# Patient Record
Sex: Male | Born: 1945 | Race: White | Hispanic: No | Marital: Married | Smoking: Never smoker
Health system: Southern US, Community
[De-identification: ages and names within clinical notes are randomized; demographics above are authoritative.]

## PROBLEM LIST (undated history)

## (undated) DIAGNOSIS — M25569 Pain in unspecified knee: Secondary | ICD-10-CM

## (undated) DIAGNOSIS — G4730 Sleep apnea, unspecified: Secondary | ICD-10-CM

## (undated) DIAGNOSIS — K469 Unspecified abdominal hernia without obstruction or gangrene: Secondary | ICD-10-CM

## (undated) DIAGNOSIS — I10 Essential (primary) hypertension: Secondary | ICD-10-CM

## (undated) DIAGNOSIS — I491 Atrial premature depolarization: Secondary | ICD-10-CM

## (undated) DIAGNOSIS — J45909 Unspecified asthma, uncomplicated: Secondary | ICD-10-CM

## (undated) DIAGNOSIS — K219 Gastro-esophageal reflux disease without esophagitis: Secondary | ICD-10-CM

## (undated) DIAGNOSIS — E785 Hyperlipidemia, unspecified: Secondary | ICD-10-CM

## (undated) DIAGNOSIS — G8929 Other chronic pain: Secondary | ICD-10-CM

## (undated) DIAGNOSIS — J302 Other seasonal allergic rhinitis: Secondary | ICD-10-CM

## (undated) DIAGNOSIS — G2581 Restless legs syndrome: Secondary | ICD-10-CM

## (undated) DIAGNOSIS — M1990 Unspecified osteoarthritis, unspecified site: Secondary | ICD-10-CM

## (undated) DIAGNOSIS — I2510 Atherosclerotic heart disease of native coronary artery without angina pectoris: Secondary | ICD-10-CM

## (undated) DIAGNOSIS — B029 Zoster without complications: Secondary | ICD-10-CM

## (undated) HISTORY — DX: Other seasonal allergic rhinitis: J30.2

## (undated) HISTORY — DX: Atrial premature depolarization: I49.1

## (undated) HISTORY — PX: KNEE SURGERY: SHX244

## (undated) HISTORY — DX: Hyperlipidemia, unspecified: E78.5

## (undated) HISTORY — DX: Other chronic pain: G89.29

## (undated) HISTORY — DX: Atherosclerotic heart disease of native coronary artery without angina pectoris: I25.10

## (undated) HISTORY — PX: JOINT REPLACEMENT: SHX530

## (undated) HISTORY — PX: CORONARY ANGIOPLASTY: SHX604

## (undated) HISTORY — PX: CARDIAC CATHETERIZATION: SHX172

## (undated) HISTORY — DX: Pain in unspecified knee: M25.569

## (undated) HISTORY — DX: Essential (primary) hypertension: I10

---

## 2004-04-24 ENCOUNTER — Ambulatory Visit (HOSPITAL_COMMUNITY): Admission: RE | Admit: 2004-04-24 | Discharge: 2004-04-24 | Payer: Self-pay | Admitting: Gastroenterology

## 2004-04-24 ENCOUNTER — Encounter (INDEPENDENT_AMBULATORY_CARE_PROVIDER_SITE_OTHER): Payer: Self-pay | Admitting: Specialist

## 2008-10-27 ENCOUNTER — Ambulatory Visit: Payer: Self-pay | Admitting: *Deleted

## 2008-10-27 ENCOUNTER — Inpatient Hospital Stay (HOSPITAL_COMMUNITY): Admission: EM | Admit: 2008-10-27 | Discharge: 2008-10-30 | Payer: Self-pay | Admitting: Emergency Medicine

## 2010-08-14 ENCOUNTER — Ambulatory Visit: Payer: Self-pay | Admitting: Cardiovascular Disease

## 2010-10-27 ENCOUNTER — Ambulatory Visit: Payer: Self-pay | Admitting: Cardiovascular Disease

## 2010-11-05 ENCOUNTER — Telehealth (INDEPENDENT_AMBULATORY_CARE_PROVIDER_SITE_OTHER): Payer: Self-pay | Admitting: Radiology

## 2010-11-06 ENCOUNTER — Ambulatory Visit: Payer: Self-pay | Admitting: Cardiovascular Disease

## 2010-11-06 ENCOUNTER — Encounter: Payer: Self-pay | Admitting: Cardiovascular Disease

## 2010-11-06 ENCOUNTER — Encounter (HOSPITAL_COMMUNITY)
Admission: RE | Admit: 2010-11-06 | Discharge: 2011-01-06 | Payer: Self-pay | Source: Home / Self Care | Attending: Cardiovascular Disease | Admitting: Cardiovascular Disease

## 2010-11-06 ENCOUNTER — Ambulatory Visit: Payer: Self-pay

## 2010-11-10 ENCOUNTER — Ambulatory Visit: Payer: Self-pay

## 2011-01-06 NOTE — Progress Notes (Signed)
Summary: nuc pre-procedure  Phone Note Outgoing Call   Call placed by: Domenic Polite, CNMT,  November 05, 2010 4:13 PM Call placed to: Patient Reason for Call: Confirm/change Appt Summary of Call: Left message with information on Myoview Information Sheet (see scanned document for details).      Nuclear Med Background Indications for Stress Test: Evaluation for Ischemia, Stent Patency, PTCA Patency   History: Heart Catheterization, Myocardial Perfusion Study, Stents  History Comments: '09 MPI Lat isch. EF=58%/ Cath/ Stents> LCFX  Symptoms: Chest Pain    Nuclear Pre-Procedure Cardiac Risk Factors: Family History - CAD, Lipids

## 2011-01-06 NOTE — Assessment & Plan Note (Signed)
Summary: Cardiology Nuclear Testing  Nuclear Med Background Indications for Stress Test: Evaluation for Ischemia, Stent Patency, PTCA Patency   History: Asthma, Heart Catheterization, Myocardial Perfusion Study, Stents  History Comments: '09 MPI Lat isch. EF=58%/ Cath/ Stents> LCFX  Symptoms: Chest Pain  Symptoms Comments: CP radiates to (R) shoulder-blade.   Nuclear Pre-Procedure Cardiac Risk Factors: Family History - CAD, Lipids Caffeine/Decaff Intake: None NPO After: 6:30 PM Lungs: Clear IV 0.9% NS with Angio Cath: 20g     IV Site: R Antecubital IV Started by: Irean Hong, RN Chest Size (in) 44     Height (in): 75 Weight (lb): 239 BMI: 29.98 Tech Comments: Held metoprolol 24 hrs. Metoprolol 50mg  1/2 tablet po given after exercise treadmill.  Nuclear Med Study 1 or 2 day study:  2 day     Stress Test Type:  Stress Reading MD:  Olga Millers, MD     Referring MD:  Kristeen Miss Resting Radionuclide:  Technetium 48m Tetrofosmin     Resting Radionuclide Dose:  33 mCi  Stress Radionuclide:  Technetium 33m Tetrofosmin     Stress Radionuclide Dose:  33 mCi   Stress Protocol Exercise Time (min):  7:16 min     Max HR:  148 bpm     Predicted Max HR:  154 bpm  Max Systolic BP: 247 mm Hg     Percent Max HR:  96.10 %     METS: 8.9 Rate Pressure Product:  08657    Stress Test Technologist:  Irean Hong,  RN     Nuclear Technologist:  Domenic Polite, CNMT  Rest Procedure  Myocardial perfusion imaging was performed at rest 45 minutes following the intravenous administration of Technetium 64m Tetrofosmin.  Stress Procedure  There is a RBBB on the baseline EKG today that wasn't present on the previous EKG. The patient exercised for 7 minutes and 16 seconds.  The patient stopped due to Marked HTN response. There were nonspecific  ST-T wave changes, rare PVC,PAC.  Technetium 53m Tetrofosmin was injected at peak exercise and myocardial perfusion imaging was performed after a brief  delay.  QPS Raw Data Images:  Normal; no motion artifact; normal heart/lung ratio. Stress Images:  Normal homogeneous uptake in all areas of the myocardium. Rest Images:  Normal homogeneous uptake in all areas of the myocardium. Subtraction (SDS):  Normal Transient Ischemic Dilatation:  0.87  (Normal <1.22)  Lung/Heart Ratio:  0.20  (Normal <0.45)  Quantitative Gated Spect Images QGS EDV:  105 ml QGS ESV:  40 ml QGS EF:  62 % QGS cine images:  normal  Findings Normal nuclear study      Overall Impression  Exercise Capacity: Fair exercise capacity. BP Response: Hypertensive blood pressure response. Clinical Symptoms: Dysnpnea ECG Impression: RBBB Overall Impression: Normal stress nuclear study.  Appended Document: Cardiology Nuclear Testing copy sent dr.nasher

## 2011-02-19 ENCOUNTER — Ambulatory Visit: Payer: Self-pay | Admitting: Cardiovascular Disease

## 2011-03-16 ENCOUNTER — Ambulatory Visit: Payer: Self-pay | Admitting: Cardiovascular Disease

## 2011-03-27 ENCOUNTER — Encounter: Payer: Self-pay | Admitting: *Deleted

## 2011-03-31 ENCOUNTER — Encounter: Payer: Self-pay | Admitting: Cardiovascular Disease

## 2011-03-31 ENCOUNTER — Ambulatory Visit (INDEPENDENT_AMBULATORY_CARE_PROVIDER_SITE_OTHER): Payer: Medicare Other | Admitting: Cardiovascular Disease

## 2011-03-31 VITALS — BP 132/90 | HR 68 | Ht 75.0 in | Wt 235.6 lb

## 2011-03-31 DIAGNOSIS — E785 Hyperlipidemia, unspecified: Secondary | ICD-10-CM | POA: Insufficient documentation

## 2011-03-31 DIAGNOSIS — I2510 Atherosclerotic heart disease of native coronary artery without angina pectoris: Secondary | ICD-10-CM

## 2011-03-31 MED ORDER — NITROGLYCERIN 0.4 MG SL SUBL: 0.4000 mg | SUBLINGUAL_TABLET | SUBLINGUAL | Status: DC | PRN

## 2011-03-31 NOTE — Assessment & Plan Note (Signed)
[name] is doing well from a cardiac standpoint. He's not had any episodes of angina. We will continue with his same medications.  I'll see him again in 6 months.

## 2011-03-31 NOTE — Progress Notes (Signed)
[name] Date of Birth  [birth year] [facility] / [facility] [address]     [address] [place], [place]  [address] [phone]  Fax  [phone]  History of Present Illness:  [name] is a middle-aged gentleman with a history of coronary artery disease. He status post PTCA and stenting of his ramus intermediate vessel in 2000 and. He's not had any episodes of angina. He works out on a treadmill everyday.  Current Outpatient Prescriptions on File Prior to Visit  Medication Sig Dispense Refill  . Ascorbic Acid (VITAMIN C) 500 MG tablet Take 500 mg by mouth once a week.       Marland Kitchen aspirin 81 MG tablet Take 81 mg by mouth daily.        . clopidogrel (PLAVIX) 75 MG tablet Take 75 mg by mouth daily.        . fish oil-omega-3 fatty acids 1000 MG capsule Take 2 g by mouth daily.        . metoprolol tartrate (LOPRESSOR) 25 MG tablet Take 25 mg by mouth 2 (two) times daily.        . nitroGLYCERIN (NITROSTAT) 0.4 MG SL tablet Place 0.4 mg under the tongue every 5 (five) minutes as needed.        . rosuvastatin (CRESTOR) 10 MG tablet Take 10 mg by mouth daily.       . naproxen sodium (ANAPROX) 220 MG tablet Take 220 mg by mouth 2 (two) times daily with a meal.          Allergies  Allergen Reactions  . Biaxin     Past Medical History  Diagnosis Date  . Coronary artery disease     PTCA/stenting ramus inter. vess.; 10/2008  . Hyperlipidemia   . Hypertension   . Chronic knee pain   . Seasonal allergies     Past Surgical History  Procedure Date  . Knee surgery     right and left  . Cardiac catheterization     10/30/2008;  PTCA/stent ramus interm. vess.    History  Smoking status  . Never Smoker   Smokeless tobacco  . Not on file    History  Alcohol Use  . Yes    No family history on file.  Reviw of Systems:  Reviewed in the HPI.  All other systems are negative.  Physical Exam: BP 132/90  Pulse 68  Ht 6\' 3"  (1.905 m)  Wt 235 lb 9.6 oz  (106.867 kg)  BMI 29.45 kg/m2 The patient is alert and oriented x 3.  The mood and affect are normal.  The skin is warm and dry.  Color is normal.  The HEENT exam reveals that the sclera are nonicteric.  The mucous membranes are moist.  The carotids are 2+ without bruits.  There is no thyromegaly.  There is no JVD.  The lungs are clear.  The chest wall is non tender.  The heart exam reveals a regular rate with a normal S1 and S2.  There are no murmurs, gallops, or rubs.  The PMI is not displaced.   Abdominal exam reveals good bowel sounds.  There is no guarding or rebound.  There is no hepatosplenomegaly or tenderness.  There are no masses.  Exam of the legs reveal no clubbing, cyanosis, or edema.  The legs are without rashes.  The distal pulses are intact.  Cranial nerves II - XII are intact.  Motor and sensory functions are intact.  The  gait is normal.  Assessment / Plan:

## 2011-03-31 NOTE — Assessment & Plan Note (Signed)
His most recent lipid levels have been  good. We'll check his lipids, basic metabolic profile, and hepatic profile in 6 months along with an office visit.

## 2011-04-21 NOTE — Discharge Summary (Signed)
NAMECYNTHIA, [name] NO.:  [id number]   MEDICAL RECORD NO.:  [id number]          PATIENT TYPE:  INP   LOCATION:  6524                         FACILITY:  [facility]   PHYSICIAN:  Vesta Mixer, M.D. DATE OF BIRTH:  [birth year]   DATE OF ADMISSION:  10/27/2008  DATE OF DISCHARGE:  10/30/2008                               DISCHARGE SUMMARY   DISCHARGE DIAGNOSES:  1. Coronary artery disease - status post percutaneous transluminal      coronary angioplasty and stenting of the ramus intermediate vessel.  2. Dyslipidemia.  3. Hypertension.   DISPOSITION:  The patient will see Dr. Elease Hashimoto in 1-2 weeks.  He is to  call for any further chest pains.   DISCHARGE MEDICATIONS:  1. Aspirin 81 mg a day.  2. Plavix 75 mg a day.  3. Hydrochlorothiazide 12.5 mg a day.  4. Lisinopril 10 mg a day.  5. Crestor 10 mg a day.  6. Metoprolol 25 mg twice daily.  7. Nitroglycerin 0.4 mg sublingually as needed facial once a day.   HISTORY:  1. [name] is a 65 year old gentleman who was recently seen for      episodes of chest pain.  He was originally scheduled for an      outpatient heart catheterization, but started having episodes of      chest discomfort.  He was admitted to the hospital.  He ruled out      for myocardial infarction.  He had heart catheterization, which      revealed a very tight ramus intermediate vessel.  A 2.5 x 18-cm      Promus stent was placed in the ramus intermediate vessel.  It was      postdilated using a 2.75 x 15 Quantum Monorail inflated up to 16      atmospheres.  He tolerated the procedure quite well and did not      have any further episodes of chest pain.  He will follow up with      Dr. Elease Hashimoto.  2. Knee pain.  The patient had a long history of chronic knee pain.      He had some knee stiffness after staying at      bed all day.  He normally sees Dr. Otelia Sergeant.  We will have him call      Dr. Otelia Sergeant in the next day or so for further evaluation.  He  will      not be able to have an MRI for the next 6 weeks because of the      recently placed stent.  I advised him to put some ice on his knee      and take some Motrin.      Vesta Mixer, M.D.  Electronically Signed     PJN/MEDQ  D:  10/30/2008  T:  10/30/2008  Job:  [id number]   cc:   Antony Madura, M.D.  Kerrin Champagne, M.D.

## 2011-04-21 NOTE — Cardiovascular Report (Signed)
NAME:  AVISH, TORRY NO.:  [id number]   MEDICAL RECORD NO.:  [id number]          PATIENT TYPE:  INP   LOCATION:  6524                         FACILITY:  [facility]   PHYSICIAN:  Vesta Mixer, M.D. DATE OF BIRTH:  18-Nov-1946   DATE OF PROCEDURE:  10/29/2008  DATE OF DISCHARGE:                            CARDIAC CATHETERIZATION   [name] is a middle-aged gentleman who was recently seen for  episodes of chest pain.  He had an abnormal Cardiolite study and was  scheduled for heart catheterization.  He had chest pain prior to being  admitted for his elective heart catheterization and was admitted for  further evaluation.  He ruled out for myocardial infarction.   The procedure was left heart catheterization with coronary angiography  with PTCA and stenting of the ramus intermediate vessel.   The right femoral artery was easily cannulated using a modified  Seldinger technique.   HEMODYNAMICS:  LV pressure is 136/8 with an aortic pressure of 140/95.   ANGIOGRAPHY:  Left main:  The left main is smooth and normal and is very  large.   The left anterior descending artery has a mild stenosis at its origin of  about 30%.  It does not appear to be flow obstructed.  There are minor  luminal irregularities throughout the LAD, but no critical stenosis.  It  gives off a moderate-to-large-sized first diagonal artery which is  essentially normal.  The distal LAD reaches the apex and is  unremarkable.   The ramus intermediate vessel is a moderate-sized vessel.  It has a 90%  eccentric stenosis at its midpoint.   The left circumflex artery is very large and is dominant.  There are  minor luminal irregularities.  There are no significant marginal  branches, but there is a large posterolateral branch which is normal.   The right coronary artery is small and is nondominant.   The left ventriculogram was performed in a 30 RAO position.  It reveals  normal left ventricular  systolic function with ejection fraction of 65%.   ANGIOPLASTY:  The 5-French sheath was traded out for 6-French sheath.  Angiomax was given IV.  The ACT was 333.  A Judkins left 4 guide was  positioned into the left main.  A Prowater angioplasty wire was placed  down into the distal ramus intermediate vessel.  A 2.25 x 15-mm apex  balloon was positioned across the stenosis and was inflated twice at 10  atmospheres.  The balloon was fully dilated, but this lesion stayed  mostly unchanged.  It was guessed that this was mostly an elastic  lesion.   At this point, a 2.5 x 18-mm Promus stent was positioned across the  stenosis.  It was deployed at 14 atmospheres for 20 seconds.   Post-stent dilatation was achieved using a 2.75 x 15-mm Quantum  Monorail.  It was positioned distally and inflated up to 16 atmospheres  and then pulled back proximally and inflated up to 16 atmospheres each  for about 20 seconds.  This gave [place] a very nice angiographic result.  There was  no residual stenosis.  There were no complications.  The  patient is stable leaving the cath lab.      Vesta Mixer, M.D.  Electronically Signed     PJN/MEDQ  D:  10/29/2008  T:  10/29/2008  Job:  [id number]

## 2011-04-21 NOTE — H&P (Signed)
NAME:  [name], [name] NO.:  [id number]   MEDICAL RECORD NO.:  [id number]          PATIENT TYPE:  INP   LOCATION:                               FACILITY:  [facility]   PHYSICIAN:  Jennelle Human. Marisue Humble, MD DATE OF BIRTH:  01-12-1946   DATE OF ADMISSION:  10/27/2008  DATE OF DISCHARGE:                              HISTORY & PHYSICAL   PRIMARY CARDIOLOGIST:  Vesta Mixer, M.D.   PRIMARY CARE PHYSICIAN:  Antony Madura, M.D.   CHIEF COMPLAINT:  Chest pain.   HISTORY OF PRESENT ILLNESS:  This is a 65 year old gentleman with a  history of hyperlipidemia who is a new patient of Dr. Harvie Bridge after he  presented to Dr. Elease Hashimoto with complaints of chest pain.  He recently just  had a myocardial perfusion study performed on October 24, 2008, which  was abnormal and he was thus scheduled for a left heart cath on Monday  at 11 o'clock.  Today, however, he began having some intermittent pain  that was across his chest that came on at night.  He notes that usually  his pain mostly does occur at night.  He described it as 7/10 in  intensity and a tightness all the way across his precordium.  It was  associated with shortness of breath and dizziness.  However, he had no  diaphoresis or nausea and vomiting.  He took a couple of nitroglycerins  at home which seemed to help, but did not completely relieve the pain.  When he arrived to the emergency department, he was given nitroglycerin  and Dilaudid in the ER and is currently chest pain free.   PAST MEDICAL HISTORY:  1. Hyperlipidemia.  2. Bilateral knee surgeries, not otherwise specified.   ALLERGIES:  He is allergic to some unknown antibiotics.   MEDICATIONS:  1. Fish oil 1200 mg daily.  2. Aspirin 81 mg daily.  3. Hydrochlorothiazide 12.5 mg daily.  4. Lisinopril 10 mg daily.  5. Simvastatin 20 mg daily.  6. Allegra 1 tablet daily.  7. Vitamin C 1 tablet daily.  8. Calcium 1 tablet daily.   SOCIAL HISTORY:  Lives in  [place] with his wife.  He has 2 grown  children.  No tobacco, but occasional alcohol use.  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  He  used to regularly exercise before he started having discomfort.  He  would swim approximately 1-3 miles per week.   FAMILY HISTORY:  Significant for his father having died from a  myocardial infarction at the age of 33.  His mother is 31 and healthy.  He has no siblings.   REVIEW OF SYSTEMS:  Negative x2 and except for that stated in the HPI.   PHYSICAL EXAMINATION:  VITAL SIGNS:  He is afebrile, pulse is 65,  respiratory rate is 18, and blood pressure is 118/80.  GENERAL:  Alert, awake, and oriented, in no acute distress.  HEENT:  Normal.  NECK:  No jugular venous distention.  No bruits, is otherwise supple.  CHEST:  Clear to auscultation bilaterally.  CARDIOVASCULAR:  Regular rate and rhythm without murmurs, gallops, or  rubs.  PMI is normal.  ABDOMEN:  Soft, nondistended, and nontender.  Good bowel sounds.  No  hepatosplenomegaly.  There is no hepatojugular reflux.  EXTREMITIES:  No clubbing, cyanosis, or edema.  Dorsalis pedis and  posterior tibial pulses are 2+.   Chest x-ray is within normal limits.   ECG shows normal sinus rhythm without any ST or T wave changes  consistent with ischemia.  There is an RSR prime pattern and V1  consistent with right ventricular conduction delay as the QRS duration  is less than 90 milliseconds.   LABORATORY EXAM:  Cardiac enzymes are negative x1.  White count is 9.8,  H&H is 14.9 and 42.9 respectively, platelets are 169.  Sodium is 139  with a potassium of 3.2, chloride 103, bicarb 25, BUN 17, creatinine  0.95, and glucose is 122.   ASSESSMENT AND PLAN:  1. Chest pain with a recently abnormal myocardial perfusion study.  We      will treat him for accelerating angina with a heparin drip,      aspirin, statin, the initiation of beta-blockade, and we will  place      1 inch of nitroglycerin paste, although he is currently pain free      at this time.  We will cycle cardiac enzymes and schedule him for a      cardiac catheterization on Monday as he is already scheduled as an      outpatient at 11 o'clock.  2. Hyperlipidemia.  We will follow fasting lipid panel in the morning.  3. Hypokalemia.  He is mildly hypokalemic at 3.2.  We will supplement      his potassium.      Jennelle Human Marisue Humble, MD  Electronically Signed     GBS/MEDQ  D:  10/27/2008  T:  10/27/2008  Job:  [id number]

## 2011-04-21 NOTE — H&P (Signed)
NAME:  [name], [name] NO.:  [id number]   MEDICAL RECORD NO.:  [id number]          PATIENT TYPE:  OUT   LOCATION:                               FACILITY:  [facility]   PHYSICIAN:  Vesta Mixer, M.D. DATE OF BIRTH:  [birth year]   DATE OF ADMISSION:  10/29/2008  DATE OF DISCHARGE:                              HISTORY & PHYSICAL   [name] is a 65 year old gentleman with a history of  allergies and hypercholesterolemia.  He recently saw me for chest pain  and had an abnormal stress Cardiolite study.  He is now admitted for  heart catheterization for further evaluation of this abnormal stress  Cardiolite study.   [name] has been in relatively good health.  He has been under a lot  of stress recently because he lost his job.  He started having some  episodes of chest pain.  The chest pain is intermittent.  It is  described as a tightness in his neck and radiates up to his shoulders.  Lasts for about 45 seconds to 1 minute and is typically relieved with  rest.  He has noticed that it seems to get a little bit worse with  exercise.  He has also had some dyspnea with exercise.  He denies any  syncope or presyncope.  He has tried a nitroglycerin, but it really did  not seem to help.   CURRENT MEDICATIONS:  1. Multivitamin once a day.  2. Vitamin E, C, and D once a day.  3. Zocor 20 mg a day.  4. Allegra once a day.  5. Lisinopril 10 mg a day starting yesterday.  6. Hydrochlorothiazide 12.5 mg a day.  7. Aspirin once a day starting yesterday.   He is allergic to [facility].   PAST MEDICAL HISTORY:  1. Hypercholesterolemia.  2. Recent chest pains with an abnormal stress Cardiolite study.   SOCIAL HISTORY:  The patient does not smoke.  He drinks some beer and  wine.  He drinks several caffeinated drinks a day.   FAMILY HISTORY:  Father died at age 16 due to COPD.  His father also had  angina.  His mother is 65 years old and lives in a nursing home.   REVIEW OF SYSTEMS:  Reviewed and is essentially negative except as noted  in the HPI.   PHYSICAL EXAMINATION:  GENERAL:  He is a middle-aged gentleman in no  acute distress.  He is alert and oriented x3 and his mood and affect are  normal.  VITAL SIGNS:  His weight is 238, blood pressure is 112/60 with a heart  rate of 78.  HEENT:  2+ carotids.  He has no bruits.  No JVD.  No thyromegaly.  NECK:  Supple.  There is no lymphadenopathy.  LUNGS:  Clear.  His chest expansion is normal.  HEART:  Regular rate.  S1 and S2.  He has no chest wall tenderness.  ABDOMINAL:  Good bowel sounds and is nontender.  He has no  hepatosplenomegaly.  EXTREMITIES:  He has good pulses.  He has no clubbing, cyanosis,  or  edema.  SKIN:  His skin turgor is normal.  His skin is without rashes.  His gait  is normal.   [name] presents with some episodes of chest discomfort and an abnormal  stress Cardiolite study suggesting lateral ischemia.  I have recommended  that we proceed with heart catheterization.  We will discuss the risks,  benefits, and options of heart catheterization.  He understands and  agrees to proceed.  We will schedule him for Monday, October 29, 2008.      Vesta Mixer, M.D.  Electronically Signed     PJN/MEDQ  D:  10/25/2008  T:  10/26/2008  Job:  [id number]   cc:   Antony Madura, M.D.

## 2011-04-24 NOTE — Op Note (Signed)
NAME:  [name], DOLLINS NO.:  [id number]   MEDICAL RECORD NO.:  [id number]                   PATIENT TYPE:  AMB   LOCATION:  ENDO                                 FACILITY:  [facility]   PHYSICIAN:  Graylin Shiver, M.D.                DATE OF BIRTH:  1946-10-06   DATE OF PROCEDURE:  04/24/2004  DATE OF DISCHARGE:                                 OPERATIVE REPORT   PROCEDURE:  Colonoscopy with biopsy.   INDICATIONS FOR PROCEDURE:  Screening.   CONSENT:  Informed consent was obtained after explanation of the risks of  bleeding, infection, and perforation.   PREMEDICATION:  Fentanyl 75 mcg IV, Versed 8 mg IV.   DESCRIPTION OF PROCEDURE:  With the patient in the left lateral decubitus  position, the rectal exam was performed.  No masses were felt.  The Olympus  colonoscope was inserted into the rectum and advanced around the colon to  the cecum.  Cecal landmarks were identified.  The cecum was normal.  In the  ascending colon there was a small 3 mm polyp, biopsied off with cold  forceps.  The transverse colon looked normal.  The descending colon looked  normal.  The sigmoid colon revealed two small 3 mm polyps, biopsied off with  cold forceps.  The rectum looked normal.  He tolerated the procedure well  without complications.   IMPRESSION:  Colon polyps.  211.3.   PLAN:  The pathology will be checked.                                               Graylin Shiver, M.D.    [name]  D:  04/24/2004  T:  04/24/2004  Job:  [id number]   cc:   Antony Madura, M.D.  [address]  [place]  [place] [address]  Fax: [phone]

## 2011-05-26 ENCOUNTER — Telehealth: Payer: Self-pay | Admitting: Cardiovascular Disease

## 2011-05-26 NOTE — Telephone Encounter (Signed)
Patient is concerned about his prescription he picked up yesterday. He thinks that he has the wrong medication.

## 2011-05-26 NOTE — Telephone Encounter (Signed)
Pt was given generic plavix, pt ok with it, liked the cheaper price.

## 2011-09-09 LAB — POCT CARDIAC MARKERS
Myoglobin, poc: 110
Troponin i, poc: <0.05

## 2011-09-09 LAB — LIPID PANEL
Cholesterol: 175
HDL: 43
Total CHOL/HDL Ratio: 4.1

## 2011-09-09 LAB — DIFFERENTIAL
Basophils Relative: 2 — ABNORMAL HIGH
Eosinophils Absolute: 0.4
Lymphocytes Relative: 40
Lymphs Abs: 4
Monocytes Absolute: 1.1 — ABNORMAL HIGH
Monocytes Relative: 11
Neutro Abs: 4.2
Neutrophils Relative %: 43

## 2011-09-09 LAB — CBC
HCT: 44
Hemoglobin: 14.4
Hemoglobin: 14.9
MCHC: 33.5
MCHC: 33.7
MCV: 94.3
MCV: 94.7
MCV: 95.4
RBC: 4.51
RBC: 4.59
RBC: 4.65
RDW: 13.3
WBC: 11.5 — ABNORMAL HIGH
WBC: 8.5
WBC: 9

## 2011-09-09 LAB — BASIC METABOLIC PANEL
BUN: 17
CO2: 23
Calcium: 9.2
Chloride: 103
Chloride: 107
Creatinine, Ser: 0.95
GFR calc Af Amer: >60
GFR calc Af Amer: >60
GFR calc non Af Amer: >60
Sodium: 136
Sodium: 137

## 2011-09-09 LAB — HEPARIN LEVEL (UNFRACTIONATED)
Heparin Unfractionated: 0.71 — ABNORMAL HIGH
Heparin Unfractionated: 0.89 — ABNORMAL HIGH

## 2011-09-09 LAB — CARDIAC PANEL(CRET KIN+CKTOT+MB+TROPI)
CK, MB: 6.2 — ABNORMAL HIGH
Relative Index: 3.1 — ABNORMAL HIGH
Troponin I: 0.01

## 2011-09-09 LAB — PROTIME-INR: INR: 1

## 2011-11-07 DIAGNOSIS — B029 Zoster without complications: Secondary | ICD-10-CM

## 2011-11-07 HISTORY — DX: Zoster without complications: B02.9

## 2011-12-09 ENCOUNTER — Other Ambulatory Visit: Payer: Self-pay | Admitting: *Deleted

## 2011-12-09 MED ORDER — METOPROLOL TARTRATE 25 MG PO TABS: 25.0000 mg | ORAL_TABLET | Freq: Two times a day (BID) | ORAL | Status: DC

## 2011-12-09 NOTE — Telephone Encounter (Signed)
Fax Received. Refill Completed. [name] (R.M.A)   

## 2012-01-12 ENCOUNTER — Ambulatory Visit (INDEPENDENT_AMBULATORY_CARE_PROVIDER_SITE_OTHER): Payer: Medicare Other | Admitting: Cardiovascular Disease

## 2012-01-12 ENCOUNTER — Encounter: Payer: Self-pay | Admitting: Cardiovascular Disease

## 2012-01-12 VITALS — BP 149/86 | HR 63 | Ht 75.0 in | Wt 240.0 lb

## 2012-01-12 DIAGNOSIS — E785 Hyperlipidemia, unspecified: Secondary | ICD-10-CM

## 2012-01-12 DIAGNOSIS — I2510 Atherosclerotic heart disease of native coronary artery without angina pectoris: Secondary | ICD-10-CM

## 2012-01-12 LAB — BASIC METABOLIC PANEL
BUN: 13 mg/dL (ref 6–23)
Calcium: 9.1 mg/dL (ref 8.4–10.5)
GFR: 70.18 mL/min (ref >60.00)
Potassium: 3.8 mEq/L (ref 3.5–5.1)

## 2012-01-12 LAB — HEPATIC FUNCTION PANEL
ALT: 31 U/L (ref 0–53)
AST: 28 U/L (ref 0–37)
Bilirubin, Direct: 0.1 mg/dL (ref 0.0–0.3)
Total Protein: 6.8 g/dL (ref 6.0–8.3)

## 2012-01-12 LAB — LIPID PANEL
Cholesterol: 171 mg/dL (ref 0–200)
VLDL: 23.8 mg/dL (ref 0.0–40.0)

## 2012-01-12 MED ORDER — ATORVASTATIN CALCIUM 20 MG PO TABS: 20.0000 mg | ORAL_TABLET | Freq: Every day | ORAL | Status: DC

## 2012-01-12 MED ORDER — NITROGLYCERIN 0.4 MG SL SUBL: 0.4000 mg | SUBLINGUAL_TABLET | SUBLINGUAL | Status: DC | PRN

## 2012-01-12 NOTE — Patient Instructions (Signed)
Your physician recommends that you return for a FASTING lipid profile: TODAY AND IN THREE MONTHS DUE TO NEW MEDICATION AND TO MAKE SURE NEW MEDICATION IS AGREEING WITH YOU.  Your physician wants you to follow-up in: 1 YEARYou will receive a reminder letter in the mail two months in advance. If you don't receive a letter, please call our office to schedule the follow-up appointment.  Your physician has recommended you make the following change in your medication:   STOP CRESTOR  START ATORVASTATIN 20 MG EACH EVENING

## 2012-01-12 NOTE — Progress Notes (Signed)
    [name] Date of Birth  [birth year] [facility]     [place] [facility]  [address]    [address]   [address] [place], [place]  [address]    [place], [place]  [address] [phone]  Fax  [phone]  [phone]  Fax [phone]   Problem list: 1. Coronary artery disease-status post 2.5 x 18 mm Promus stent positioned in the ramus intermediate vessel (10/29/08) 2. Hyperlipidemia 3. RBBB  History of Present Illness:   [name] is a 66 yo with a hx of CAD.  He is exercising regularly.  No angina  Current Outpatient Prescriptions on File Prior to Visit  Medication Sig Dispense Refill  . Ascorbic Acid (VITAMIN C) 500 MG tablet Take 500 mg by mouth once a week.       Marland Kitchen aspirin 81 MG tablet Take 81 mg by mouth daily.        . clopidogrel (PLAVIX) 75 MG tablet Take 75 mg by mouth daily.        Marland Kitchen Fexofenadine HCl (ALLEGRA PO) Take by mouth daily.        . fish oil-omega-3 fatty acids 1000 MG capsule Take 2 g by mouth daily.        . metoprolol tartrate (LOPRESSOR) 25 MG tablet Take 1 tablet (25 mg total) by mouth 2 (two) times daily.  60 tablet  2  . nitroGLYCERIN (NITROSTAT) 0.4 MG SL tablet Place 1 tablet (0.4 mg total) under the tongue every 5 (five) minutes as needed.  25 tablet  12  . rosuvastatin (CRESTOR) 10 MG tablet Take 10 mg by mouth daily.         Allergies  Allergen Reactions  . Biaxin     Past Medical History  Diagnosis Date  . Coronary artery disease     PTCA/stenting ramus inter. vess.; 10/2008  . Hyperlipidemia   . Hypertension   . Chronic knee pain   . Seasonal allergies     Past Surgical History  Procedure Date  . Knee surgery     right and left  . Cardiac catheterization     10/30/2008;  PTCA/stent ramus interm. vess.    History  Smoking status  . Never Smoker   Smokeless tobacco  . Not on file    History  Alcohol Use  . Yes    No family history on file.  Reviw of Systems:  Reviewed in the HPI.  All other  systems are negative.  Physical Exam: Blood pressure 149/86, pulse 63, height 6\' 3"  (1.905 m), weight 240 lb (108.863 kg). General: Well developed, well nourished, in no acute distress.  Head: Normocephalic, atraumatic, sclera non-icteric, mucus membranes are moist,   Neck: Supple. Negative for carotid bruits. JVD not elevated.  Lungs: Clear bilaterally to auscultation without wheezes, rales, or rhonchi. Breathing is unlabored.  Heart: RRR with S1 S2. No murmurs, rubs, or gallops appreciated.  Abdomen: Soft, non-tender, non-distended with normoactive bowel sounds. No hepatomegaly. No rebound/guarding. No obvious abdominal masses.  Msk:  Strength and tone appear normal for age.  Extremities: No clubbing or cyanosis. No edema.  Distal pedal pulses are 2+ and equal bilaterally.  Neuro: Alert and oriented X 3. Moves all extremities spontaneously.  Psych:  Responds to questions appropriately with a normal affect.  ECG: Sinus rhythm. He has a right bundle branch block. There are no significant changes from his previous EKGs.  Assessment / Plan:

## 2012-01-12 NOTE — Assessment & Plan Note (Signed)
[name] is doing very well. We'll continue with his same medications. Please is not having any episodes of angina. He's exercising on a fairly regular basis.

## 2012-01-13 ENCOUNTER — Telehealth: Payer: Self-pay | Admitting: Cardiovascular Disease

## 2012-01-13 NOTE — Telephone Encounter (Signed)
New Problem:     Patient was switched form Crestor to atorvastatin (LIPITOR) 20 MG tablet and he would like to kniow when he should start taking his Lipitor.  He would also like to know what to do with the rest of his recent refill of his Crestor. Please call back after 12:30-1:00pm.

## 2012-01-13 NOTE — Assessment & Plan Note (Signed)
His most recent cholesterol levels are still mildly elevated.  Will increase his Lipitor to 40 mg a day

## 2012-01-13 NOTE — Telephone Encounter (Signed)
Pt will stay on Crestor for a couple more days until he is able to get the new med filled.  He was notified that pharmacies do not take returns on medication.

## 2012-01-18 ENCOUNTER — Other Ambulatory Visit: Payer: Self-pay | Admitting: *Deleted

## 2012-01-18 ENCOUNTER — Telehealth: Payer: Self-pay | Admitting: *Deleted

## 2012-01-18 MED ORDER — CLOPIDOGREL BISULFATE 75 MG PO TABS: 75.0000 mg | ORAL_TABLET | Freq: Every day | ORAL | Status: DC

## 2012-01-18 NOTE — Telephone Encounter (Signed)
PT EXPLAINED HE HAS NOT SWITCHED HIS CHOL MEDS YET, HE IS CONTINUING HIS CRESTOR 10 MG TILL SHIPMENT OF LIPITOR 20 MG ARRIVES. NO MED CHANGE MADE YET, LAB REDRAW SET FOR 04/2012,  DR NAHSER INFORMED.

## 2012-01-18 NOTE — Telephone Encounter (Signed)
F/U  Patient returning Nurse call regarding Crestor questions, please return call to patient at [phone]

## 2012-01-18 NOTE — Telephone Encounter (Signed)
Message copied by Antony Odea on Mon Jan 18, 2012  4:52 PM ------      Message from: Alta, Deloris Ping      Created: Tue Jan 12, 2012  7:26 PM       LDL is a bit elevated. Continue to work on diet. Increase lipitor to 40 mg a day

## 2012-03-07 ENCOUNTER — Other Ambulatory Visit: Payer: Self-pay | Admitting: *Deleted

## 2012-03-07 MED ORDER — METOPROLOL TARTRATE 25 MG PO TABS: 25.0000 mg | ORAL_TABLET | Freq: Two times a day (BID) | ORAL | Status: DC

## 2012-03-07 NOTE — Telephone Encounter (Signed)
Fax Received. Refill Completed. Jae Skeet Chowoe (R.M.A)   

## 2012-03-08 ENCOUNTER — Other Ambulatory Visit: Payer: Self-pay | Admitting: *Deleted

## 2012-04-08 ENCOUNTER — Telehealth: Payer: Self-pay | Admitting: Cardiovascular Disease

## 2012-04-08 NOTE — Telephone Encounter (Signed)
New msg Pt called to say that he had blood work at Dr Burton Apley office [phone]. He canceled bw here for [id number]

## 2012-04-08 NOTE — Telephone Encounter (Signed)
Will forward to Dr. Harvie Bridge nurse for Lorain Childes

## 2012-04-11 ENCOUNTER — Other Ambulatory Visit: Payer: Self-pay | Admitting: *Deleted

## 2012-04-11 NOTE — Telephone Encounter (Signed)
noted 

## 2012-04-12 ENCOUNTER — Other Ambulatory Visit: Payer: Self-pay | Admitting: *Deleted

## 2012-04-12 ENCOUNTER — Other Ambulatory Visit: Payer: Medicare Other

## 2012-04-12 NOTE — Telephone Encounter (Signed)
Opened in Error.

## 2012-05-05 ENCOUNTER — Telehealth: Payer: Self-pay | Admitting: Cardiovascular Disease

## 2012-05-05 NOTE — Telephone Encounter (Signed)
Pt having knee surgery about the middle of august and he wants to talk to you about this and what he needs to do and also he needs to stop blood thinner 5day prior

## 2012-05-06 NOTE — Telephone Encounter (Signed)
PT NOT AT HOME, WILL TRY AGAIN LATER.

## 2012-05-06 NOTE — Telephone Encounter (Signed)
Informed pt the orthopaedic will request med to be stopped and we will fax

## 2012-05-06 NOTE — Telephone Encounter (Signed)
Patient returning nurse call he can be reached at [phone] or [phone]

## 2012-05-12 ENCOUNTER — Encounter: Payer: Self-pay | Admitting: *Deleted

## 2012-07-04 ENCOUNTER — Encounter (HOSPITAL_COMMUNITY): Payer: Self-pay | Admitting: Pharmacy Technician

## 2012-07-06 ENCOUNTER — Encounter (HOSPITAL_COMMUNITY)
Admission: RE | Admit: 2012-07-06 | Discharge: 2012-07-06 | Disposition: A | Discharge status: Home / Self Care | Payer: Medicare Other | Source: Ambulatory Visit | Attending: Orthopedic Surgery | Admitting: Orthopedic Surgery

## 2012-07-06 ENCOUNTER — Encounter (HOSPITAL_COMMUNITY): Payer: Self-pay

## 2012-07-06 ENCOUNTER — Ambulatory Visit (HOSPITAL_COMMUNITY)
Admission: RE | Admit: 2012-07-06 | Discharge: 2012-07-06 | Disposition: A | Discharge status: Home / Self Care | Payer: Medicare Other | Source: Ambulatory Visit | Attending: Orthopedic Surgery | Admitting: Orthopedic Surgery

## 2012-07-06 ENCOUNTER — Other Ambulatory Visit (HOSPITAL_COMMUNITY): Payer: Medicare Other

## 2012-07-06 DIAGNOSIS — G4730 Sleep apnea, unspecified: Secondary | ICD-10-CM

## 2012-07-06 DIAGNOSIS — Z01812 Encounter for preprocedural laboratory examination: Secondary | ICD-10-CM | POA: Insufficient documentation

## 2012-07-06 DIAGNOSIS — Z01818 Encounter for other preprocedural examination: Secondary | ICD-10-CM | POA: Insufficient documentation

## 2012-07-06 HISTORY — DX: Sleep apnea, unspecified: G47.30

## 2012-07-06 HISTORY — DX: Gastro-esophageal reflux disease without esophagitis: K21.9

## 2012-07-06 HISTORY — DX: Zoster without complications: B02.9

## 2012-07-06 HISTORY — DX: Unspecified asthma, uncomplicated: J45.909

## 2012-07-06 HISTORY — DX: Unspecified osteoarthritis, unspecified site: M19.90

## 2012-07-06 HISTORY — DX: Unspecified abdominal hernia without obstruction or gangrene: K46.9

## 2012-07-06 LAB — CBC
MCH: 32.2 pg (ref 26.0–34.0)
MCHC: 34.9 g/dL (ref 30.0–36.0)
MCV: 92.2 fL (ref 78.0–100.0)
Platelets: 153 10*3/uL (ref 150–400)
RDW: 13.2 % (ref 11.5–15.5)

## 2012-07-06 LAB — URINALYSIS, ROUTINE W REFLEX MICROSCOPIC
Bilirubin Urine: NEGATIVE
Glucose, UA: NEGATIVE mg/dL
Hgb urine dipstick: NEGATIVE
Ketones, ur: NEGATIVE mg/dL
Protein, ur: NEGATIVE mg/dL

## 2012-07-06 LAB — BASIC METABOLIC PANEL
CO2: 27 mEq/L (ref 19–32)
Calcium: 9.5 mg/dL (ref 8.4–10.5)
Creatinine, Ser: 1.02 mg/dL (ref 0.50–1.35)
GFR calc non Af Amer: 74 mL/min — ABNORMAL LOW (ref >90)
Glucose, Bld: 97 mg/dL (ref 70–99)

## 2012-07-06 LAB — SURGICAL PCR SCREEN: Staphylococcus aureus: NEGATIVE

## 2012-07-06 NOTE — Patient Instructions (Addendum)
YOUR SURGERY IS SCHEDULED ON:  Tuesday  8/20  AT 7:15 AM  REPORT TO [facility] AT:  5:15 AM      PHONE # FOR SHORT STAY IS [phone]  DO NOT EAT OR DRINK ANYTHING AFTER MIDNIGHT THE NIGHT BEFORE YOUR SURGERY.  YOU MAY BRUSH YOUR TEETH, RINSE OUT YOUR MOUTH--BUT NO WATER, NO FOOD, NO CHEWING GUM, NO MINTS, NO CANDIES, NO CHEWING TOBACCO.  PLEASE TAKE THE FOLLOWING MEDICATIONS THE AM OF YOUR SURGERY WITH A FEW SIPS OF WATER:    METOPROLOL, BENADRYL,  BRING NITROGLYCERIN    IF YOU USE INHALERS--USE YOUR INHALERS THE AM OF YOUR SURGERY AND BRING INHALERS TO THE HOSPITAL -TAKE TO SURGERY.    IF YOU ARE DIABETIC:  DO NOT TAKE ANY DIABETIC MEDICATIONS THE AM OF YOUR SURGERY.  IF YOU TAKE INSULIN IN THE EVENINGS--PLEASE ONLY TAKE 1/2 NORMAL EVENING DOSE THE NIGHT BEFORE YOUR SURGERY.  NO INSULIN THE AM OF YOUR SURGERY.  IF YOU HAVE SLEEP APNEA AND USE CPAP OR BIPAP--PLEASE BRING THE MASK --NOT THE MACHINE-NOT THE TUBING   -JUST THE MASK. DO NOT BRING VALUABLES, MONEY, CREDIT CARDS.  CONTACT LENS, DENTURES / PARTIALS, GLASSES SHOULD NOT BE WORN TO SURGERY AND IN MOST CASES-HEARING AIDS WILL NEED TO BE REMOVED.  BRING YOUR GLASSES CASE, ANY EQUIPMENT NEEDED FOR YOUR CONTACT LENS. FOR PATIENTS ADMITTED TO THE HOSPITAL--CHECK OUT TIME THE DAY OF DISCHARGE IS 11:00 AM.  ALL INPATIENT ROOMS ARE PRIVATE - WITH BATHROOM, TELEPHONE, TELEVISION AND WIFI INTERNET. IF YOU ARE BEING DISCHARGED THE SAME DAY OF YOUR SURGERY--YOU CAN NOT DRIVE YOURSELF HOME--AND SHOULD NOT GO HOME ALONE BY TAXI OR BUS.  NO DRIVING OR OPERATING MACHINERY FOR 24 HOURS FOLLOWING ANESTHESIA / PAIN MEDICATIONS.                            SPECIAL INSTRUCTIONS:  CHLORHEXIDINE SOAP SHOWER (other brand names are Betasept and Hibiclens ) PLEASE SHOWER WITH CHLORHEXIDINE THE NIGHT BEFORE YOUR SURGERY AND THE AM OF YOUR SURGERY. DO NOT USE CHLORHEXIDINE ON YOUR FACE OR PRIVATE AREAS--YOU MAY USE YOUR NORMAL SOAP THOSE AREAS  AND YOUR NORMAL SHAMPOO.  WOMEN SHOULD AVOID SHAVING UNDER ARMS AND SHAVING LEGS 48 HOURS BEFORE USING CHLORHEXIDINE TO AVOID SKIN IRRITATION.  DO NOT USE IF ALLERGIC TO CHLORHEXIDINE.  PLEASE READ OVER ANY  FACT SHEETS THAT YOU WERE GIVEN: MRSA INFORMATION, BLOOD TRANSFUSION INFORMATION, INCENTIVE SPIROMETER INFORMATION.   PT CALLED AND NOTIFIED SURGERY TIME IS NOW 9:00 AM --PLEASE ARRIVE BY 6:30 AM.

## 2012-07-06 NOTE — Pre-Procedure Instructions (Signed)
CBC, BMET, PT, PTT, UA AND CXR -WERE DONE TODAY - PREOP - AT [facility].  T/S WILL BE DRAWN THE DAY OF SURGERY. PT HAS EKG REPORT 01/12/12 AND CARDIOLOGY OFFICE NOTE FROM DR. NAHSER -REPORTS IN EPIC. PREOP TEACHING WAS DONE USING TEACH BACK METHOD.

## 2012-07-06 NOTE — Progress Notes (Signed)
07/06/12 1200  OBSTRUCTIVE SLEEP APNEA  Have you ever been diagnosed with sleep apnea through a sleep study? No  Do you snore loudly (loud enough to be heard through closed doors)?  1  Do you often feel tired, fatigued, or sleepy during the daytime? 0  Has anyone observed you stop breathing during your sleep? 0  Do you have, or are you being treated for high blood pressure? 1  BMI more than 35 kg/m2? 0  Age over 68 years old? 1  Neck circumference greater than 40 cm/18 inches? 0  Gender: 1  Obstructive Sleep Apnea Score 4   Score 4 or greater  Updated health history

## 2012-07-07 ENCOUNTER — Telehealth: Payer: Self-pay | Admitting: *Deleted

## 2012-07-07 NOTE — Telephone Encounter (Signed)
Cardiac clearance signed and faxed back to [facility] for LTKA, pt can hold plavix 5 days prior.

## 2012-07-13 ENCOUNTER — Other Ambulatory Visit: Payer: Self-pay | Admitting: Cardiovascular Disease

## 2012-07-13 NOTE — Progress Notes (Signed)
H&P performed 07/13/12 Dictation # [id number]

## 2012-07-13 NOTE — Telephone Encounter (Signed)
Fax Received. Refill Completed. [name] (R.M.A)   

## 2012-07-14 NOTE — H&P (Signed)
[name], [name] NO.:  [id number]  MEDICAL RECORD NO.:  [id number]  LOCATION:                               FACILITY:  [facility]  PHYSICIAN:  Madlyn Frankel. Charlann Boxer, M.D.  DATE OF BIRTH:  1946-03-01  DATE OF ADMISSION:  07/26/2012 DATE OF DISCHARGE:                             HISTORY & PHYSICAL   ADMITTING DIAGNOSIS:  End-stage osteoarthritis, left knee.  HISTORY OF PRESENT ILLNESS:  This is a 66 year old gentleman with a history of end-stage osteoarthritis of his left knee that has failed conservative treatment.  After discussion of treatment, benefits, risks, and options, the patient is now scheduled for total knee arthroplasty of the left knee.  Note that he is not a candidate for tranexamic acid, but is a candidate for dexamethasone.  His primary care doctor is Dr. Burton Apley.  His cardiologist is Dr. Elease Hashimoto.  He does plan on going home after surgery.  He is given his home medicines today of Robaxin, iron, Colace, and MiraLAX and he will go back on his Plavix and aspirin postoperatively for his DVT prophylaxis.  The surgery, risks, benefits, and aftercare were discussed in detail with the patient.  Questions invited and answered.  PAST MEDICAL HISTORY:  Drug allergy to CLARITHROMYCIN with drowsiness and lethargy.  CURRENT MEDICATIONS: 1. Metoprolol 25 mg b.i.d. 2. Atorvastatin 20 mg daily. 3. Clopidogrel 75 mg daily. 4. Fish oil 1000 mg daily. 5. Bayer aspirin 100 mg daily. 6. Benadryl 25 mg at bedtime.  SERIOUS MEDICAL ILLNESSES:  Include coronary artery disease with heart stent in 2009, hypertension, history of asthma as a child.  No history as an adult and osteoarthritis.  PREVIOUS SURGERIES:  Include left and right knee arthrotomies for torn meniscus.  FAMILY HISTORY:  Positive for coronary artery disease.  SOCIAL HISTORY:  The patient is married.  He does not smoke.  He drinks 5 beers a week and 2 glasses of wine a week and plans on going  home after surgery.  REVIEW OF SYSTEMS:  CENTRAL NERVOUS SYSTEM:  Negative for headache, blurred vision, or dizziness.  PULMONARY:  Negative for shortness breath, PND, orthopnea.  CARDIOVASCULAR:  Positive for history of coronary artery disease with stents.  Negative for angina.  GI: Negative for ulcers, hepatitis.  GU:  Negative for urinary tract difficulty.  MUSCULOSKELETAL:  Positive as in HPI.  PHYSICAL EXAMINATION:  VITAL SIGNS:  BP today 132/88, pulse 64 and regular, respirations 14. HEENT:  Head normocephalic.  Nose patent.  Ears patent.  Pupils equal, round, and reactive to light.  Throat without injection. NECK:  Supple without adenopathy.  Carotids 2+ without bruit. CHEST:  Clear to auscultation.  No rales or rhonchi.  Respirations 14. HEART:  Regular rate and rhythm at 64 beats per minute without murmur. ABDOMEN:  Soft with active bowel sounds.  No masses or organomegaly. NEUROLOGIC:  The patient is alert and oriented to time, place, and person.  Cranial nerves II-XII grossly intact. EXTREMITIES:  Show both knees with 1+ effusion.  Varus deformity. Sensation and circulation are intact.  Range of motion of the left knee is 3 degrees from full extension with further flexion to 115  degrees. Crepitation throughout the range of motion and again neurovascular status intact.  IMPRESSION:  End-stage osteoarthritis, left knee.  PLAN:  Total knee arthroplasty, left knee.     Jaquelyn Bitter. Rony Ratz, P.A.   ______________________________ Madlyn Frankel Charlann Boxer, M.D.    SJC/MEDQ  D:  07/13/2012  T:  07/14/2012  Job:  [id number]

## 2012-07-18 ENCOUNTER — Telehealth: Payer: Self-pay | Admitting: Cardiovascular Disease

## 2012-07-18 NOTE — Telephone Encounter (Signed)
Let a message to call back

## 2012-07-18 NOTE — Telephone Encounter (Signed)
New problem:  Per Clydie Braun calling checking on status of cardiac clearance  Surgery on 8/20. Knee replacement .

## 2012-07-18 NOTE — Telephone Encounter (Signed)
Follow-up:   Called in needing the patient's medical clearance or office note faxed over to [phone].

## 2012-07-18 NOTE — Telephone Encounter (Signed)
Letter for surgical clearance faxed again to [phone].

## 2012-07-25 NOTE — Pre-Procedure Instructions (Signed)
PT'S NOTE OF CARDIAC CLEARANCE FOR LEFT TKA ON CHART FROM DR. NAHSER -FAXED BY DR. Nilsa Nutting OFFICE.

## 2012-07-26 ENCOUNTER — Encounter (HOSPITAL_COMMUNITY): Payer: Self-pay | Admitting: Anesthesiology

## 2012-07-26 ENCOUNTER — Inpatient Hospital Stay (HOSPITAL_COMMUNITY)
Admission: RE | Admit: 2012-07-26 | Discharge: 2012-07-27 | DRG: 470 | Disposition: A | Discharge status: Home Health | Payer: Medicare Other | Source: Ambulatory Visit | Attending: Orthopedic Surgery | Admitting: Orthopedic Surgery

## 2012-07-26 ENCOUNTER — Encounter (HOSPITAL_COMMUNITY)
Admission: RE | Disposition: A | Discharge status: Home Health | Payer: Self-pay | Source: Ambulatory Visit | Attending: Orthopedic Surgery

## 2012-07-26 ENCOUNTER — Ambulatory Visit (HOSPITAL_COMMUNITY): Payer: Medicare Other | Admitting: Anesthesiology

## 2012-07-26 ENCOUNTER — Encounter (HOSPITAL_COMMUNITY): Payer: Self-pay | Admitting: *Deleted

## 2012-07-26 DIAGNOSIS — Z96659 Presence of unspecified artificial knee joint: Secondary | ICD-10-CM

## 2012-07-26 DIAGNOSIS — M1710 Unilateral primary osteoarthritis, unspecified knee: Principal | ICD-10-CM | POA: Diagnosis present

## 2012-07-26 DIAGNOSIS — I2510 Atherosclerotic heart disease of native coronary artery without angina pectoris: Secondary | ICD-10-CM | POA: Diagnosis present

## 2012-07-26 DIAGNOSIS — Z9861 Coronary angioplasty status: Secondary | ICD-10-CM

## 2012-07-26 DIAGNOSIS — I10 Essential (primary) hypertension: Secondary | ICD-10-CM | POA: Diagnosis present

## 2012-07-26 HISTORY — PX: TOTAL KNEE ARTHROPLASTY: SHX125

## 2012-07-26 LAB — ABO/RH: ABO/RH(D): A POS

## 2012-07-26 LAB — TYPE AND SCREEN: ABO/RH(D): A POS

## 2012-07-26 SURGERY — ARTHROPLASTY, KNEE, TOTAL
Anesthesia: Spinal | Site: Knee | Laterality: Left | Wound class: Clean

## 2012-07-26 MED ORDER — POLYETHYLENE GLYCOL 3350 17 G PO PACK
17.0000 g | PACK | Freq: Two times a day (BID) | ORAL | Status: DC
  Administered 2012-07-26 – 2012-07-27 (×3): 17 g via ORAL
  Filled 2012-07-26 (×4): qty 1

## 2012-07-26 MED ORDER — DEXAMETHASONE SODIUM PHOSPHATE 10 MG/ML IJ SOLN
10.0000 mg | Freq: Once | INTRAMUSCULAR | Status: AC
  Administered 2012-07-27: 10 mg via INTRAVENOUS
  Filled 2012-07-26: qty 1

## 2012-07-26 MED ORDER — KETAMINE HCL 10 MG/ML IJ SOLN
INTRAMUSCULAR | Status: DC | PRN
  Administered 2012-07-26 (×2): 10 mg via INTRAVENOUS
  Administered 2012-07-26: 1 mg via INTRAVENOUS
  Administered 2012-07-26 (×2): 5 mg via INTRAVENOUS
  Administered 2012-07-26: 10 mg via INTRAVENOUS
  Administered 2012-07-26: 1 mg via INTRAVENOUS
  Administered 2012-07-26 (×2): 10 mg via INTRAVENOUS

## 2012-07-26 MED ORDER — DIPHENHYDRAMINE HCL 25 MG PO TABS: 25.0000 mg | ORAL_TABLET | Freq: Every morning | ORAL | Status: DC

## 2012-07-26 MED ORDER — ONDANSETRON HCL 4 MG/2ML IJ SOLN
INTRAMUSCULAR | Status: DC | PRN
  Administered 2012-07-26: 4 mg via INTRAVENOUS

## 2012-07-26 MED ORDER — LACTATED RINGERS IV SOLN
INTRAVENOUS | Status: DC
  Administered 2012-07-26: 1000 mL via INTRAVENOUS
  Administered 2012-07-26 (×2): via INTRAVENOUS

## 2012-07-26 MED ORDER — FLEET ENEMA 7-19 GM/118ML RE ENEM
1.0000 | ENEMA | Freq: Once | RECTAL | Status: AC | PRN
  Filled 2012-07-26: qty 1

## 2012-07-26 MED ORDER — DIPHENHYDRAMINE HCL 25 MG PO CAPS
25.0000 mg | ORAL_CAPSULE | Freq: Every day | ORAL | Status: DC
  Administered 2012-07-27: 25 mg via ORAL
  Filled 2012-07-26 (×3): qty 1

## 2012-07-26 MED ORDER — DEXAMETHASONE SODIUM PHOSPHATE 10 MG/ML IJ SOLN
INTRAMUSCULAR | Status: DC | PRN
  Administered 2012-07-26: 10 mg via INTRAVENOUS

## 2012-07-26 MED ORDER — ONDANSETRON HCL 4 MG/2ML IJ SOLN: 4.0000 mg | Freq: Four times a day (QID) | INTRAMUSCULAR | Status: DC | PRN

## 2012-07-26 MED ORDER — DOCUSATE SODIUM 100 MG PO CAPS
100.0000 mg | ORAL_CAPSULE | Freq: Two times a day (BID) | ORAL | Status: DC
  Administered 2012-07-26 – 2012-07-27 (×3): 100 mg via ORAL
  Filled 2012-07-26 (×4): qty 1

## 2012-07-26 MED ORDER — ONDANSETRON HCL 4 MG PO TABS
4.0000 mg | ORAL_TABLET | Freq: Four times a day (QID) | ORAL | Status: DC | PRN
  Administered 2012-07-27: 4 mg via ORAL
  Filled 2012-07-26 (×2): qty 1

## 2012-07-26 MED ORDER — HYDROCODONE-ACETAMINOPHEN 7.5-325 MG PO TABS
1.0000 | ORAL_TABLET | ORAL | Status: DC
  Administered 2012-07-26: 2 via ORAL
  Administered 2012-07-26: 1 via ORAL
  Administered 2012-07-27 (×5): 2 via ORAL
  Filled 2012-07-26: qty 2
  Filled 2012-07-26: qty 1
  Filled 2012-07-26 (×5): qty 2

## 2012-07-26 MED ORDER — 0.9 % SODIUM CHLORIDE (POUR BTL) OPTIME
TOPICAL | Status: DC | PRN
  Administered 2012-07-26: 1000 mL

## 2012-07-26 MED ORDER — ACETAMINOPHEN 10 MG/ML IV SOLN
INTRAVENOUS | Status: DC | PRN
  Administered 2012-07-26: 1000 mg via INTRAVENOUS

## 2012-07-26 MED ORDER — SODIUM CHLORIDE 0.9 % IV SOLN
INTRAVENOUS | Status: DC
  Administered 2012-07-26: 18:00:00 via INTRAVENOUS
  Filled 2012-07-26 (×8): qty 1000

## 2012-07-26 MED ORDER — NITROGLYCERIN 0.4 MG SL SUBL: 0.4000 mg | SUBLINGUAL_TABLET | SUBLINGUAL | Status: DC | PRN

## 2012-07-26 MED ORDER — HYDROMORPHONE HCL PF 1 MG/ML IJ SOLN: 0.2500 mg | INTRAMUSCULAR | Status: DC | PRN

## 2012-07-26 MED ORDER — METOCLOPRAMIDE HCL 5 MG/ML IJ SOLN
INTRAMUSCULAR | Status: DC | PRN
  Administered 2012-07-26: 10 mg via INTRAVENOUS

## 2012-07-26 MED ORDER — BUPIVACAINE IN DEXTROSE 0.75-8.25 % IT SOLN
INTRATHECAL | Status: DC | PRN
  Administered 2012-07-26: 1.8 mL via INTRATHECAL

## 2012-07-26 MED ORDER — BISACODYL 10 MG RE SUPP
10.0000 mg | Freq: Every day | RECTAL | Status: DC | PRN
  Filled 2012-07-26: qty 1

## 2012-07-26 MED ORDER — DEXAMETHASONE SODIUM PHOSPHATE 10 MG/ML IJ SOLN: 10.0000 mg | Freq: Once | INTRAMUSCULAR | Status: DC

## 2012-07-26 MED ORDER — ACETAMINOPHEN 10 MG/ML IV SOLN
INTRAVENOUS | Status: AC
  Filled 2012-07-26: qty 100

## 2012-07-26 MED ORDER — METHOCARBAMOL 500 MG PO TABS
500.0000 mg | ORAL_TABLET | Freq: Four times a day (QID) | ORAL | Status: DC | PRN
  Administered 2012-07-27: 500 mg via ORAL
  Filled 2012-07-26: qty 1

## 2012-07-26 MED ORDER — MENTHOL 3 MG MT LOZG: 1.0000 | LOZENGE | OROMUCOSAL | Status: DC | PRN

## 2012-07-26 MED ORDER — ASPIRIN 81 MG PO CHEW
162.0000 mg | CHEWABLE_TABLET | Freq: Every day | ORAL | Status: DC
  Administered 2012-07-27: 162 mg via ORAL
  Filled 2012-07-26: qty 2

## 2012-07-26 MED ORDER — PHENYLEPHRINE HCL 10 MG/ML IJ SOLN
INTRAMUSCULAR | Status: DC | PRN
  Administered 2012-07-26 (×5): 40 ug via INTRAVENOUS

## 2012-07-26 MED ORDER — CELECOXIB 200 MG PO CAPS
200.0000 mg | ORAL_CAPSULE | Freq: Two times a day (BID) | ORAL | Status: DC
  Administered 2012-07-26 – 2012-07-27 (×3): 200 mg via ORAL
  Filled 2012-07-26 (×4): qty 1

## 2012-07-26 MED ORDER — MEPERIDINE HCL 50 MG/ML IJ SOLN: 6.2500 mg | INTRAMUSCULAR | Status: DC | PRN

## 2012-07-26 MED ORDER — DIPHENHYDRAMINE HCL 25 MG PO CAPS
25.0000 mg | ORAL_CAPSULE | Freq: Four times a day (QID) | ORAL | Status: DC | PRN
  Filled 2012-07-26 (×2): qty 1

## 2012-07-26 MED ORDER — CHLORHEXIDINE GLUCONATE 4 % EX LIQD: 60.0000 mL | Freq: Once | CUTANEOUS | Status: DC

## 2012-07-26 MED ORDER — LACTATED RINGERS IV SOLN: INTRAVENOUS | Status: DC

## 2012-07-26 MED ORDER — METHOCARBAMOL 100 MG/ML IJ SOLN
500.0000 mg | Freq: Four times a day (QID) | INTRAVENOUS | Status: DC | PRN
  Administered 2012-07-26: 500 mg via INTRAVENOUS
  Filled 2012-07-26 (×2): qty 5

## 2012-07-26 MED ORDER — PROPOFOL 10 MG/ML IV EMUL
INTRAVENOUS | Status: DC | PRN
  Administered 2012-07-26: 120 ug/kg/min via INTRAVENOUS

## 2012-07-26 MED ORDER — FERROUS SULFATE 325 (65 FE) MG PO TABS
325.0000 mg | ORAL_TABLET | Freq: Three times a day (TID) | ORAL | Status: DC
  Administered 2012-07-26 – 2012-07-27 (×3): 325 mg via ORAL
  Filled 2012-07-26 (×6): qty 1

## 2012-07-26 MED ORDER — PHENYLEPHRINE HCL 10 MG/ML IJ SOLN
10.0000 mg | INTRAVENOUS | Status: DC | PRN
  Administered 2012-07-26: 50 ug/min via INTRAVENOUS

## 2012-07-26 MED ORDER — BUPIVACAINE-EPINEPHRINE 0.25% -1:200000 IJ SOLN
INTRAMUSCULAR | Status: AC
  Filled 2012-07-26: qty 1

## 2012-07-26 MED ORDER — BUPIVACAINE-EPINEPHRINE PF 0.25-1:200000 % IJ SOLN
INTRAMUSCULAR | Status: DC | PRN
  Administered 2012-07-26: 50 mL

## 2012-07-26 MED ORDER — SODIUM CHLORIDE 0.9 % IR SOLN
Status: DC | PRN
  Administered 2012-07-26: 1000 mL

## 2012-07-26 MED ORDER — PHENOL 1.4 % MT LIQD: 1.0000 | OROMUCOSAL | Status: DC | PRN

## 2012-07-26 MED ORDER — ZOLPIDEM TARTRATE 5 MG PO TABS: 5.0000 mg | ORAL_TABLET | Freq: Every evening | ORAL | Status: DC | PRN

## 2012-07-26 MED ORDER — ATORVASTATIN CALCIUM 20 MG PO TABS
20.0000 mg | ORAL_TABLET | Freq: Every day | ORAL | Status: DC
  Administered 2012-07-26: 20 mg via ORAL
  Filled 2012-07-26 (×2): qty 1

## 2012-07-26 MED ORDER — CLOPIDOGREL BISULFATE 75 MG PO TABS
75.0000 mg | ORAL_TABLET | Freq: Every day | ORAL | Status: DC
  Administered 2012-07-27: 75 mg via ORAL
  Filled 2012-07-26 (×2): qty 1

## 2012-07-26 MED ORDER — KETOROLAC TROMETHAMINE 30 MG/ML IJ SOLN
INTRAMUSCULAR | Status: DC | PRN
  Administered 2012-07-26: 30 mg via INTRAVENOUS

## 2012-07-26 MED ORDER — KETOROLAC TROMETHAMINE 30 MG/ML IJ SOLN
INTRAMUSCULAR | Status: AC
  Filled 2012-07-26: qty 1

## 2012-07-26 MED ORDER — CEFAZOLIN SODIUM-DEXTROSE 2-3 GM-% IV SOLR
2.0000 g | INTRAVENOUS | Status: AC
  Administered 2012-07-26: 2 g via INTRAVENOUS

## 2012-07-26 MED ORDER — ALUM & MAG HYDROXIDE-SIMETH 200-200-20 MG/5ML PO SUSP
30.0000 mL | ORAL | Status: DC | PRN
  Filled 2012-07-26: qty 30

## 2012-07-26 MED ORDER — CEFAZOLIN SODIUM-DEXTROSE 2-3 GM-% IV SOLR
2.0000 g | Freq: Four times a day (QID) | INTRAVENOUS | Status: AC
  Administered 2012-07-26 (×2): 2 g via INTRAVENOUS
  Filled 2012-07-26 (×2): qty 50

## 2012-07-26 MED ORDER — PROMETHAZINE HCL 25 MG/ML IJ SOLN: 6.2500 mg | INTRAMUSCULAR | Status: DC | PRN

## 2012-07-26 MED ORDER — LIDOCAINE HCL (CARDIAC) 20 MG/ML IV SOLN
INTRAVENOUS | Status: DC | PRN
  Administered 2012-07-26: 30 mg via INTRAVENOUS

## 2012-07-26 MED ORDER — FENTANYL CITRATE 0.05 MG/ML IJ SOLN
INTRAMUSCULAR | Status: DC | PRN
  Administered 2012-07-26 (×2): 50 ug via INTRAVENOUS

## 2012-07-26 MED ORDER — CEFAZOLIN SODIUM-DEXTROSE 2-3 GM-% IV SOLR
INTRAVENOUS | Status: AC
  Filled 2012-07-26: qty 50

## 2012-07-26 MED ORDER — METOCLOPRAMIDE HCL 5 MG PO TABS
5.0000 mg | ORAL_TABLET | Freq: Three times a day (TID) | ORAL | Status: DC | PRN
  Filled 2012-07-26: qty 2

## 2012-07-26 MED ORDER — METOCLOPRAMIDE HCL 5 MG/ML IJ SOLN
5.0000 mg | Freq: Three times a day (TID) | INTRAMUSCULAR | Status: DC | PRN
  Administered 2012-07-27: 10 mg via INTRAVENOUS
  Filled 2012-07-26: qty 2

## 2012-07-26 MED ORDER — MIDAZOLAM HCL 5 MG/5ML IJ SOLN
INTRAMUSCULAR | Status: DC | PRN
  Administered 2012-07-26: 1 mg via INTRAVENOUS

## 2012-07-26 MED ORDER — METOPROLOL TARTRATE 25 MG PO TABS
25.0000 mg | ORAL_TABLET | Freq: Two times a day (BID) | ORAL | Status: DC
  Administered 2012-07-26 – 2012-07-27 (×3): 25 mg via ORAL
  Filled 2012-07-26 (×4): qty 1

## 2012-07-26 MED ORDER — HYDROMORPHONE HCL PF 1 MG/ML IJ SOLN
0.5000 mg | INTRAMUSCULAR | Status: DC | PRN
  Administered 2012-07-26 (×2): 1 mg via INTRAVENOUS
  Filled 2012-07-26 (×2): qty 1

## 2012-07-26 MED ORDER — FLUTICASONE PROPIONATE 50 MCG/ACT NA SUSP
1.0000 | Freq: Every day | NASAL | Status: DC
  Filled 2012-07-26: qty 16

## 2012-07-26 MED ORDER — ASPIRIN 325 MG PO TABS
175.0000 mg | ORAL_TABLET | Freq: Every day | ORAL | Status: DC
  Filled 2012-07-26: qty 0.5

## 2012-07-26 MED ORDER — EPHEDRINE SULFATE 50 MG/ML IJ SOLN
INTRAMUSCULAR | Status: DC | PRN
  Administered 2012-07-26 (×2): 5 mg via INTRAVENOUS

## 2012-07-26 SURGICAL SUPPLY — 55 items
BAG ZIPLOCK 12X15 (MISCELLANEOUS) ×2 IMPLANT
BANDAGE ELASTIC 6 VELCRO ST LF (GAUZE/BANDAGES/DRESSINGS) ×2 IMPLANT
BANDAGE ESMARK 6X9 LF (GAUZE/BANDAGES/DRESSINGS) ×1 IMPLANT
BLADE SAW SGTL 13.0X1.19X90.0M (BLADE) ×2 IMPLANT
BNDG ESMARK 6X9 LF (GAUZE/BANDAGES/DRESSINGS) ×2
BOWL SMART MIX CTS (DISPOSABLE) ×2 IMPLANT
CEMENT HV SMART SET (Cement) ×4 IMPLANT
CLOTH BEACON ORANGE TIMEOUT ST (SAFETY) ×2 IMPLANT
CUFF TOURN SGL QUICK 34 (TOURNIQUET CUFF) ×1
CUFF TRNQT CYL 34X4X40X1 (TOURNIQUET CUFF) ×1 IMPLANT
DECANTER SPIKE VIAL GLASS SM (MISCELLANEOUS) ×2 IMPLANT
DERMABOND ADVANCED (GAUZE/BANDAGES/DRESSINGS) ×1
DERMABOND ADVANCED .7 DNX12 (GAUZE/BANDAGES/DRESSINGS) ×1 IMPLANT
DRAPE EXTREMITY T 121X128X90 (DRAPE) ×2 IMPLANT
DRAPE POUCH INSTRU U-SHP 10X18 (DRAPES) ×2 IMPLANT
DRAPE U-SHAPE 47X51 STRL (DRAPES) ×2 IMPLANT
DRSG AQUACEL AG ADV 3.5X10 (GAUZE/BANDAGES/DRESSINGS) ×2 IMPLANT
DRSG TEGADERM 4X4.75 (GAUZE/BANDAGES/DRESSINGS) ×2 IMPLANT
DURAPREP 26ML APPLICATOR (WOUND CARE) ×2 IMPLANT
ELECT REM PT RETURN 9FT ADLT (ELECTROSURGICAL) ×2
ELECTRODE REM PT RTRN 9FT ADLT (ELECTROSURGICAL) ×1 IMPLANT
EVACUATOR 1/8 PVC DRAIN (DRAIN) ×2 IMPLANT
FACESHIELD LNG OPTICON STERILE (SAFETY) ×10 IMPLANT
GAUZE SPONGE 2X2 8PLY STRL LF (GAUZE/BANDAGES/DRESSINGS) ×1 IMPLANT
GLOVE BIOGEL PI IND STRL 7.5 (GLOVE) ×1 IMPLANT
GLOVE BIOGEL PI IND STRL 8 (GLOVE) ×1 IMPLANT
GLOVE BIOGEL PI INDICATOR 7.5 (GLOVE) ×1
GLOVE BIOGEL PI INDICATOR 8 (GLOVE) ×1
GLOVE ECLIPSE 8.0 STRL XLNG CF (GLOVE) ×2 IMPLANT
GLOVE ORTHO TXT STRL SZ7.5 (GLOVE) ×4 IMPLANT
GOWN BRE IMP PREV XXLGXLNG (GOWN DISPOSABLE) ×4 IMPLANT
GOWN STRL NON-REIN LRG LVL3 (GOWN DISPOSABLE) ×2 IMPLANT
HANDPIECE INTERPULSE COAX TIP (DISPOSABLE) ×1
IMMOBILIZER KNEE 20 (SOFTGOODS)
IMMOBILIZER KNEE 20 THIGH 36 (SOFTGOODS) IMPLANT
KIT BASIN OR (CUSTOM PROCEDURE TRAY) ×2 IMPLANT
MANIFOLD NEPTUNE II (INSTRUMENTS) ×2 IMPLANT
NDL SAFETY ECLIPSE 18X1.5 (NEEDLE) ×1 IMPLANT
NEEDLE HYPO 18GX1.5 SHARP (NEEDLE) ×1
NS IRRIG 1000ML POUR BTL (IV SOLUTION) ×4 IMPLANT
PACK TOTAL JOINT (CUSTOM PROCEDURE TRAY) ×2 IMPLANT
POSITIONER SURGICAL ARM (MISCELLANEOUS) ×2 IMPLANT
SET HNDPC FAN SPRY TIP SCT (DISPOSABLE) ×1 IMPLANT
SET PAD KNEE POSITIONER (MISCELLANEOUS) ×2 IMPLANT
SPONGE GAUZE 2X2 STER 10/PKG (GAUZE/BANDAGES/DRESSINGS) ×1
SUCTION FRAZIER 12FR DISP (SUCTIONS) ×2 IMPLANT
SUT MNCRL AB 4-0 PS2 18 (SUTURE) ×2 IMPLANT
SUT VIC AB 1 CT1 36 (SUTURE) ×6 IMPLANT
SUT VIC AB 2-0 CT1 27 (SUTURE) ×3
SUT VIC AB 2-0 CT1 TAPERPNT 27 (SUTURE) ×3 IMPLANT
SYR 50ML LL SCALE MARK (SYRINGE) ×2 IMPLANT
TOWEL OR 17X26 10 PK STRL BLUE (TOWEL DISPOSABLE) ×4 IMPLANT
TRAY FOLEY CATH 14FRSI W/METER (CATHETERS) ×2 IMPLANT
WATER STERILE IRR 1500ML POUR (IV SOLUTION) ×2 IMPLANT
WRAP KNEE MAXI GEL POST OP (GAUZE/BANDAGES/DRESSINGS) ×2 IMPLANT

## 2012-07-26 NOTE — Anesthesia Postprocedure Evaluation (Signed)
  Anesthesia Post-op Note  Patient: [name]  Procedure(s) Performed: Procedure(s) (LRB): TOTAL KNEE ARTHROPLASTY (Left)  Patient Location: PACU  Anesthesia Type: Spinal  Level of Consciousness: awake and alert   Airway and Oxygen Therapy: Patient Spontanous Breathing  Post-op Pain: mild  Post-op Assessment: Post-op Vital signs reviewed, Patient's Cardiovascular Status Stable, Respiratory Function Stable, Patent Airway and No signs of Nausea or vomiting  Post-op Vital Signs: stable  Complications: No apparent anesthesia complications

## 2012-07-26 NOTE — Transfer of Care (Signed)
Immediate Anesthesia Transfer of Care Note  Patient: [name]  Procedure(s) Performed: Procedure(s) (LRB): TOTAL KNEE ARTHROPLASTY (Left)  Patient Location: PACU  Anesthesia Type: MAC and Spinal  Level of Consciousness: awake, alert , oriented, patient cooperative and responds to stimulation  Airway & Oxygen Therapy: Patient Spontanous Breathing and Patient connected to face mask oxygen  Post-op Assessment: Report given to PACU RN and Post -op Vital signs reviewed and stable  Post vital signs: Reviewed and stable  Complications: No apparent anesthesia complications

## 2012-07-26 NOTE — Interval H&P Note (Signed)
History and Physical Interval Note:  07/26/2012 7:16 AM  [name]  has presented today for surgery, with the diagnosis of Osteoarthritis of the Left Knee  The various methods of treatment have been discussed with the patient and family. After consideration of risks, benefits and other options for treatment, the patient has consented to  Procedure(s) (LRB): LEFT TOTAL KNEE ARTHROPLASTY (Left) as a surgical intervention .  The patient's history has been reviewed, patient examined, no change in status, stable for surgery.  I have reviewed the patient's chart and labs.  Questions were answered to the patient's satisfaction.     [name]

## 2012-07-26 NOTE — Anesthesia Procedure Notes (Signed)
Spinal  Patient location during procedure: OR Staffing Anesthesiologist: Jayleena Stille Performed by: anesthesiologist  Preanesthetic Checklist Completed: patient identified, site marked, surgical consent, pre-op evaluation, timeout performed, IV checked, risks and benefits discussed and monitors and equipment checked Spinal Block Patient position: sitting Prep: Betadine Patient monitoring: heart rate, continuous pulse ox and blood pressure Approach: right paramedian Location: L3-4 Injection technique: single-shot Needle Needle type: Spinocan  Needle gauge: 22 G Needle length: 9 cm Additional Notes Expiration date of kit checked and confirmed. Patient tolerated procedure well, without complications.     

## 2012-07-26 NOTE — Progress Notes (Signed)
Utilization review completed.  

## 2012-07-26 NOTE — Anesthesia Preprocedure Evaluation (Signed)
Anesthesia Evaluation  Patient identified by MRN, date of birth, ID band Patient awake    Reviewed: Allergy & Precautions, H&P , NPO status , Patient's Chart, lab work & pertinent test results  Airway Mallampati: II TM Distance: >3 FB Neck ROM: Full    Dental No notable dental hx.    Pulmonary neg pulmonary ROS,  breath sounds clear to auscultation  Pulmonary exam normal       Cardiovascular hypertension, Pt. on medications + CAD and + Cardiac Stents (2009) negative cardio ROS  Rhythm:Regular Rate:Normal     Neuro/Psych negative neurological ROS  negative psych ROS   GI/Hepatic negative GI ROS, Neg liver ROS, GERD-  ,  Endo/Other  negative endocrine ROS  Renal/GU negative Renal ROS  negative genitourinary   Musculoskeletal negative musculoskeletal ROS (+)   Abdominal   Peds negative pediatric ROS (+)  Hematology negative hematology ROS (+)   Anesthesia Other Findings   Reproductive/Obstetrics negative OB ROS                           Anesthesia Physical Anesthesia Plan  ASA: III  Anesthesia Plan: Spinal   Post-op Pain Management:    Induction: Intravenous  Airway Management Planned: Simple Face Mask  Additional Equipment:   Intra-op Plan:   Post-operative Plan:   Informed Consent: I have reviewed the patients History and Physical, chart, labs and discussed the procedure including the risks, benefits and alternatives for the proposed anesthesia with the patient or authorized representative who has indicated his/her understanding and acceptance.   Dental advisory given  Plan Discussed with: CRNA  Anesthesia Plan Comments:         Anesthesia Quick Evaluation

## 2012-07-26 NOTE — Op Note (Signed)
NAME:  [name]                      MEDICAL RECORD NO.:  [id number]                             FACILITY:  [facility]      PHYSICIAN:  Madlyn Frankel. Charlann Boxer, M.D.  DATE OF BIRTH:  09/15/1946      DATE OF PROCEDURE:  07/26/2012                                     OPERATIVE REPORT         PREOPERATIVE DIAGNOSIS:  Left knee osteoarthritis.      POSTOPERATIVE DIAGNOSIS:  Left knee osteoarthritis.      FINDINGS:  The patient was noted to have complete loss of cartilage and   bone-on-bone arthritis with associated osteophytes in all three compartments of   the knee with a significant synovitis and associated effusion.      PROCEDURE:  Left total knee replacement.      COMPONENTS USED:  DePuy rotating platform posterior stabilized knee   system, a size 5 femur, 5 tibia, 12.5 mm insert, and 41 patellar   button.      SURGEON:  Madlyn Frankel. Charlann Boxer, M.D.      ASSISTANT:  Lanney Gins, PA-C.      ANESTHESIA:  Spinal.      SPECIMENS:  None.      COMPLICATION:  None.      DRAINS:  One Hemovac.  EBL: <200cc      TOURNIQUET TIME:   Total Tourniquet Time Documented: Thigh (Left) - 44 minutes .      The patient was stable to the recovery room.      INDICATION FOR PROCEDURE:  Samuel VESPA is a 66 y.o. male patient of   mine.  The patient had been seen, evaluated, and treated conservatively in the   office with medication, activity modification, and injections.  The patient had   radiographic changes of bone-on-bone arthritis with endplate sclerosis and osteophytes noted.      The patient failed conservative measures including medication, injections, and activity modification, and at this point was ready for more definitive measures.   Based on the radiographic changes and failed conservative measures, the patient   decided to proceed with total knee replacement.  Risks of infection,   DVT, component failure, need for revision surgery, postop course, and   expectations were all   discussed and reviewed.  Consent was obtained for benefit of pain   relief.      PROCEDURE IN DETAIL:  The patient was brought to the operative theater.   Once adequate anesthesia, preoperative antibiotics, 2 gm of Ancef administered, the patient was positioned supine with the left thigh tourniquet placed.  The  left lower extremity was prepped and draped in sterile fashion.  A time-   out was performed identifying the patient, planned procedure, and   extremity.      The left lower extremity was placed in the [facility] leg holder.  The leg was   exsanguinated, tourniquet elevated to 250 mmHg.  A midline incision was   made followed by median parapatellar arthrotomy.  Following initial   exposure, attention was first directed to the patella.  Precut   measurement was  noted to be 28 mm.  I resected down to 16 mm and used a   41 patellar button to restore patellar height as well as cover the cut   surface.      The lug holes were drilled and a metal shim was placed to protect the   patella from retractors and saw blades.      At this point, attention was now directed to the femur.  The femoral   canal was opened with a drill, irrigated to try to prevent fat emboli.  An   intramedullary rod was passed at 5 degrees valgus, 11 mm of bone was   resected off the distal femur.  Following this resection, the tibia was   subluxated anteriorly.  Using the extramedullary guide, 8 mm of bone was resected off   the proximal lateral tibia.  We confirmed the gap would be   stable medially and laterally with a 10 mm insert as well as confirmed   the cut was perpendicular in the coronal plane, checking with an alignment rod.      Once this was done, I sized the femur to be a size 5 in the anterior-   posterior dimension, chose a standard component based on medial and   lateral dimension.  The size 5 rotation block was then pinned in   position anterior referenced using the C-clamp to set rotation.  The    anterior, posterior, and  chamfer cuts were made without difficulty nor   notching making certain that I was along the anterior cortex to help   with flexion gap stability.      The final box cut was made off the lateral aspect of distal femur.      At this point, the tibia was sized to be a size 5, the size 5 tray was   then pinned in position through the medial third of the tubercle,   drilled, and keel punched.  Trial reduction was now carried with a 5 femur,  5 tibia, a 12.5 mm insert, and the 41 patella botton.  The knee was brought to   extension, full extension with good flexion stability with the patella   tracking through the trochlea without application of pressure.  Given   all these findings, the trial components removed.  Final components were   opened and cement was mixed.  The knee was irrigated with normal saline   solution and pulse lavage.  The synovial lining was   then injected with 0.25% Marcaine with epinephrine and 1 cc of Toradol,   total of 61 cc.      The knee was irrigated.  Final implants were then cemented onto clean and   dried cut surfaces of bone with the knee brought to extension with a 12.5   mm trial insert.      Once the cement had fully cured, the excess cement was removed   throughout the knee.  I confirmed I was satisfied with the range of   motion and stability, and the final 12.5 mm PS insert was chosen.  It was   placed into the knee.      The tourniquet had been let down at 42 minutes.  No significant   hemostasis required.  The medium Hemovac drain was placed deep.  The   extensor mechanism was then reapproximated using #1 Vicryl with the knee   in flexion.  The   remaining wound was closed with 2-0 Vicryl and  running 4-0 Monocryl.   The knee was cleaned, dried, dressed sterilely using Dermabond and   Aquacel dressing.  Drain site dressed separately.  The patient was then   brought to recovery room in stable condition, tolerating the  procedure   well.   Please note that Physician Assistant, Lanney Gins, was present for the entirety of the case, and was utilized for pre-operative positioning, peri-operative retractor management, general facilitation of the procedure.  He was also utilized for primary wound closure at the end of the case.              Madlyn Frankel Charlann Boxer, M.D.

## 2012-07-26 NOTE — Preoperative (Signed)
Beta Blockers   Reason not to administer Beta Blockers:Not Applicable, pt took home BB this am 

## 2012-07-27 ENCOUNTER — Encounter (HOSPITAL_COMMUNITY): Payer: Self-pay | Admitting: Orthopedic Surgery

## 2012-07-27 LAB — BASIC METABOLIC PANEL
Calcium: 8.4 mg/dL (ref 8.4–10.5)
GFR calc non Af Amer: >90 mL/min (ref >90)
Glucose, Bld: 144 mg/dL — ABNORMAL HIGH (ref 70–99)
Sodium: 135 mEq/L (ref 135–145)

## 2012-07-27 LAB — CBC
Hemoglobin: 11.2 g/dL — ABNORMAL LOW (ref 13.0–17.0)
MCH: 32.3 pg (ref 26.0–34.0)
MCHC: 35.1 g/dL (ref 30.0–36.0)
Platelets: 131 10*3/uL — ABNORMAL LOW (ref 150–400)

## 2012-07-27 MED ORDER — DSS 100 MG PO CAPS: 100.0000 mg | ORAL_CAPSULE | Freq: Two times a day (BID) | ORAL | Status: AC

## 2012-07-27 MED ORDER — METHOCARBAMOL 500 MG PO TABS: 500.0000 mg | ORAL_TABLET | Freq: Four times a day (QID) | ORAL | Status: AC | PRN

## 2012-07-27 MED ORDER — POLYETHYLENE GLYCOL 3350 17 G PO PACK: 17.0000 g | PACK | Freq: Two times a day (BID) | ORAL | Status: AC

## 2012-07-27 MED ORDER — FERROUS SULFATE 325 (65 FE) MG PO TABS: 325.0000 mg | ORAL_TABLET | Freq: Three times a day (TID) | ORAL | Status: DC

## 2012-07-27 MED ORDER — HYDROCODONE-ACETAMINOPHEN 7.5-325 MG PO TABS: 1.0000 | ORAL_TABLET | ORAL | Status: AC | PRN

## 2012-07-27 MED ORDER — PROMETHAZINE HCL 25 MG PO TABS
25.0000 mg | ORAL_TABLET | Freq: Once | ORAL | Status: AC
  Administered 2012-07-27: 25 mg via ORAL
  Filled 2012-07-27: qty 1

## 2012-07-27 NOTE — Progress Notes (Signed)
   Subjective: 1 Day Post-Op Procedure(s) (LRB): TOTAL KNEE ARTHROPLASTY (Left)   Patient reports pain as mild, pain well controlled. No events throughout the night. Ready to be discharged home.  Objective:   VITALS:   Filed Vitals:   07/27/12 0646  BP: 161/75  Pulse: 63  Temp: 97.8 F (36.6 C)  Resp: 16    Neurovascular intact Dorsiflexion/Plantar flexion intact Incision: dressing C/D/I No cellulitis present Compartment soft  LABS  Basename 07/27/12 0407  HGB 11.2*  HCT 31.9*  WBC 14.2*  PLT 131*     Basename 07/27/12 0407  NA 135  K 4.3  BUN 14  CREATININE 0.77  GLUCOSE 144*     Assessment/Plan: 1 Day Post-Op Procedure(s) (LRB): TOTAL KNEE ARTHROPLASTY (Left)   HV drain d/c'ed Foley cath d/c'ed Advance diet Up with therapy D/C IV fluids Discharge home with home health Follow up in 2 weeks at [facility].  Follow-up Information    Follow up with OLIN,London Nonaka D in 2 weeks.   Contact information:   [facility] [address] [place] [address] [phone]             Anastasio Auerbach. Eurydice Calixto   PAC  07/27/2012, 8:37 AM

## 2012-07-27 NOTE — Discharge Summary (Signed)
Physician Discharge Summary  Patient ID: [name] MRN: [id number] DOB/AGE: [birth year] 66 y.o.  Admit date: 07/26/2012 Discharge date:  07/27/2012  Procedures:  Procedure(s) (LRB): TOTAL KNEE ARTHROPLASTY (Left)  Attending Physician:  Dr. Durene Romans   Admission Diagnoses:    End-stage osteoarthritis, left knee  Discharge Diagnoses:  Principal Problem:  *S/P left TKA Coronary artery disease with heart stent in 2009 Hypertension History of asthma as a child, no history as an adult  Osteoarthritis   HPI: This is a 66 year old gentleman with a history of end-stage osteoarthritis of his left knee that has failed conservative treatment. After discussion of treatment, benefits, risks, and options, the patient is now scheduled for total knee arthroplasty of the left knee. Note that he is not a candidate for tranexamic acid, but is a candidate for dexamethasone. His primary care doctor is Dr. Burton Apley. His cardiologist is Dr. Elease Hashimoto. He does plan on going home after surgery. He is given his home medicines today of Robaxin, iron, Colace, and MiraLAX and he will go back on his Plavix and aspirin postoperatively for his DVT prophylaxis. The surgery, risks, benefits, and aftercare were discussed in detail with the patient. Questions invited and answered.  PCP: Lorenda Peck, MD   Discharged Condition: good  Hospital Course:  Patient underwent the above stated procedure on 07/26/2012. Patient tolerated the procedure well and brought to the recovery room in good condition and subsequently to the floor.  POD #1 BP: 161/75 ; Pulse: 63 ; Temp: 97.8 F (36.6 C) ; Resp: 16  Pt's foley was removed, as well as the hemovac drain removed. IV was changed to a saline lock. Patient reports pain as mild, pain well controlled. No events throughout the night. Ready to be discharged home. Neurovascular intact, dorsiflexion/plantar flexion intact, incision: dressing C/D/I, no cellulitis  present and compartment soft.   LABS  Basename  07/27/12 0407   HGB  11.2  HCT  31.9    Discharge Exam: General appearance: alert, cooperative and no distress Extremities: Homans sign is negative, no sign of DVT, no edema, redness or tenderness in the calves or thighs and no ulcers, gangrene or trophic changes  Disposition:  Home  with follow up in 2 weeks   Follow-up Information    Follow up with [name], MD in 2 weeks.   Contact information:   [facility] [address], [address] [place] [address] [phone]          Discharge Orders    Future Orders Please Complete By Expires   Diet - low sodium heart healthy      Call MD / Call 911      Comments:   If you experience chest pain or shortness of breath, CALL 911 and be transported to the hospital emergency room.  If you develope a fever above 101 F, pus (white drainage) or increased drainage or redness at the wound, or calf pain, call your surgeon's office.   Discharge instructions      Comments:   Maintain surgical dressing for 8 days, then replace with gauze and tape. Keep the area dry and clean until follow up. Follow up in 2 weeks at [facility]. Call with any questions or concerns.   Constipation Prevention      Comments:   Drink plenty of fluids.  Prune juice may be helpful.  You may use a stool softener, such as Colace (over the counter) 100 mg twice a day.  Use MiraLax (over the counter) for constipation as needed.   Increase activity slowly as tolerated      Driving restrictions      Comments:   No driving for 4 weeks   TED hose      Comments:   Use stockings (TED hose) for 2 weeks on both leg(s).  You may remove them at night for sleeping.   Change dressing      Comments:   Maintain surgical dressing for 8 days, then change the dressing daily with sterile 4 x 4 inch gauze dressing and tape. Keep the area dry and clean.      Current Discharge  Medication List    START taking these medications   Details  docusate sodium 100 MG CAPS Take 100 mg by mouth 2 (two) times daily. Qty: 10 capsule    ferrous sulfate 325 (65 FE) MG tablet Take 1 tablet (325 mg total) by mouth 3 (three) times daily after meals.    HYDROcodone-acetaminophen (NORCO) 7.5-325 MG per tablet Take 1-2 tablets by mouth every 4 (four) hours as needed for pain. Qty: 120 tablet, Refills: 0    methocarbamol (ROBAXIN) 500 MG tablet Take 1 tablet (500 mg total) by mouth every 6 (six) hours as needed (muscle spasms).    polyethylene glycol (MIRALAX / GLYCOLAX) packet Take 17 g by mouth 2 (two) times daily. Qty: 14 each      CONTINUE these medications which have NOT CHANGED   Details  aspirin 325 MG tablet Take 175 mg by mouth daily.    atorvastatin (LIPITOR) 20 MG tablet TAKE 1 TABLET (20 MG TOTAL) BY MOUTH DAILY. Qty: 30 tablet, Refills: 5    fish oil-omega-3 fatty acids 1000 MG capsule Take 1 g by mouth daily.     Fluticasone Propionate (FLONASE NA) Place 2 sprays into the nose as needed. For sinus infection    metoprolol tartrate (LOPRESSOR) 25 MG tablet Take 1 tablet (25 mg total) by mouth 2 (two) times daily. Qty: 60 tablet, Refills: 5    Multiple Vitamin (MULTIVITAMIN WITH MINERALS) TABS Take 1 tablet by mouth daily.    Ascorbic Acid (VITAMIN C) 500 MG tablet Take 500 mg by mouth as needed. For sinus infection    clopidogrel (PLAVIX) 75 MG tablet Take 75 mg by mouth every morning.    diphenhydrAMINE (BENADRYL) 25 MG tablet Take 25 mg by mouth every morning.    nitroGLYCERIN (NITROSTAT) 0.4 MG SL tablet Place 0.4 mg under the tongue every 5 (five) minutes as needed. For chest pain         Signed: Anastasio Auerbach. Miamarie Moll   PAC  07/27/2012, 8:46 AM

## 2012-07-27 NOTE — Progress Notes (Signed)
Physical Therapy Treatment Patient Details Name: [name] MRN: [id number] DOB: March 09, 1946 Today's Date: 07/27/2012 Time: 1335-1430 PT Time Calculation (min): 55 min  PT Assessment / Plan / Recommendation Comments on Treatment Session  Home care unable to see pt before Friday - reviewed home ther ex program.  Wife present for stair training.  Reviewed car transfers and shower transfers with pt and spouse    Follow Up Recommendations  Home health PT    Barriers to Discharge        Equipment Recommendations  None recommended by PT    Recommendations for Other Services    Frequency 7X/week   Plan Discharge plan remains appropriate    Precautions / Restrictions Precautions Precautions: Knee Restrictions Weight Bearing Restrictions: No Other Position/Activity Restrictions: WBAT   Pertinent Vitals/Pain 3/10    Mobility  Bed Mobility Bed Mobility: Sit to Supine Sit to Supine: 5: Supervision Details for Bed Mobility Assistance: min cues for use of R LE to self assist Transfers Transfers: Sit to Stand;Stand to Sit Sit to Stand: 5: Supervision Stand to Sit: 5: Supervision Details for Transfer Assistance: cues for LE management and use of UEs to self assist Ambulation/Gait Ambulation/Gait Assistance: 5: Supervision Ambulation Distance (Feet): 150 Feet Assistive device: Rolling walker Ambulation/Gait Assistance Details: min cues for posture and position from RW Gait Pattern: Step-to pattern Stairs: Yes Stairs Assistance: 4: Min assist Stairs Assistance Details (indicate cue type and reason): cues for sequence, foot/crutch/RW placement  Stair Management Technique: No rails;Backwards;Forwards;With crutches;With walker;Step to pattern Number of Stairs: 2  (twice)    Exercises Total Joint Exercises Ankle Circles/Pumps: AROM;10 reps;Supine;Both Quad Sets: AROM;Both;10 reps;Supine Short Arc Quad: AROM;10 reps;Supine;Both Heel Slides: AAROM;15 reps;Left;Supine (educated on  use of sheet to self assist) Straight Leg Raises: AROM;15 reps;Supine;Left   PT Diagnosis:    PT Problem List:   PT Treatment Interventions:     PT Goals Acute Rehab PT Goals PT Goal Formulation: With patient Time For Goal Achievement: 07/30/12 Potential to Achieve Goals: Good Pt will go Supine/Side to Sit: with supervision PT Goal: Supine/Side to Sit - Progress: Met Pt will go Sit to Supine/Side: with supervision PT Goal: Sit to Supine/Side - Progress: Met Pt will go Sit to Stand: with supervision PT Goal: Sit to Stand - Progress: Met Pt will go Stand to Sit: with supervision PT Goal: Stand to Sit - Progress: Met Pt will Ambulate: 51 - 150 feet;with supervision;with rolling walker PT Goal: Ambulate - Progress: Met Pt will Go Up / Down Stairs: 1-2 stairs;with min assist;with least restrictive assistive device PT Goal: Up/Down Stairs - Progress: Met  Visit Information  Last PT Received On: 07/27/12 Assistance Needed: +1    Subjective Data  Patient Stated Goal: Resume previous lifestyle with decreased pain   Cognition  Overall Cognitive Status: Appears within functional limits for tasks assessed/performed Arousal/Alertness: Awake/alert Orientation Level: Appears intact for tasks assessed Behavior During Session: [facility] for tasks performed    Balance     End of Session PT - End of Session Equipment Utilized During Treatment: Gait belt Activity Tolerance: Patient tolerated treatment well Patient left: in bed;with call bell/phone within reach;with family/visitor present Nurse Communication: Mobility status   GP     [name] 07/27/2012, 2:42 PM

## 2012-07-27 NOTE — Progress Notes (Signed)
OT Note:  Spoke to pt/wife.  Pt is being discharged today, and they feel comfortable with bathroom transfers as well as ADLs.  Wife will assist pt.  [place],  [phone] 07/27/2012

## 2012-07-27 NOTE — Evaluation (Signed)
Physical Therapy Evaluation Patient Details Name: [name] MRN: [id number] DOB: 05-06-1946 Today's Date: 07/27/2012 Time: 4098-1191 PT Time Calculation (min): 38 min  PT Assessment / Plan / Recommendation Clinical Impression  Pt with L TKR presents with decreased L LE strength/ROm and limitations in functional mobility    PT Assessment  Patient needs continued PT services    Follow Up Recommendations  Home health PT    Barriers to Discharge        Equipment Recommendations  None recommended by PT    Recommendations for Other Services     Frequency 7X/week    Precautions / Restrictions Precautions Precautions: Knee Required Braces or Orthoses:  (Pt performed IND SLR this am) Restrictions Weight Bearing Restrictions: No Other Position/Activity Restrictions: WBAT   Pertinent Vitals/Pain 4/10; pt premedicated, cold packs provided      Mobility  Bed Mobility Bed Mobility: Supine to Sit Supine to Sit: 4: Min guard Details for Bed Mobility Assistance: min cues for use of R LE to self assist Transfers Transfers: Sit to Stand;Stand to Sit Sit to Stand: 4: Min guard Stand to Sit: 4: Min guard Details for Transfer Assistance: cues for LE management and use of UEs to self assist Ambulation/Gait Ambulation/Gait Assistance: 4: Min guard Ambulation Distance (Feet): 123 Feet Assistive device: Rolling walker Ambulation/Gait Assistance Details: cues for sequence, stride length, postion from RW and posture Gait Pattern: Step-to pattern    Exercises Total Joint Exercises Ankle Circles/Pumps: AROM;10 reps;Supine;Both Quad Sets: AROM;Both;10 reps;Supine Heel Slides: AAROM;15 reps;Left;Supine Straight Leg Raises: AROM;15 reps;Supine;Left   PT Diagnosis: Difficulty walking  PT Problem List: Decreased strength;Decreased range of motion;Decreased activity tolerance;Decreased mobility;Decreased knowledge of use of DME;Pain PT Treatment Interventions: DME instruction;Gait  training;Stair training;Functional mobility training;Therapeutic activities;Therapeutic exercise;Patient/family education   PT Goals Acute Rehab PT Goals PT Goal Formulation: With patient Time For Goal Achievement: 07/30/12 Potential to Achieve Goals: Good Pt will go Supine/Side to Sit: with supervision PT Goal: Supine/Side to Sit - Progress: Goal set today Pt will go Sit to Supine/Side: with supervision PT Goal: Sit to Supine/Side - Progress: Goal set today Pt will go Sit to Stand: with supervision PT Goal: Sit to Stand - Progress: Goal set today Pt will go Stand to Sit: with supervision PT Goal: Stand to Sit - Progress: Goal set today Pt will Ambulate: 51 - 150 feet;with supervision;with rolling walker PT Goal: Ambulate - Progress: Goal set today Pt will Go Up / Down Stairs: 1-2 stairs;with min assist;with least restrictive assistive device PT Goal: Up/Down Stairs - Progress: Goal set today  Visit Information  Last PT Received On: 07/27/12 Assistance Needed: +1    Subjective Data  Subjective: I'm gonig home today if I do well Patient Stated Goal: Resume previous lifestyle with decreased pain   Prior Functioning  Home Living Lives With: Spouse Available Help at Discharge: Family Type of Home: House Home Access: Stairs to enter Secretary/administrator of Steps: 1 Entrance Stairs-Rails: None Home Layout: One level Home Adaptive Equipment: Walker - rolling Prior Function Level of Independence: Independent Able to Take Stairs?: Yes Driving: Yes Communication Communication: No difficulties    Cognition  Overall Cognitive Status: Appears within functional limits for tasks assessed/performed Arousal/Alertness: Awake/alert Orientation Level: Appears intact for tasks assessed Behavior During Session: [facility] for tasks performed    Extremity/Trunk Assessment Right Upper Extremity Assessment RUE ROM/Strength/Tone: [facility] for tasks assessed Left Upper Extremity Assessment LUE  ROM/Strength/Tone: [facility] for tasks assessed Right Lower Extremity Assessment RLE ROM/Strength/Tone: [facility] for  tasks assessed Left Lower Extremity Assessment LLE ROM/Strength/Tone: Deficits LLE ROM/Strength/Tone Deficits: 3/5 Quads, -5 - 85 aarom at knee   Balance    End of Session PT - End of Session Activity Tolerance: Patient tolerated treatment well Patient left: in chair;with call bell/phone within reach Nurse Communication: Mobility status  GP     Morghan Kester 07/27/2012, 12:06 PM

## 2012-07-28 NOTE — Care Management Note (Signed)
    Page 1 of 2   07/28/2012     2:43:43 PM   CARE MANAGEMENT NOTE 07/28/2012  Patient:  JAXTEN, BROSH   Account Number:  [id number]  Date Initiated:  07/27/2012  Documentation initiated by:  Colleen Can  Subjective/Objective Assessment:   dx osteoarthritis left knee; total knee replacemnt  Referral to [facility] from doctor's office for hh services     Action/Plan:   Plans home with spouse as caregiver. No DME needs. [facility] will start services 07/29/2012   Anticipated DC Date:  07/28/2012   Anticipated DC Plan:  HOME W HOME HEALTH SERVICES  In-house referral  NA      DC Planning Services  CM consult      [facility] Choice  HOME HEALTH   Choice offered to / List presented to:  C-1 Patient   DME arranged  NA      DME agency  NA     HH arranged  HH-2 PT      [facility] agency  [facility]   Status of service:  Completed, signed off Medicare Important Message given?  NO (If response is "NO", the following Medicare IM given date fields will be blank) Date Medicare IM given:   Date Additional Medicare IM given:    Discharge Disposition:  HOME W HOME HEALTH SERVICES  Per UR Regulation:    If discussed at Long Length of Stay Meetings, dates discussed:    Comments:

## 2012-08-18 ENCOUNTER — Other Ambulatory Visit: Payer: Self-pay | Admitting: Cardiovascular Disease

## 2012-08-18 NOTE — Telephone Encounter (Signed)
Fax Received. Refill Completed. [name] (R.M.A)   

## 2012-09-01 ENCOUNTER — Other Ambulatory Visit: Payer: Self-pay | Admitting: *Deleted

## 2012-09-01 MED ORDER — CLOPIDOGREL BISULFATE 75 MG PO TABS: 75.0000 mg | ORAL_TABLET | Freq: Every morning | ORAL | Status: DC

## 2012-09-01 NOTE — Telephone Encounter (Signed)
Fax Received. Refill Completed. [name] (R.M.A)   

## 2012-10-13 ENCOUNTER — Other Ambulatory Visit (HOSPITAL_COMMUNITY): Payer: Self-pay | Admitting: *Deleted

## 2012-10-18 ENCOUNTER — Encounter (HOSPITAL_COMMUNITY): Payer: Self-pay | Admitting: Pharmacy Technician

## 2012-10-20 ENCOUNTER — Encounter (HOSPITAL_COMMUNITY)
Admission: RE | Admit: 2012-10-20 | Discharge: 2012-10-20 | Disposition: A | Discharge status: Home / Self Care | Payer: Medicare Other | Source: Ambulatory Visit | Attending: Orthopedic Surgery | Admitting: Orthopedic Surgery

## 2012-10-20 ENCOUNTER — Encounter (HOSPITAL_COMMUNITY): Payer: Self-pay

## 2012-10-20 HISTORY — DX: Restless legs syndrome: G25.81

## 2012-10-20 LAB — CBC
HCT: 42.7 % (ref 39.0–52.0)
MCHC: 34.9 g/dL (ref 30.0–36.0)
MCV: 88.8 fL (ref 78.0–100.0)
RDW: 14.3 % (ref 11.5–15.5)

## 2012-10-20 LAB — BASIC METABOLIC PANEL
BUN: 15 mg/dL (ref 6–23)
CO2: 25 mEq/L (ref 19–32)
Chloride: 102 mEq/L (ref 96–112)
Creatinine, Ser: 1 mg/dL (ref 0.50–1.35)
Glucose, Bld: 96 mg/dL (ref 70–99)

## 2012-10-20 LAB — URINALYSIS, ROUTINE W REFLEX MICROSCOPIC
Glucose, UA: NEGATIVE mg/dL
Leukocytes, UA: NEGATIVE
Nitrite: NEGATIVE
Protein, ur: NEGATIVE mg/dL
pH: 7 (ref 5.0–8.0)

## 2012-10-20 NOTE — Pre-Procedure Instructions (Signed)
CBC, BMET, PT, PTT, UA, T/S WERE DONE TODAY - PREOP AT [facility] AS PER ORDERS DR. Charlann Boxer. PT HAS CXR REPORT IN EPIC FROM 07/06/12 AND HAS EKG AND OFFICE VISIT WITH DR. Sherlon Handing FROM 01/13/12 IN EPIC.

## 2012-10-20 NOTE — Patient Instructions (Signed)
YOUR SURGERY IS SCHEDULED AT [facility]  ON:  Tuesday  11/19  AT 10:45 AM  REPORT TO [place] [facility] AT:  8:15 AM       PHONE # FOR SHORT STAY IS [phone]  DO NOT EAT OR DRINK ANYTHING AFTER MIDNIGHT THE NIGHT BEFORE YOUR SURGERY.  YOU MAY BRUSH YOUR TEETH, RINSE OUT YOUR MOUTH--BUT NO WATER, NO FOOD, NO CHEWING GUM, NO MINTS, NO CANDIES, NO CHEWING TOBACCO.  PLEASE TAKE THE FOLLOWING MEDICATIONS THE AM OF YOUR SURGERY WITH A FEW SIPS OF WATER:  METOPROLOL, ASPIRIN, BENADRYL   IF YOU USE INHALERS--USE YOUR INHALERS THE AM OF YOUR SURGERY AND BRING INHALERS TO THE HOSPITAL -TAKE TO SURGERY.    IF YOU ARE DIABETIC:  DO NOT TAKE ANY DIABETIC MEDICATIONS THE AM OF YOUR SURGERY.  IF YOU TAKE INSULIN IN THE EVENINGS--PLEASE ONLY TAKE 1/2 NORMAL EVENING DOSE THE NIGHT BEFORE YOUR SURGERY.  NO INSULIN THE AM OF YOUR SURGERY.  IF YOU HAVE SLEEP APNEA AND USE CPAP OR BIPAP--PLEASE BRING THE MASK AND THE TUBING.  DO NOT BRING YOUR MACHINE.  DO NOT BRING VALUABLES, MONEY, CREDIT CARDS.  DO NOT WEAR JEWELRY, MAKE-UP, NAIL POLISH AND NO METAL PINS OR CLIPS IN YOUR HAIR. CONTACT LENS, DENTURES / PARTIALS, GLASSES SHOULD NOT BE WORN TO SURGERY AND IN MOST CASES-HEARING AIDS WILL NEED TO BE REMOVED.  BRING YOUR GLASSES CASE, ANY EQUIPMENT NEEDED FOR YOUR CONTACT LENS. FOR PATIENTS ADMITTED TO THE HOSPITAL--CHECK OUT TIME THE DAY OF DISCHARGE IS 11:00 AM.  ALL INPATIENT ROOMS ARE PRIVATE - WITH BATHROOM, TELEPHONE, TELEVISION AND WIFI INTERNET.  IF YOU ARE BEING DISCHARGED THE SAME DAY OF YOUR SURGERY--YOU CAN NOT DRIVE YOURSELF HOME--AND SHOULD NOT GO HOME ALONE BY TAXI OR BUS.  NO DRIVING OR OPERATING MACHINERY FOR 24 HOURS FOLLOWING ANESTHESIA / PAIN MEDICATIONS.  PLEASE MAKE ARRANGEMENTS FOR SOMEONE TO BE WITH YOU AT HOME THE FIRST 24 HOURS AFTER SURGERY. RESPONSIBLE DRIVER'S NAME___________________________                                               PHONE #    _______________________                                  PLEASE READ OVER ANY  FACT SHEETS THAT YOU WERE GIVEN: MRSA INFORMATION, BLOOD TRANSFUSION INFORMATION, INCENTIVE SPIROMETER INFORMATION.

## 2012-10-23 NOTE — H&P (Signed)
TOTAL KNEE ADMISSION H&P  Patient is being admitted for right total knee arthroplasty.  Subjective:  Chief Complaint:  Right knee OA / pain.  HPI: RIGDON RECCHIA, 66 y.o. male, has a history of pain and functional disability in the right knee due to arthritis and has failed non-surgical conservative treatments for greater than 12 weeks to includeNSAID's and/or analgesics, corticosteriod injections and activity modification.  Onset of symptoms was gradual, starting 2 years ago with gradually worsening course since that time. The patient noted no past surgery on the right knee(s).  Patient currently rates pain in the right knee(s) at 8 out of 10 with activity. Patient has worsening of pain with activity and weight bearing, pain that interferes with activities of daily living, crepitus and joint swelling.  Patient has evidence of periarticular osteophytes and joint space narrowing by imaging studies. Risks, benefits and expectations were discussed with the patient. Patient understand the risks, benefits and expectations and wishes to proceed with surgery.   D/C Plans: Home with HHPT  Post-op Meds:  No Rx given - Has all OTC meds from last surgery, will need Rx for Norco and Robaxin on D/C  Tranexamic Acid:  Not to be given - CAD  Decadron:   To be given   Patient Active Problem List   Diagnosis Date Noted  . S/P left TKA 07/26/2012  . CAD (coronary artery disease) 03/31/2011  . Hyperlipemia 03/31/2011   Past Medical History  Diagnosis Date  . Coronary artery disease     PTCA/stenting ramus inter. vess.; 10/2008  . Hyperlipidemia   . Hypertension   . Chronic knee pain   . Seasonal allergies     FREQENT BRONCHITIS. SINUS INFECTIONS WITH SEASON CHANGES  . Asthma     AS A CHILD  . Shingles DEC 2012    MILD CASE-NO RESIDUAL PROBLEMS  . Hernia     "AT MY BELLY BUTTON" - NO PAIN OR PROBLEMS  . GERD (gastroesophageal reflux disease)     MILD--WOULD TAKE ROLAID IF NEEDED  . Arthritis       OA AND PAIN BOTH KNEES  . Sleep apnea 07/06/12    STOP BANG SCORE OF 4  . Restless leg syndrome     Past Surgical History  Procedure Date  . Knee surgery     right and left  . Cardiac catheterization     10/30/2008;  PTCA/stent ramus interm. vess.  . Coronary angioplasty   . Total knee arthroplasty 07/26/2012    Procedure: TOTAL KNEE ARTHROPLASTY;  Surgeon: [name], MD;  Location: [facility];  Service: Orthopedics;  Laterality: Left;    No prescriptions prior to admission   Allergies  Allergen Reactions  . Clarithromycin Other (See Comments)    Reaction=dizziness    History  Substance Use Topics  . Smoking status: Never Smoker   . Smokeless tobacco: Former Neurosurgeon  . Alcohol Use: Yes     Comment: DAILY - ONE OR TWO BEERS OR WINE       Review of Systems  Constitutional: Negative.   HENT: Negative.   Eyes: Negative.   Respiratory: Negative.   Cardiovascular: Negative.   Gastrointestinal: Negative.   Genitourinary: Negative.   Musculoskeletal: Positive for joint pain.  Skin: Negative.   Neurological: Negative.   Endo/Heme/Allergies: Negative.   Psychiatric/Behavioral: Negative.     Objective:  Physical Exam  Constitutional: He is oriented to person, place, and time. He appears well-developed and well-nourished.  HENT:  Head: Normocephalic and  atraumatic.  Mouth/Throat: Oropharynx is clear and moist.  Neck: Neck supple. No JVD present. No tracheal deviation present. No thyromegaly present.  Cardiovascular: Normal rate, regular rhythm, normal heart sounds and intact distal pulses.   Respiratory: Effort normal and breath sounds normal. No stridor. No respiratory distress. He has no wheezes.  GI: Soft. There is no tenderness. There is no guarding.  Musculoskeletal:       Right knee: He exhibits decreased range of motion (ROM with crepitus), swelling, effusion and bony tenderness. He exhibits no ecchymosis and no erythema. tenderness found.  Lymphadenopathy:     He has no cervical adenopathy.  Neurological: He is alert and oriented to person, place, and time.  Skin: Skin is warm and dry.  Psychiatric: He has a normal mood and affect.    Vital signs in last 24 hours: BP :  173/95 ; HR : 64 ; Resp : 18 ;  Labs:  Estimated Body mass index is 29.22 kg/(m^2) as calculated from the following:   Height as of 07/06/12: 6\' 3" (1.905 m).   Weight as of 07/06/12: 233 lb 12.8 oz(106.051 kg).   Imaging Review Plain radiographs demonstrate severe degenerative joint disease of the right knee(s). The overall alignment isneutral. The bone quality appears to be good for age and reported activity level.  Assessment/Plan:  End stage arthritis, right knee   The patient history, physical examination, clinical judgment of the provider and imaging studies are consistent with end stage degenerative joint disease of the right knee(s) and total knee arthroplasty is deemed medically necessary. The treatment options including medical management, injection therapy arthroscopy and arthroplasty were discussed at length. The risks and benefits of total knee arthroplasty were presented and reviewed. The risks due to aseptic loosening, infection, stiffness, patella tracking problems, thromboembolic complications and other imponderables were discussed. The patient acknowledged the explanation, agreed to proceed with the plan and consent was signed. Patient is being admitted for inpatient treatment for surgery, pain control, PT, OT, prophylactic antibiotics, VTE prophylaxis, progressive ambulation and ADL's and discharge planning. The patient is planning to be discharged home with home health services.     Anastasio Auerbach Jaryd Drew   PAC  10/23/2012, 8:04 PM

## 2012-10-25 ENCOUNTER — Encounter (HOSPITAL_COMMUNITY)
Admission: RE | Disposition: A | Discharge status: Home Health | Payer: Self-pay | Source: Ambulatory Visit | Attending: Orthopedic Surgery

## 2012-10-25 ENCOUNTER — Encounter (HOSPITAL_COMMUNITY): Payer: Self-pay | Admitting: Anesthesiology

## 2012-10-25 ENCOUNTER — Encounter (HOSPITAL_COMMUNITY): Payer: Self-pay | Admitting: *Deleted

## 2012-10-25 ENCOUNTER — Inpatient Hospital Stay (HOSPITAL_COMMUNITY): Payer: Medicare Other | Admitting: Anesthesiology

## 2012-10-25 ENCOUNTER — Inpatient Hospital Stay (HOSPITAL_COMMUNITY)
Admission: RE | Admit: 2012-10-25 | Discharge: 2012-10-26 | DRG: 470 | Disposition: A | Discharge status: Home Health | Payer: Medicare Other | Source: Ambulatory Visit | Attending: Orthopedic Surgery | Admitting: Orthopedic Surgery

## 2012-10-25 DIAGNOSIS — G4730 Sleep apnea, unspecified: Secondary | ICD-10-CM | POA: Diagnosis present

## 2012-10-25 DIAGNOSIS — Z96659 Presence of unspecified artificial knee joint: Secondary | ICD-10-CM

## 2012-10-25 DIAGNOSIS — I2510 Atherosclerotic heart disease of native coronary artery without angina pectoris: Secondary | ICD-10-CM | POA: Diagnosis present

## 2012-10-25 DIAGNOSIS — Z6828 Body mass index (BMI) 28.0-28.9, adult: Secondary | ICD-10-CM

## 2012-10-25 DIAGNOSIS — I10 Essential (primary) hypertension: Secondary | ICD-10-CM | POA: Diagnosis present

## 2012-10-25 DIAGNOSIS — M1710 Unilateral primary osteoarthritis, unspecified knee: Principal | ICD-10-CM | POA: Diagnosis present

## 2012-10-25 DIAGNOSIS — K219 Gastro-esophageal reflux disease without esophagitis: Secondary | ICD-10-CM | POA: Diagnosis present

## 2012-10-25 DIAGNOSIS — E663 Overweight: Secondary | ICD-10-CM | POA: Diagnosis present

## 2012-10-25 DIAGNOSIS — G2581 Restless legs syndrome: Secondary | ICD-10-CM | POA: Diagnosis present

## 2012-10-25 DIAGNOSIS — D62 Acute posthemorrhagic anemia: Secondary | ICD-10-CM | POA: Diagnosis not present

## 2012-10-25 DIAGNOSIS — E785 Hyperlipidemia, unspecified: Secondary | ICD-10-CM | POA: Diagnosis present

## 2012-10-25 HISTORY — PX: TOTAL KNEE ARTHROPLASTY: SHX125

## 2012-10-25 LAB — TYPE AND SCREEN

## 2012-10-25 SURGERY — ARTHROPLASTY, KNEE, TOTAL
Anesthesia: Spinal | Site: Knee | Laterality: Right | Wound class: Clean

## 2012-10-25 MED ORDER — POLYETHYLENE GLYCOL 3350 17 G PO PACK: 17.0000 g | PACK | Freq: Every day | ORAL | Status: DC | PRN

## 2012-10-25 MED ORDER — METHOCARBAMOL 100 MG/ML IJ SOLN
500.0000 mg | Freq: Four times a day (QID) | INTRAVENOUS | Status: DC | PRN
  Administered 2012-10-25: 500 mg via INTRAVENOUS
  Filled 2012-10-25: qty 5

## 2012-10-25 MED ORDER — ACETAMINOPHEN 10 MG/ML IV SOLN
INTRAVENOUS | Status: DC | PRN
  Administered 2012-10-25: 1000 mg via INTRAVENOUS

## 2012-10-25 MED ORDER — CEFAZOLIN SODIUM-DEXTROSE 2-3 GM-% IV SOLR
2.0000 g | Freq: Four times a day (QID) | INTRAVENOUS | Status: AC
  Administered 2012-10-25 (×2): 2 g via INTRAVENOUS
  Filled 2012-10-25 (×2): qty 50

## 2012-10-25 MED ORDER — PROMETHAZINE HCL 25 MG/ML IJ SOLN: 6.2500 mg | INTRAMUSCULAR | Status: DC | PRN

## 2012-10-25 MED ORDER — HYDROCODONE-ACETAMINOPHEN 7.5-325 MG PO TABS
1.0000 | ORAL_TABLET | ORAL | Status: DC
  Administered 2012-10-25 – 2012-10-26 (×4): 2 via ORAL
  Administered 2012-10-26: 1 via ORAL
  Administered 2012-10-26: 2 via ORAL
  Filled 2012-10-25 (×5): qty 2
  Filled 2012-10-25: qty 1

## 2012-10-25 MED ORDER — DIPHENHYDRAMINE HCL 12.5 MG/5ML PO ELIX: 25.0000 mg | ORAL_SOLUTION | Freq: Four times a day (QID) | ORAL | Status: DC | PRN

## 2012-10-25 MED ORDER — ONDANSETRON HCL 4 MG PO TABS: 4.0000 mg | ORAL_TABLET | Freq: Four times a day (QID) | ORAL | Status: DC | PRN

## 2012-10-25 MED ORDER — KETAMINE HCL 10 MG/ML IJ SOLN
INTRAMUSCULAR | Status: DC | PRN
  Administered 2012-10-25 (×4): 10 mg via INTRAVENOUS

## 2012-10-25 MED ORDER — BUPIVACAINE-EPINEPHRINE PF 0.25-1:200000 % IJ SOLN
INTRAMUSCULAR | Status: DC | PRN
  Administered 2012-10-25: 50 mL

## 2012-10-25 MED ORDER — NITROGLYCERIN 0.4 MG SL SUBL: 0.4000 mg | SUBLINGUAL_TABLET | SUBLINGUAL | Status: DC | PRN

## 2012-10-25 MED ORDER — ALUM & MAG HYDROXIDE-SIMETH 200-200-20 MG/5ML PO SUSP: 30.0000 mL | ORAL | Status: DC | PRN

## 2012-10-25 MED ORDER — HYDRALAZINE HCL 20 MG/ML IJ SOLN
INTRAMUSCULAR | Status: DC | PRN
  Administered 2012-10-25 (×2): 5 mg via INTRAVENOUS

## 2012-10-25 MED ORDER — FERROUS SULFATE 325 (65 FE) MG PO TABS
325.0000 mg | ORAL_TABLET | Freq: Three times a day (TID) | ORAL | Status: DC
  Administered 2012-10-25 – 2012-10-26 (×2): 325 mg via ORAL
  Filled 2012-10-25 (×5): qty 1

## 2012-10-25 MED ORDER — PROPOFOL 10 MG/ML IV EMUL
INTRAVENOUS | Status: DC | PRN
  Administered 2012-10-25: 75 ug/kg/min via INTRAVENOUS

## 2012-10-25 MED ORDER — DOCUSATE SODIUM 100 MG PO CAPS
100.0000 mg | ORAL_CAPSULE | Freq: Two times a day (BID) | ORAL | Status: DC
  Administered 2012-10-25 – 2012-10-26 (×2): 100 mg via ORAL

## 2012-10-25 MED ORDER — ATORVASTATIN CALCIUM 20 MG PO TABS
20.0000 mg | ORAL_TABLET | Freq: Every day | ORAL | Status: DC
  Administered 2012-10-25: 20 mg via ORAL
  Filled 2012-10-25 (×2): qty 1

## 2012-10-25 MED ORDER — DIPHENHYDRAMINE HCL 25 MG PO TABS
25.0000 mg | ORAL_TABLET | Freq: Every morning | ORAL | Status: DC
  Administered 2012-10-26: 25 mg via ORAL
  Filled 2012-10-25: qty 1

## 2012-10-25 MED ORDER — HYDROMORPHONE HCL PF 1 MG/ML IJ SOLN: 0.2000 mg | INTRAMUSCULAR | Status: DC | PRN

## 2012-10-25 MED ORDER — BUPIVACAINE IN DEXTROSE 0.75-8.25 % IT SOLN
INTRATHECAL | Status: DC | PRN
  Administered 2012-10-25: 2 mL via INTRATHECAL

## 2012-10-25 MED ORDER — CLONAZEPAM 0.5 MG PO TABS
0.5000 mg | ORAL_TABLET | Freq: Every evening | ORAL | Status: DC | PRN
  Administered 2012-10-25: 0.5 mg via ORAL
  Filled 2012-10-25: qty 1

## 2012-10-25 MED ORDER — MENTHOL 3 MG MT LOZG
1.0000 | LOZENGE | OROMUCOSAL | Status: DC | PRN
  Filled 2012-10-25: qty 9

## 2012-10-25 MED ORDER — LACTATED RINGERS IV SOLN
INTRAVENOUS | Status: DC
  Administered 2012-10-25: 1000 mL via INTRAVENOUS
  Administered 2012-10-25 (×2): via INTRAVENOUS

## 2012-10-25 MED ORDER — SODIUM CHLORIDE 0.9 % IR SOLN
Status: DC | PRN
  Administered 2012-10-25: 1000 mL

## 2012-10-25 MED ORDER — KETOROLAC TROMETHAMINE 30 MG/ML IJ SOLN
INTRAMUSCULAR | Status: DC | PRN
  Administered 2012-10-25: 30 mg

## 2012-10-25 MED ORDER — CLOPIDOGREL BISULFATE 75 MG PO TABS
75.0000 mg | ORAL_TABLET | Freq: Every morning | ORAL | Status: DC
  Administered 2012-10-26: 75 mg via ORAL
  Filled 2012-10-25: qty 1

## 2012-10-25 MED ORDER — ONDANSETRON HCL 4 MG/2ML IJ SOLN
INTRAMUSCULAR | Status: DC | PRN
  Administered 2012-10-25: 4 mg via INTRAVENOUS

## 2012-10-25 MED ORDER — HYDROMORPHONE HCL PF 1 MG/ML IJ SOLN
0.2500 mg | INTRAMUSCULAR | Status: DC | PRN
  Administered 2012-10-25 (×2): 0.5 mg via INTRAVENOUS

## 2012-10-25 MED ORDER — MEPERIDINE HCL 50 MG/ML IJ SOLN: 6.2500 mg | INTRAMUSCULAR | Status: DC | PRN

## 2012-10-25 MED ORDER — SENNA 8.6 MG PO TABS
1.0000 | ORAL_TABLET | Freq: Two times a day (BID) | ORAL | Status: DC
  Administered 2012-10-25 – 2012-10-26 (×2): 8.6 mg via ORAL
  Filled 2012-10-25 (×2): qty 1

## 2012-10-25 MED ORDER — METHOCARBAMOL 500 MG PO TABS
500.0000 mg | ORAL_TABLET | Freq: Four times a day (QID) | ORAL | Status: DC | PRN
  Administered 2012-10-26: 500 mg via ORAL
  Filled 2012-10-25: qty 1

## 2012-10-25 MED ORDER — FLUTICASONE PROPIONATE 50 MCG/ACT NA SUSP
1.0000 | Freq: Every day | NASAL | Status: DC
  Administered 2012-10-26: 1 via NASAL
  Filled 2012-10-25: qty 16

## 2012-10-25 MED ORDER — CHLORHEXIDINE GLUCONATE 4 % EX LIQD
60.0000 mL | Freq: Once | CUTANEOUS | Status: DC
  Filled 2012-10-25: qty 60

## 2012-10-25 MED ORDER — SODIUM CHLORIDE 0.9 % IV SOLN
INTRAVENOUS | Status: DC
  Administered 2012-10-25 – 2012-10-26 (×2): via INTRAVENOUS
  Filled 2012-10-25 (×5): qty 1000

## 2012-10-25 MED ORDER — PHENOL 1.4 % MT LIQD: 1.0000 | OROMUCOSAL | Status: DC | PRN

## 2012-10-25 MED ORDER — METOPROLOL TARTRATE 25 MG PO TABS
25.0000 mg | ORAL_TABLET | Freq: Two times a day (BID) | ORAL | Status: DC
  Administered 2012-10-25 – 2012-10-26 (×2): 25 mg via ORAL
  Filled 2012-10-25 (×5): qty 1

## 2012-10-25 MED ORDER — 0.9 % SODIUM CHLORIDE (POUR BTL) OPTIME
TOPICAL | Status: DC | PRN
  Administered 2012-10-25: 1000 mL

## 2012-10-25 MED ORDER — FENTANYL CITRATE 0.05 MG/ML IJ SOLN
INTRAMUSCULAR | Status: DC | PRN
  Administered 2012-10-25: 100 ug via INTRAVENOUS

## 2012-10-25 MED ORDER — CEFAZOLIN SODIUM-DEXTROSE 2-3 GM-% IV SOLR
2.0000 g | INTRAVENOUS | Status: AC
  Administered 2012-10-25: 2 g via INTRAVENOUS

## 2012-10-25 MED ORDER — ASPIRIN EC 81 MG PO TBEC
81.0000 mg | DELAYED_RELEASE_TABLET | Freq: Every morning | ORAL | Status: DC
  Administered 2012-10-26: 81 mg via ORAL
  Filled 2012-10-25: qty 1

## 2012-10-25 MED ORDER — ONDANSETRON HCL 4 MG/2ML IJ SOLN: 4.0000 mg | Freq: Four times a day (QID) | INTRAMUSCULAR | Status: DC | PRN

## 2012-10-25 MED ORDER — DEXAMETHASONE SODIUM PHOSPHATE 10 MG/ML IJ SOLN
10.0000 mg | Freq: Once | INTRAMUSCULAR | Status: AC
  Administered 2012-10-25: 10 mg via INTRAVENOUS
  Filled 2012-10-25: qty 1

## 2012-10-25 MED ORDER — MIDAZOLAM HCL 5 MG/5ML IJ SOLN
INTRAMUSCULAR | Status: DC | PRN
  Administered 2012-10-25: 2 mg via INTRAVENOUS

## 2012-10-25 MED ORDER — LACTATED RINGERS IV SOLN: INTRAVENOUS | Status: DC

## 2012-10-25 SURGICAL SUPPLY — 55 items

## 2012-10-25 NOTE — Op Note (Signed)
NAME:  OSTEN JANEK                      MEDICAL RECORD NO.:  [id number]                             FACILITY:  [facility]      PHYSICIAN:  Madlyn Frankel. Charlann Boxer, M.D.  DATE OF BIRTH:  Sep 18, 1946      DATE OF PROCEDURE:  10/25/2012                                     OPERATIVE REPORT         PREOPERATIVE DIAGNOSIS:  Right knee osteoarthritis.      POSTOPERATIVE DIAGNOSIS:  Right knee osteoarthritis.      FINDINGS:  The patient was noted to have complete loss of cartilage and   bone-on-bone arthritis with associated osteophytes in all three compartments of   the knee with a significant synovitis and associated effusion.      PROCEDURE:  Right total knee replacement.      COMPONENTS USED:  DePuy rotating platform posterior stabilized knee   system, a size 5 femur, 5 tibia, 12.5 mm PS insert, and 41 patellar   button.      SURGEON:  Madlyn Frankel. Charlann Boxer, M.D.      ASSISTANT:  Leilani Able, PA-C.      ANESTHESIA:  Spinal.      SPECIMENS:  None.      COMPLICATION:  None.      DRAINS:  One Hemovac.  EBL: <150cc      TOURNIQUET TIME:   Total Tourniquet Time Documented: Thigh (Right) - 45 minutes .      The patient was stable to the recovery room.      INDICATION FOR PROCEDURE:  KAMRAN COKER is a 66 y.o. male patient of   mine.  The patient had been seen, evaluated, and treated conservatively in the   office with medication, activity modification, and injections.  The patient had   radiographic changes of bone-on-bone arthritis with endplate sclerosis and osteophytes noted.      The patient failed conservative measures including medication, injections, and activity modification, and at this point was ready for more definitive measures.   Based on the radiographic changes and failed conservative measures, the patient   decided to proceed with total knee replacement.  Risks of infection,   DVT, component failure, need for revision surgery, postop course, and   expectations were all     discussed and reviewed.  Consent was obtained for benefit of pain   relief.      PROCEDURE IN DETAIL:  The patient was brought to the operative theater.   Once adequate anesthesia, preoperative antibiotics, 2 gm of Ancef administered, the patient was positioned supine with the right thigh tourniquet placed.  The  right lower extremity was prepped and draped in sterile fashion.  A time-   out was performed identifying the patient, planned procedure, and   extremity.      The right lower extremity was placed in the [facility] leg holder.  The leg was   exsanguinated, tourniquet elevated to 250 mmHg.  A midline incision was   made followed by median parapatellar arthrotomy.  Following initial   exposure, attention was first directed to the patella.  Precut  measurement was noted to be 27 mm.  I resected down to 15 mm and used a   41 patellar button to restore patellar height as well as cover the cut   surface.      The lug holes were drilled and a metal shim was placed to protect the   patella from retractors and saw blades.      At this point, attention was now directed to the femur.  The femoral   canal was opened with a drill, irrigated to try to prevent fat emboli.  An   intramedullary rod was passed at 5 degrees valgus, 10 mm of bone was   resected off the distal femur.  Following this resection, the tibia was   subluxated anteriorly.  Using the extramedullary guide, 10 mm of bone was resected off   the proximal lateral tibia.  We confirmed the gap would be   stable medially and laterally with a 10 mm insert as well as confirmed   the cut was perpendicular in the coronal plane, checking with an alignment rod.      Once this was done, I sized the femur to be a size 5 in the anterior-   posterior dimension, chose a standard component based on medial and   lateral dimension.  The size 5 rotation block was then pinned in   position anterior referenced using the C-clamp to set rotation.   The   anterior, posterior, and  chamfer cuts were made without difficulty nor   notching making certain that I was along the anterior cortex to help   with flexion gap stability.      The final box cut was made off the lateral aspect of distal femur.      At this point, the tibia was sized to be a size 5, the size 5 tray was   then pinned in position through the medial third of the tubercle,   drilled, and keel punched.  Trial reduction was now carried with a 5 femur,  5 tibia, a 12.5 mm insert, and the 41 patella botton.  The knee was brought to   extension, full extension with good flexion stability with the patella   tracking through the trochlea without application of pressure.  Given   all these findings, the trial components removed.  Final components were   opened and cement was mixed.  The knee was irrigated with normal saline   solution and pulse lavage.  The synovial lining was   then injected with 0.25% Marcaine with epinephrine and 1 cc of Toradol,   total of 61 cc.      The knee was irrigated.  Final implants were then cemented onto clean and   dried cut surfaces of bone with the knee brought to extension with a 12.5 mm trial insert.      Once the cement had fully cured, the excess cement was removed   throughout the knee.  I confirmed I was satisfied with the range of   motion and stability, and the final 12.5 mm insert was chosen.  It was   placed into the knee.      The tourniquet had been let down at 44 minutes.  No significant   hemostasis required.  The medium Hemovac drain was placed deep.  The   extensor mechanism was then reapproximated using #1 Vicryl with the knee   in flexion.  The   remaining wound was closed with 2-0 Vicryl and running  4-0 Monocryl.   The knee was cleaned, dried, dressed sterilely using Dermabond and   Aquacel dressing.  Drain site dressed separately.  The patient was then   brought to recovery room in stable condition, tolerating the  procedure   well.   Please note that Physician Assistant, Leilani Able, was present for the entirety of the case, and was utilized for pre-operative positioning, peri-operative retractor management, general facilitation of the procedure.  He was also utilized for primary wound closure at the end of the case.              Madlyn Frankel Charlann Boxer, M.D.

## 2012-10-25 NOTE — Interval H&P Note (Signed)
History and Physical Interval Note:  10/25/2012 9:04 AM  [name]  has presented today for surgery, with the diagnosis of Osteoarthritis of the Right Knee  The various methods of treatment have been discussed with the patient and family. After consideration of risks, benefits and other options for treatment, the patient has consented to  Procedure(s) (LRB) with comments: TOTAL KNEE ARTHROPLASTY (Right) as a surgical intervention .  The patient's history has been reviewed, patient examined, no change in status, stable for surgery.  I have reviewed the patient's chart and labs.  Questions were answered to the patient's satisfaction.     [name]

## 2012-10-25 NOTE — Anesthesia Procedure Notes (Signed)
Spinal  Patient location during procedure: OR End time: 10/25/2012 10:30 AM Staffing CRNA/Resident: Enriqueta Shutter Performed by: resident/CRNA  Preanesthetic Checklist Completed: patient identified, site marked, surgical consent, pre-op evaluation, timeout performed, IV checked, risks and benefits discussed and monitors and equipment checked Spinal Block Patient position: sitting Prep: Betadine Patient monitoring: continuous pulse ox and blood pressure Approach: midline Location: L3-4 Injection technique: single-shot Needle Needle type: Sprotte  Needle gauge: 24 G Needle length: 9 cm Assessment Sensory level: T6 Additional Notes Clear CSF -H-P

## 2012-10-25 NOTE — Evaluation (Signed)
Physical Therapy Evaluation Patient Details Name: [name] MRN: [id number] DOB: 05/30/1946 Today's Date: 10/25/2012 Time: 1700-1720 PT Time Calculation (min): 20 min  PT Assessment / Plan / Recommendation Clinical Impression  66 yo male s/p R TKA POD 0. Mobilizing well. C/o mild-moderate dizziness towards end of session. Anticipate pt will progress well during stay. Recommend HHPT to improve strength, activity tolerance, and maximize independence with functional mobility.     PT Assessment  Patient needs continued PT services    Follow Up Recommendations  Home health PT    Does the patient have the potential to tolerate intense rehabilitation      Barriers to Discharge        Equipment Recommendations  None recommended by PT    Recommendations for Other Services     Frequency 7X/week    Precautions / Restrictions Precautions Precautions: Knee Required Braces or Orthoses: Knee Immobilizer - Right Knee Immobilizer - Right: Discontinue once straight leg raise with < 10 degree lag Restrictions Weight Bearing Restrictions: No RLE Weight Bearing: Weight bearing as tolerated   Pertinent Vitals/Pain 3-4/10 Rknee      Mobility  Bed Mobility Bed Mobility: Supine to Sit Supine to Sit: 4: Min assist Details for Bed Mobility Assistance: Assist for R LE off bed. VCs safety, technique Transfers Transfers: Sit to Stand;Stand to Sit Sit to Stand: From elevated surface;From bed;4: Min assist Stand to Sit: 4: Min assist;To chair/3-in-1;With armrests;With upper extremity assist Details for Transfer Assistance: VCS safety,technique,hand placement. 2 attempts to rise. Elevated bed on 2nd attempt. Assist to control descent.  Ambulation/Gait Ambulation/Gait Assistance: 4: Min assist Ambulation Distance (Feet): 65 Feet Assistive device: Rolling walker Ambulation/Gait Assistance Details: VCS safety, technique, sequence, step length,posture. Assist to stabilize throughout  ambulation. Pt c/o some mild-moderate dizziness towards end of walk.  Gait Pattern: Step-to pattern;Antalgic;Decreased stride length    Shoulder Instructions     Exercises     PT Diagnosis: Difficulty walking;Abnormality of gait;Acute pain  PT Problem List: Decreased strength;Decreased range of motion;Decreased activity tolerance;Decreased mobility;Pain PT Treatment Interventions: DME instruction;Gait training;Stair training;Functional mobility training;Therapeutic activities;Therapeutic exercise;Patient/family education   PT Goals Acute Rehab PT Goals PT Goal Formulation: With patient Time For Goal Achievement: 11/01/12 Potential to Achieve Goals: Good Pt will go Supine/Side to Sit: with supervision PT Goal: Supine/Side to Sit - Progress: Goal set today Pt will go Sit to Supine/Side: with supervision PT Goal: Sit to Supine/Side - Progress: Goal set today Pt will go Sit to Stand: with supervision PT Goal: Sit to Stand - Progress: Goal set today Pt will Ambulate: 51 - 150 feet;with supervision;with least restrictive assistive device PT Goal: Ambulate - Progress: Goal set today Pt will Go Up / Down Stairs: 1-2 stairs;with supervision;with least restrictive assistive device PT Goal: Up/Down Stairs - Progress: Goal set today  Visit Information  Last PT Received On: 10/25/12 Assistance Needed: +1    Subjective Data  Subjective: "I already know about the exercises from last time" Patient Stated Goal: Home   Prior Functioning  Home Living Lives With: Spouse Available Help at Discharge: Family Type of Home: House Home Access: Stairs to enter [employer] of Steps: garage-2.  Entrance Stairs-Rails: None Home Layout: One level Bathroom Shower/Tub: Health visitor: Standard Home Adaptive Equipment: Walker - rolling;Straight cane;Crutches Prior Function Level of Independence: Independent Able to Take Stairs?: Yes Driving:  Yes Communication Communication: No difficulties    Cognition  Overall Cognitive Status: Appears within functional limits for tasks assessed/performed Arousal/Alertness:  Awake/alert Orientation Level: Appears intact for tasks assessed Behavior During Session: [facility] for tasks performed    Extremity/Trunk Assessment Right Lower Extremity Assessment RLE ROM/Strength/Tone: Deficits RLE ROM/Strength/Tone Deficits: SLR 3/5. moves ankle well.  RLE Sensation Deficits: pt c/o buttocks numbness Left Lower Extremity Assessment LLE ROM/Strength/Tone: Deficits LLE ROM/Strength/Tone Deficits: Knee flexion ~ 85-90 degrees Trunk Assessment Trunk Assessment: Normal   Balance    End of Session PT - End of Session Equipment Utilized During Treatment: Gait belt;Right knee immobilizer Activity Tolerance: Patient tolerated treatment well Patient left: in chair;with call bell/phone within reach  GP     Rebeca Alert [facility] 10/25/2012, 5:28 PM [phone]

## 2012-10-25 NOTE — Anesthesia Preprocedure Evaluation (Addendum)
Anesthesia Evaluation  Patient identified by MRN, date of birth, ID band Patient awake    Reviewed: Allergy & Precautions, H&P , NPO status , Patient's Chart, lab work & pertinent test results  Airway Mallampati: II TM Distance: >3 FB Neck ROM: Full    Dental No notable dental hx.    Pulmonary neg pulmonary ROS, asthma ,  breath sounds clear to auscultation  Pulmonary exam normal       Cardiovascular hypertension, + CAD and + Cardiac Stents (2009) negative cardio ROS  Rhythm:Regular Rate:Normal     Neuro/Psych negative neurological ROS  negative psych ROS   GI/Hepatic negative GI ROS, Neg liver ROS,   Endo/Other  negative endocrine ROS  Renal/GU negative Renal ROS  negative genitourinary   Musculoskeletal negative musculoskeletal ROS (+)   Abdominal   Peds negative pediatric ROS (+)  Hematology negative hematology ROS (+)   Anesthesia Other Findings   Reproductive/Obstetrics negative OB ROS                           Anesthesia Physical Anesthesia Plan  ASA: III  Anesthesia Plan: Spinal   Post-op Pain Management:    Induction: Intravenous  Airway Management Planned: Simple Face Mask  Additional Equipment:   Intra-op Plan:   Post-operative Plan:   Informed Consent: I have reviewed the patients History and Physical, chart, labs and discussed the procedure including the risks, benefits and alternatives for the proposed anesthesia with the patient or authorized representative who has indicated his/her understanding and acceptance.   Dental advisory given  Plan Discussed with: CRNA  Anesthesia Plan Comments:         Anesthesia Quick Evaluation                                   Anesthesia Evaluation  Patient identified by MRN, date of birth, ID band Patient awake    Reviewed: Allergy & Precautions, H&P , NPO status , Patient's Chart, lab work & pertinent test  results  Airway Mallampati: II TM Distance: >3 FB Neck ROM: Full    Dental No notable dental hx.    Pulmonary neg pulmonary ROS,  breath sounds clear to auscultation  Pulmonary exam normal       Cardiovascular hypertension, Pt. on medications + CAD and + Cardiac Stents (2009) negative cardio ROS  Rhythm:Regular Rate:Normal     Neuro/Psych negative neurological ROS  negative psych ROS   GI/Hepatic negative GI ROS, Neg liver ROS, GERD-  ,  Endo/Other  negative endocrine ROS  Renal/GU negative Renal ROS  negative genitourinary   Musculoskeletal negative musculoskeletal ROS (+)   Abdominal   Peds negative pediatric ROS (+)  Hematology negative hematology ROS (+)   Anesthesia Other Findings   Reproductive/Obstetrics negative OB ROS                           Anesthesia Physical Anesthesia Plan  ASA: III  Anesthesia Plan: Spinal   Post-op Pain Management:    Induction: Intravenous  Airway Management Planned: Simple Face Mask  Additional Equipment:   Intra-op Plan:   Post-operative Plan:   Informed Consent: I have reviewed the patients History and Physical, chart, labs and discussed the procedure including the risks, benefits and alternatives for the proposed anesthesia with the patient or authorized representative who has indicated his/her understanding and  acceptance.   Dental advisory given  Plan Discussed with: CRNA  Anesthesia Plan Comments:         Anesthesia Quick Evaluation

## 2012-10-25 NOTE — Transfer of Care (Signed)
Immediate Anesthesia Transfer of Care Note  Patient: [name]  Procedure(s) Performed: Procedure(s) (LRB) with comments: TOTAL KNEE ARTHROPLASTY (Right)  Patient Location: PACU  Anesthesia Type:Regional  Level of Consciousness: awake, alert  and oriented  Airway & Oxygen Therapy: Patient Spontanous Breathing and Patient connected to face mask oxygen  Post-op Assessment: Report given to PACU RN and Post -op Vital signs reviewed and stable  Post vital signs: Reviewed and stable  Complications: No apparent anesthesia complications

## 2012-10-25 NOTE — Progress Notes (Signed)
Utilization review completed.  

## 2012-10-25 NOTE — Anesthesia Postprocedure Evaluation (Signed)
  Anesthesia Post-op Note  Patient: [name]  Procedure(s) Performed: Procedure(s) (LRB): TOTAL KNEE ARTHROPLASTY (Right)  Patient Location: PACU  Anesthesia Type: Spinal  Level of Consciousness: awake and alert   Airway and Oxygen Therapy: Patient Spontanous Breathing  Post-op Pain: mild  Post-op Assessment: Post-op Vital signs reviewed, Patient's Cardiovascular Status Stable, Respiratory Function Stable, Patent Airway and No signs of Nausea or vomiting  Post-op Vital Signs: stable  Complications: No apparent anesthesia complications

## 2012-10-26 DIAGNOSIS — Z96659 Presence of unspecified artificial knee joint: Secondary | ICD-10-CM

## 2012-10-26 LAB — CBC
Hemoglobin: 11.4 g/dL — ABNORMAL LOW (ref 13.0–17.0)
MCH: 30.4 pg (ref 26.0–34.0)
Platelets: 146 10*3/uL — ABNORMAL LOW (ref 150–400)
RBC: 3.75 MIL/uL — ABNORMAL LOW (ref 4.22–5.81)
WBC: 15.5 10*3/uL — ABNORMAL HIGH (ref 4.0–10.5)

## 2012-10-26 LAB — BASIC METABOLIC PANEL
CO2: 26 mEq/L (ref 19–32)
Chloride: 103 mEq/L (ref 96–112)
Glucose, Bld: 147 mg/dL — ABNORMAL HIGH (ref 70–99)
Potassium: 4.3 mEq/L (ref 3.5–5.1)
Sodium: 135 mEq/L (ref 135–145)

## 2012-10-26 MED ORDER — FERROUS SULFATE 325 (65 FE) MG PO TABS: 325.0000 mg | ORAL_TABLET | Freq: Three times a day (TID) | ORAL | Status: DC

## 2012-10-26 MED ORDER — METHOCARBAMOL 500 MG PO TABS: 500.0000 mg | ORAL_TABLET | Freq: Four times a day (QID) | ORAL | Status: DC | PRN

## 2012-10-26 MED ORDER — POLYETHYLENE GLYCOL 3350 17 G PO PACK: 17.0000 g | PACK | Freq: Every day | ORAL | Status: DC | PRN

## 2012-10-26 MED ORDER — DSS 100 MG PO CAPS: 100.0000 mg | ORAL_CAPSULE | Freq: Two times a day (BID) | ORAL | Status: DC

## 2012-10-26 MED ORDER — HYDROCODONE-ACETAMINOPHEN 7.5-325 MG PO TABS: 1.0000 | ORAL_TABLET | ORAL | Status: DC | PRN

## 2012-10-26 NOTE — Progress Notes (Signed)
OT Note:  Pt screened for OT.  He has had previous tka, has dme and assist at home.  [place], OTR/L [phone] 10/26/2012

## 2012-10-26 NOTE — Progress Notes (Signed)
CSW consulted for SNF placement. PN reviewed. PT is recommending HHPT following hospital d/c. RNCM will assist with d/c planning needs. CSW is available to assist if plan changes and SNF placement is required.  Cori Razor LCSW [phone]

## 2012-10-26 NOTE — Progress Notes (Signed)
Physical Therapy Treatment Patient Details Name: [name] MRN: [id number] DOB: [birth year] Today's Date: 10/26/2012 Time: 5621-3086 PT Time Calculation (min): 11 min  PT Assessment / Plan / Recommendation Comments on Treatment Session  2nd session to practice steps. All education completed. Ready for d/c home today. HHPT     Follow Up Recommendations  Home health PT     Does the patient have the potential to tolerate intense rehabilitation     Barriers to Discharge        Equipment Recommendations  None recommended by PT    Recommendations for Other Services    Frequency 7X/week   Plan Discharge plan remains appropriate    Precautions / Restrictions Precautions Precautions: Knee Required Braces or Orthoses: Knee Immobilizer - Right (Dc'd 10/26/12-able to SLR) Restrictions Weight Bearing Restrictions: No RLE Weight Bearing: Weight bearing as tolerated   Pertinent Vitals/Pain 5/10 R knee    Mobility  Bed Mobility Bed Mobility: Not assessed Supine to Sit: 6: Modified independent (Device/Increase time) Transfers Transfers: Sit to Stand;Stand to Sit Sit to Stand: 6: Modified independent (Device/Increase time);From chair/3-in-1;With armrests Stand to Sit: 6: Modified independent (Device/Increase time);To chair/3-in-1;With armrests Ambulation/Gait Ambulation/Gait Assistance: 5: Supervision Ambulation Distance (Feet): 75 Feet Assistive device: Rolling walker Ambulation/Gait Assistance Details: short gait distance just to stretch R LE/knee out-stiff from sitting Gait Pattern: Step-through pattern;Antalgic Stairs: Yes Stairs Assistance: 4: Min assist Stairs Assistance Details (indicate cue type and reason): VCs safety, sequence, technique. Wife present. Assist to stabilize walker only Stair Management Technique: Backwards;With walker Number of Stairs: 2     Exercises Total Joint Exercises Ankle Circles/Pumps: AROM;Both;15 reps;Supine Quad Sets: AROM;Both;15  reps;Supine Short Arc Quad: AROM;Right;15 reps;Supine Heel Slides: Right;AAROM;15 reps;Supine (pt used sheet) Hip ABduction/ADduction: AROM;Right;15 reps;Supine Straight Leg Raises: AROM;Right;15 reps;Supine   PT Diagnosis:    PT Problem List:   PT Treatment Interventions:     PT Goals Acute Rehab PT Goals Pt will go Supine/Side to Sit: with modified independence PT Goal: Supine/Side to Sit - Progress: Met Pt will go Sit to Stand: with modified independence PT Goal: Sit to Stand - Progress: Met Pt will Ambulate: 51 - 150 feet;with supervision;with least restrictive assistive device PT Goal: Ambulate - Progress: Met Pt will Go Up / Down Stairs: 1-2 stairs;with supervision;with least restrictive assistive device PT Goal: Up/Down Stairs - Progress: Progressing toward goal  Visit Information  Last PT Received On: 10/26/12 Assistance Needed: +1    Subjective Data  Subjective: "I just need to stretch it (knee) out a little bit" Patient Stated Goal: home   Cognition  Overall Cognitive Status: Appears within functional limits for tasks assessed/performed Arousal/Alertness: Awake/alert Orientation Level: Appears intact for tasks assessed Behavior During Session: [facility] for tasks performed    Balance     End of Session PT - End of Session Equipment Utilized During Treatment: Gait belt Activity Tolerance: Patient tolerated treatment well Patient left: in chair;with call bell/phone within reach;with family/visitor present   GP     [name] 10/26/2012, 1:15 PM [phone]

## 2012-10-26 NOTE — Progress Notes (Signed)
Physical Therapy Treatment Patient Details Name: [name] MRN: [id number] DOB: 1945/12/28 Today's Date: 10/26/2012 Time: 4098-1191 PT Time Calculation (min): 24 min  PT Assessment / Plan / Recommendation Comments on Treatment Session  Progressing well. Plans to d/c home today after 2nd session. HHPT     Follow Up Recommendations  Home health PT     Does the patient have the potential to tolerate intense rehabilitation     Barriers to Discharge        Equipment Recommendations  None recommended by PT    Recommendations for Other Services    Frequency 7X/week   Plan Discharge plan remains appropriate    Precautions / Restrictions Precautions Precautions: Knee Required Braces or Orthoses: Knee Immobilizer - Right (Dc'd 10/26/12-able to SLR) Restrictions Weight Bearing Restrictions: No RLE Weight Bearing: Weight bearing as tolerated   Pertinent Vitals/Pain 2-3/10 R knee    Mobility  Bed Mobility Bed Mobility: Supine to Sit Supine to Sit: 6: Modified independent (Device/Increase time) Transfers Transfers: Sit to Stand;Stand to Sit Sit to Stand: 5: Supervision;From bed;With upper extremity assist Stand to Sit: 5: Supervision;To chair/3-in-1;With armrests;With upper extremity assist Ambulation/Gait Ambulation/Gait Assistance: 5: Supervision Ambulation Distance (Feet): 160 Feet Assistive device: Rolling walker Ambulation/Gait Assistance Details: VCs safety.  Gait Pattern: Step-through pattern;Antalgic    Exercises Total Joint Exercises Ankle Circles/Pumps: AROM;Both;15 reps;Supine Quad Sets: AROM;Both;15 reps;Supine Short Arc Quad: AROM;Right;15 reps;Supine Heel Slides: Right;AAROM;15 reps;Supine (pt used sheet) Hip ABduction/ADduction: AROM;Right;15 reps;Supine Straight Leg Raises: AROM;Right;15 reps;Supine   PT Diagnosis:    PT Problem List:   PT Treatment Interventions:     PT Goals Acute Rehab PT Goals Pt will go Supine/Side to Sit: with modified  independence PT Goal: Supine/Side to Sit - Progress: Met Pt will go Sit to Stand: with supervision PT Goal: Sit to Stand - Progress: Met Pt will Ambulate: 51 - 150 feet;with supervision;with least restrictive assistive device PT Goal: Ambulate - Progress: Progressing toward goal  Visit Information  Last PT Received On: 10/26/12 Assistance Needed: +1    Subjective Data  Subjective: "I think I'm leaving today" Patient Stated Goal: home   Cognition  Overall Cognitive Status: Appears within functional limits for tasks assessed/performed Arousal/Alertness: Awake/alert Orientation Level: Appears intact for tasks assessed Behavior During Session: [facility] for tasks performed    Balance     End of Session PT - End of Session Activity Tolerance: Patient tolerated treatment well Patient left: in chair;with call bell/phone within reach   GP     Rebeca Alert [facility] 10/26/2012, 11:58 AM [phone]

## 2012-10-26 NOTE — Progress Notes (Signed)
   Subjective: 1 Day Post-Op Procedure(s) (LRB): TOTAL KNEE ARTHROPLASTY (Right)   Patient reports pain as mild, pain well controlled. No events throughout the night. Ready to be discharged home.  Objective:   VITALS:   Filed Vitals:   10/26/12 1000  BP: 120/62  Pulse: 62  Temp: 97.5 F (36.4 C)  Resp: 16    Neurovascular intact Dorsiflexion/Plantar flexion intact Incision: dressing C/D/I No cellulitis present Compartment soft  LABS  Basename 10/26/12 0445  HGB 11.4*  HCT 33.8*  WBC 15.5*  PLT 146*     Basename 10/26/12 0445  NA 135  K 4.3  BUN 12  CREATININE 0.87  GLUCOSE 147*     Assessment/Plan: 1 Day Post-Op Procedure(s) (LRB): TOTAL KNEE ARTHROPLASTY (Right) HV drain d/c'ed Foley cath d/c'ed Advance diet Up with therapy D/C IV fluids Discharge home with home health Follow up in 2 weeks at [facility]. Follow up with OLIN,Nicholl Onstott D in 2 weeks.  Contact information:  [facility] [address] [place] [address] [phone]     Expected ABLA  Treated with iron and will observe  Overweight (BMI 25-29.9) Estimated Body mass index is 28.87 kg/(m^2) as calculated from the following:   Height as of this encounter: 6\' 3" (1.905 m).   Weight as of this encounter: 231 lb(104.781 kg). Patient also counseled that weight may inhibit the healing process Patient counseled that losing weight will help with future health issues       Anastasio Auerbach. Tregan Read   PAC  10/26/2012, 10:56 AM

## 2012-10-26 NOTE — Care Management Note (Signed)
    Page 1 of 2   10/26/2012     5:09:45 PM   CARE MANAGEMENT NOTE 10/26/2012  Patient:  [name], [name]   Account Number:  [id number]  Date Initiated:  10/26/2012  Documentation initiated by:  Colleen Can  Subjective/Objective Assessment:   DX OSTEOARTHRITIS RT KNEE; TOTAL KNEE REPLACEMNT ON DAY OF ADMISSION     Action/Plan:   CM spoke with patient. Plans are for patient to return to his home in [place] where spouse will be caregiver. He already has RW, cane, crutches. He is requesting [facility]   Anticipated DC Date:  10/26/2012   Anticipated DC Plan:  HOME W HOME HEALTH SERVICES  In-house referral  NA      DC Planning Services  CM consult      [facility] Choice  HOME HEALTH   Choice offered to / List presented to:  C-1 Patient   DME arranged  NA      DME agency  NA     HH arranged  HH-2 PT      [facility] agency  [facility] Home Health Services   Status of service:  Completed, signed off Medicare Important Message given?  NA - LOS <3 / Initial given by admissions (If response is "NO", the following Medicare IM given date fields will be blank) Date Medicare IM given:   Date Additional Medicare IM given:    Discharge Disposition:  HOME W HOME HEALTH SERVICES  Per UR Regulation:    If discussed at Long Length of Stay Meetings, dates discussed:    Comments:  10/26/2012 Colleen Can BSN RN CCM [phone] [facility] Spoke with Vickie who advises that they donot have availability of HHpt at this time so will not be able to provide services [facility] contacted-spoke with Maralyn Sago in intake who advised that they would not be able to provide services d/t no availability of HHpt services at this time. [facility] who advises that they donot have availability of services at this time [facility] who advised that they would need to review patient's insurance then call me back. Face sheet faxed to [phone]. Tct [facility] who advised that they donot go  area so cannot provide HH services . [facility] rep-Kristin-they will be able to service patient with HHpt with start date of 10/27/2012. Orders, face sheet, op note and H&P picked up by rep-Kristin. Contact information for [facility] given to patient.-

## 2012-10-30 NOTE — Discharge Summary (Signed)
Physician Discharge Summary  Patient ID: [name] MRN: [id number] DOB/AGE: Jul 13, 1946 66 y.o.  Admit date: 10/25/2012 Discharge date: 10/26/2012   Procedures:  Procedure(s) (LRB): TOTAL KNEE ARTHROPLASTY (Right)  Attending Physician:  Dr. Durene Romans   Admission Diagnoses:   Right knee OA / pain  Discharge Diagnoses:  Principal Problem:  *S/P right TKA Coronary artery disease   Hyperlipidemia   Hypertension   Chronic knee pain   Seasonal allergies   Asthma - AS A CHILD   Shingles   Hernia   GERD (gastroesophageal reflux disease)   Arthritis - OA AND PAIN BOTH KNEES   Sleep apnea   Restless leg syndrome   HPI: [name], 66 y.o. male, has a history of pain and functional disability in the right knee due to arthritis and has failed non-surgical conservative treatments for greater than 12 weeks to includeNSAID's and/or analgesics, corticosteriod injections and activity modification. Onset of symptoms was gradual, starting 2 years ago with gradually worsening course since that time. The patient noted no past surgery on the right knee(s). Patient currently rates pain in the right knee(s) at 8 out of 10 with activity. Patient has worsening of pain with activity and weight bearing, pain that interferes with activities of daily living, crepitus and joint swelling. Patient has evidence of periarticular osteophytes and joint space narrowing by imaging studies. Risks, benefits and expectations were discussed with the patient. Patient understand the risks, benefits and expectations and wishes to proceed with surgery.   PCP: Lorenda Peck, MD   Discharged Condition: good  Hospital Course:  Patient underwent the above stated procedure on 10/25/2012. Patient tolerated the procedure well and brought to the recovery room in good condition and subsequently to the floor.  POD #1 BP: 120/62 ; Pulse: 62 ; Temp: 97.5 F (36.4 C) ; Resp: 16 Pt's foley was removed, as well  as the hemovac drain removed. IV was changed to a saline lock. Patient reports pain as mild, pain well controlled. No events throughout the night. Ready to be discharged home. Neurovascular intact, dorsiflexion/plantar flexion intact, incision: dressing C/D/I, no cellulitis present and compartment soft.   LABS  Basename  10/26/12 0445   HGB  11.4  HCT  33.8    Discharge Exam: General appearance: alert, cooperative and no distress Extremities: Homans sign is negative, no sign of DVT, no edema, redness or tenderness in the calves or thighs and no ulcers, gangrene or trophic changes  Disposition:  Home-Health Care Svc with follow up in 2 weeks   Follow-up Information    Follow up with [name], MD. In 2 weeks.   Contact information:   [address] [place] [address] [phone]          Discharge Orders    Future Orders Please Complete By Expires   Diet - low sodium heart healthy      Call MD / Call 911      Comments:   If you experience chest pain or shortness of breath, CALL 911 and be transported to the hospital emergency room.  If you develope a fever above 101 F, pus (white drainage) or increased drainage or redness at the wound, or calf pain, call your surgeon's office.   Discharge instructions      Comments:   Maintain surgical dressing for 10-14 days, then replace with gauze and tape. Keep the area dry and clean until follow up. Follow up in 2 weeks at [facility].  Call with any questions or concerns.   Constipation Prevention      Comments:   Drink plenty of fluids.  Prune juice may be helpful.  You may use a stool softener, such as Colace (over the counter) 100 mg twice a day.  Use MiraLax (over the counter) for constipation as needed.   Increase activity slowly as tolerated      TED hose      Comments:   Use stockings (TED hose) for 2 weeks on both leg(s).  You may remove them at night for sleeping.   Change dressing       Comments:   Maintain surgical dressing for 10-14 days, then change the dressing daily with sterile 4 x 4 inch gauze dressing and tape. Keep the area dry and clean.      Discharge Medication List as of 10/26/2012 11:41 AM    START taking these medications   Details  docusate sodium 100 MG CAPS Take 100 mg by mouth 2 (two) times daily., Starting 10/26/2012, Until Discontinued, No Print    ferrous sulfate 325 (65 FE) MG tablet Take 1 tablet (325 mg total) by mouth 3 (three) times daily after meals., Starting 10/26/2012, Until Discontinued, No Print    HYDROcodone-acetaminophen (NORCO) 7.5-325 MG per tablet Take 1-2 tablets by mouth every 4 (four) hours as needed for pain., Starting 10/26/2012, Until Discontinued, Print    polyethylene glycol (MIRALAX / GLYCOLAX) packet Take 17 g by mouth daily as needed., Starting 10/26/2012, Until Discontinued, No Print    methocarbamol (ROBAXIN) 500 MG tablet Take 1 tablet (500 mg total) by mouth every 6 (six) hours as needed (muscle spasms)., Starting 10/26/2012, Until Discontinued, No Print      CONTINUE these medications which have NOT CHANGED   Details  aspirin EC 81 MG tablet Take 81 mg by mouth every morning., Until Discontinued, Historical Med    atorvastatin (LIPITOR) 20 MG tablet Take 20 mg by mouth at bedtime. , Until Discontinued, Historical Med    clonazePAM (KLONOPIN) 0.5 MG tablet Take 0.5 mg by mouth at bedtime as needed. For sleep and restlessness, Until Discontinued, Historical Med    diphenhydrAMINE (BENADRYL) 25 MG tablet Take 25 mg by mouth every morning., Until Discontinued, Historical Med    Fluticasone Propionate (FLONASE NA) Place 2 sprays into the nose as needed. For sinus infection, Until Discontinued, Historical Med    metoprolol tartrate (LOPRESSOR) 25 MG tablet Take 25 mg by mouth 2 (two) times daily., Until Discontinued, Historical Med    clopidogrel (PLAVIX) 75 MG tablet Take 75 mg by mouth every morning. , Until  Discontinued, Historical Med    nitroGLYCERIN (NITROSTAT) 0.4 MG SL tablet Place 0.4 mg under the tongue every 5 (five) minutes as needed. For chest pain, Starting 01/12/2012, Until Discontinued, Historical Med      STOP taking these medications     naproxen sodium (ANAPROX) 220 MG tablet Comments:  Reason for Stopping:           Signed: Anastasio Griffin. [name]   PAC  10/30/2012, 5:33 PM

## 2013-01-05 ENCOUNTER — Other Ambulatory Visit: Payer: Self-pay | Admitting: *Deleted

## 2013-01-05 MED ORDER — ATORVASTATIN CALCIUM 20 MG PO TABS: 20.0000 mg | ORAL_TABLET | Freq: Every day | ORAL | Status: DC

## 2013-01-05 NOTE — Telephone Encounter (Signed)
NEED APPOINTMENT Fax Received. Refill Completed. [name] (R.M.A)   

## 2013-01-06 ENCOUNTER — Other Ambulatory Visit: Payer: Self-pay | Admitting: *Deleted

## 2013-01-06 NOTE — Telephone Encounter (Signed)
Opened in Error.

## 2013-02-03 ENCOUNTER — Other Ambulatory Visit: Payer: Self-pay | Admitting: *Deleted

## 2013-02-03 MED ORDER — METOPROLOL TARTRATE 25 MG PO TABS: 25.0000 mg | ORAL_TABLET | Freq: Two times a day (BID) | ORAL | Status: DC

## 2013-02-13 ENCOUNTER — Other Ambulatory Visit: Payer: Self-pay | Admitting: *Deleted

## 2013-02-13 MED ORDER — CLOPIDOGREL BISULFATE 75 MG PO TABS: 75.0000 mg | ORAL_TABLET | Freq: Every morning | ORAL | Status: DC

## 2013-02-13 NOTE — Telephone Encounter (Signed)
Fax Received. Refill Completed. Octavia Chowoe (R.M.A)   

## 2013-02-14 ENCOUNTER — Encounter: Payer: Self-pay | Admitting: Cardiovascular Disease

## 2013-02-17 ENCOUNTER — Other Ambulatory Visit: Payer: Self-pay | Admitting: *Deleted

## 2013-02-17 ENCOUNTER — Ambulatory Visit (INDEPENDENT_AMBULATORY_CARE_PROVIDER_SITE_OTHER): Payer: Medicare Other | Admitting: Cardiovascular Disease

## 2013-02-17 ENCOUNTER — Encounter: Payer: Self-pay | Admitting: Cardiovascular Disease

## 2013-02-17 VITALS — BP 132/86 | HR 59 | Ht 75.0 in | Wt 224.0 lb

## 2013-02-17 DIAGNOSIS — E785 Hyperlipidemia, unspecified: Secondary | ICD-10-CM

## 2013-02-17 DIAGNOSIS — I2510 Atherosclerotic heart disease of native coronary artery without angina pectoris: Secondary | ICD-10-CM

## 2013-02-17 LAB — BASIC METABOLIC PANEL
BUN: 15 mg/dL (ref 6–23)
CO2: 26 mEq/L (ref 19–32)
Calcium: 9 mg/dL (ref 8.4–10.5)
Chloride: 105 mEq/L (ref 96–112)
Creatinine, Ser: 1 mg/dL (ref 0.4–1.5)

## 2013-02-17 LAB — HEPATIC FUNCTION PANEL
AST: 33 U/L (ref 0–37)
Alkaline Phosphatase: 72 U/L (ref 39–117)
Bilirubin, Direct: 0.1 mg/dL (ref 0.0–0.3)

## 2013-02-17 LAB — LIPID PANEL
Cholesterol: 159 mg/dL (ref 0–200)
LDL Cholesterol: 83 mg/dL (ref 0–99)
Total CHOL/HDL Ratio: 3

## 2013-02-17 MED ORDER — NITROGLYCERIN 0.4 MG SL SUBL: 0.4000 mg | SUBLINGUAL_TABLET | SUBLINGUAL | Status: DC | PRN

## 2013-02-17 NOTE — Assessment & Plan Note (Signed)
He is doing well. He has not had any episodes of angina. He has had bilateral knee replacements this past year. He is exercising on a regular basis.  We'll continue with the same medications. We'll check fasting labs today. His EKG looks unchanged. I'll see him again in one year with repeat fasting labs and EKG. He will call us sooner if he has any problems.

## 2013-02-17 NOTE — Progress Notes (Signed)
[name] Date of Birth  March 07, 1946 [facility]     [place] Office  [address]    [address]   [address] [place], [place]  [address]    [place], [place]  [address] [phone]  Fax  [phone]  [phone]  Fax [phone]   Problem list: 1. Coronary artery disease-status post 2.5 x 18 mm Promus stent positioned in the ramus intermediate vessel (10/29/08) 2. Hyperlipidemia 3. RBBB  History of Present Illness:   [name] is a 67 yo with a hx of CAD.  He is exercising regularly.  No angina  February 17, 2013: No angina.  He had bilateral knee replacement.  He is working out 3-4 days a week - 1-1/2 hour each time.     Current Outpatient Prescriptions on File Prior to Visit  Medication Sig Dispense Refill  . aspirin EC 81 MG tablet Take 81 mg by mouth every morning.      Marland Kitchen atorvastatin (LIPITOR) 20 MG tablet Take 1 tablet (20 mg total) by mouth at bedtime.  30 tablet  1  . clonazePAM (KLONOPIN) 0.5 MG tablet Take 0.5 mg by mouth at bedtime as needed. For sleep and restlessness      . clopidogrel (PLAVIX) 75 MG tablet Take 1 tablet (75 mg total) by mouth every morning.  30 tablet  0  . diphenhydrAMINE (BENADRYL) 25 MG tablet Take 25 mg by mouth every morning.      . Fluticasone Propionate (FLONASE NA) Place 2 sprays into the nose as needed. For sinus infection      . metoprolol tartrate (LOPRESSOR) 25 MG tablet Take 1 tablet (25 mg total) by mouth 2 (two) times daily.  60 tablet  5  . nitroGLYCERIN (NITROSTAT) 0.4 MG SL tablet Place 0.4 mg under the tongue every 5 (five) minutes as needed. For chest pain       No current facility-administered medications on file prior to visit.    Allergies  Allergen Reactions  . Clarithromycin Other (See Comments)    Reaction=dizziness    Past Medical History  Diagnosis Date  . Coronary artery disease     PTCA/stenting ramus inter. vess.; 10/2008  . Hyperlipidemia   . Hypertension   . Chronic knee pain   .  Seasonal allergies     FREQENT BRONCHITIS. SINUS INFECTIONS WITH SEASON CHANGES  . Asthma     AS A CHILD  . Shingles DEC 2012    MILD CASE-NO RESIDUAL PROBLEMS  . Hernia     "AT MY BELLY BUTTON" - NO PAIN OR PROBLEMS  . GERD (gastroesophageal reflux disease)     MILD--WOULD TAKE ROLAID IF NEEDED  . Arthritis     OA AND PAIN BOTH KNEES  . Sleep apnea 07/06/12    STOP BANG SCORE OF 4  . Restless leg syndrome     Past Surgical History  Procedure Laterality Date  . Knee surgery      right and left  . Cardiac catheterization      10/30/2008;  PTCA/stent ramus interm. vess.  . Coronary angioplasty    . Total knee arthroplasty  07/26/2012    Procedure: TOTAL KNEE ARTHROPLASTY;  Surgeon: [name], MD;  Location: [facility] ORS;  Service: Orthopedics;  Laterality: Left;  . Total knee arthroplasty  10/25/2012    Procedure: TOTAL KNEE ARTHROPLASTY;  Surgeon: [name], MD;  Location: [facility] ORS;  Service: Orthopedics;  Laterality: Right;    History  Smoking  status  . Never Smoker   Smokeless tobacco  . Former Neurosurgeon    History  Alcohol Use  . Yes    Comment: DAILY - ONE OR TWO BEERS OR WINE    No family history on file.  Reviw of Systems:  Reviewed in the HPI.  All other systems are negative.  Physical Exam: Blood pressure 132/86, pulse 59, height 6\' 3"  (1.905 m), weight 224 lb (101.606 kg). General: Well developed, well nourished, in no acute distress.  Head: Normocephalic, atraumatic, sclera non-icteric, mucus membranes are moist,   Neck: Supple. Negative for carotid bruits. JVD not elevated.  Lungs: Clear bilaterally to auscultation without wheezes, rales, or rhonchi. Breathing is unlabored.  Heart: RRR with S1 S2. No murmurs, rubs, or gallops appreciated.  Abdomen: Soft, non-tender, non-distended with normoactive bowel sounds. No hepatomegaly. No rebound/guarding. No obvious abdominal masses.  Msk:  Strength and tone appear normal for age.  Extremities: No clubbing  or cyanosis. No edema.  Distal pedal pulses are 2+ and equal bilaterally.  Neuro: Alert and oriented X 3. Moves all extremities spontaneously.  Psych:  Responds to questions appropriately with a normal affect.  ECG: Sinus rhythm. He has a right bundle branch block. There are no significant changes from his previous EKGs.  Assessment / Plan:

## 2013-02-17 NOTE — Patient Instructions (Addendum)
Your physician wants you to follow-up in: 1 year with ekg  You will receive a reminder letter in the mail two months in advance. If you don't receive a letter, please call our office to schedule the follow-up appointment.  Your physician recommends that you return for lab work in: today and in 1 year

## 2013-02-22 ENCOUNTER — Telehealth: Payer: Self-pay | Admitting: Cardiovascular Disease

## 2013-02-22 NOTE — Telephone Encounter (Signed)
New Problem: ° ° ° °Patient called in returning your call regarding his latest labs.  Please call back. °

## 2013-02-22 NOTE — Telephone Encounter (Signed)
Patient informed. 

## 2013-03-01 ENCOUNTER — Other Ambulatory Visit: Payer: Self-pay | Admitting: *Deleted

## 2013-03-01 MED ORDER — ATORVASTATIN CALCIUM 20 MG PO TABS: 20.0000 mg | ORAL_TABLET | Freq: Every day | ORAL | Status: DC

## 2013-03-01 NOTE — Telephone Encounter (Signed)
Fax Received. Refill Completed. [name] (R.M.A)   

## 2013-04-04 ENCOUNTER — Other Ambulatory Visit: Payer: Self-pay | Admitting: Internal Medicine

## 2013-04-04 DIAGNOSIS — R1310 Dysphagia, unspecified: Secondary | ICD-10-CM

## 2013-04-07 ENCOUNTER — Ambulatory Visit
Admission: RE | Admit: 2013-04-07 | Discharge: 2013-04-07 | Disposition: A | Discharge status: Home / Self Care | Payer: Medicare Other | Source: Ambulatory Visit | Attending: Internal Medicine | Admitting: Internal Medicine

## 2013-04-07 DIAGNOSIS — R1310 Dysphagia, unspecified: Secondary | ICD-10-CM

## 2013-04-26 ENCOUNTER — Other Ambulatory Visit: Payer: Self-pay | Admitting: *Deleted

## 2013-04-26 MED ORDER — CLOPIDOGREL BISULFATE 75 MG PO TABS: 75.0000 mg | ORAL_TABLET | Freq: Every morning | ORAL | Status: DC

## 2013-04-26 NOTE — Telephone Encounter (Signed)
Fax Received. Refill Completed. [name] (R.M.A)   

## 2013-08-08 ENCOUNTER — Other Ambulatory Visit: Payer: Self-pay | Admitting: *Deleted

## 2013-08-08 MED ORDER — METOPROLOL TARTRATE 25 MG PO TABS: 25.0000 mg | ORAL_TABLET | Freq: Two times a day (BID) | ORAL | Status: DC

## 2013-09-18 ENCOUNTER — Other Ambulatory Visit: Payer: Self-pay | Admitting: *Deleted

## 2013-09-18 MED ORDER — ATORVASTATIN CALCIUM 20 MG PO TABS: 20.0000 mg | ORAL_TABLET | Freq: Every day | ORAL | Status: DC

## 2013-11-09 ENCOUNTER — Other Ambulatory Visit: Payer: Self-pay

## 2013-11-09 MED ORDER — CLOPIDOGREL BISULFATE 75 MG PO TABS: 75.0000 mg | ORAL_TABLET | Freq: Every morning | ORAL | Status: DC

## 2013-12-05 ENCOUNTER — Other Ambulatory Visit: Payer: Self-pay

## 2013-12-05 MED ORDER — METOPROLOL TARTRATE 25 MG PO TABS: 25.0000 mg | ORAL_TABLET | Freq: Two times a day (BID) | ORAL | Status: DC

## 2014-01-05 IMAGING — CR DG CHEST 2V
2 series · 2 of 2 positions shown · non-contrast
Comparison: 10/27/2008

CLINICAL DATA: Preop for left knee arthroplasty

CHEST - 2 VIEW

[w chest pa]
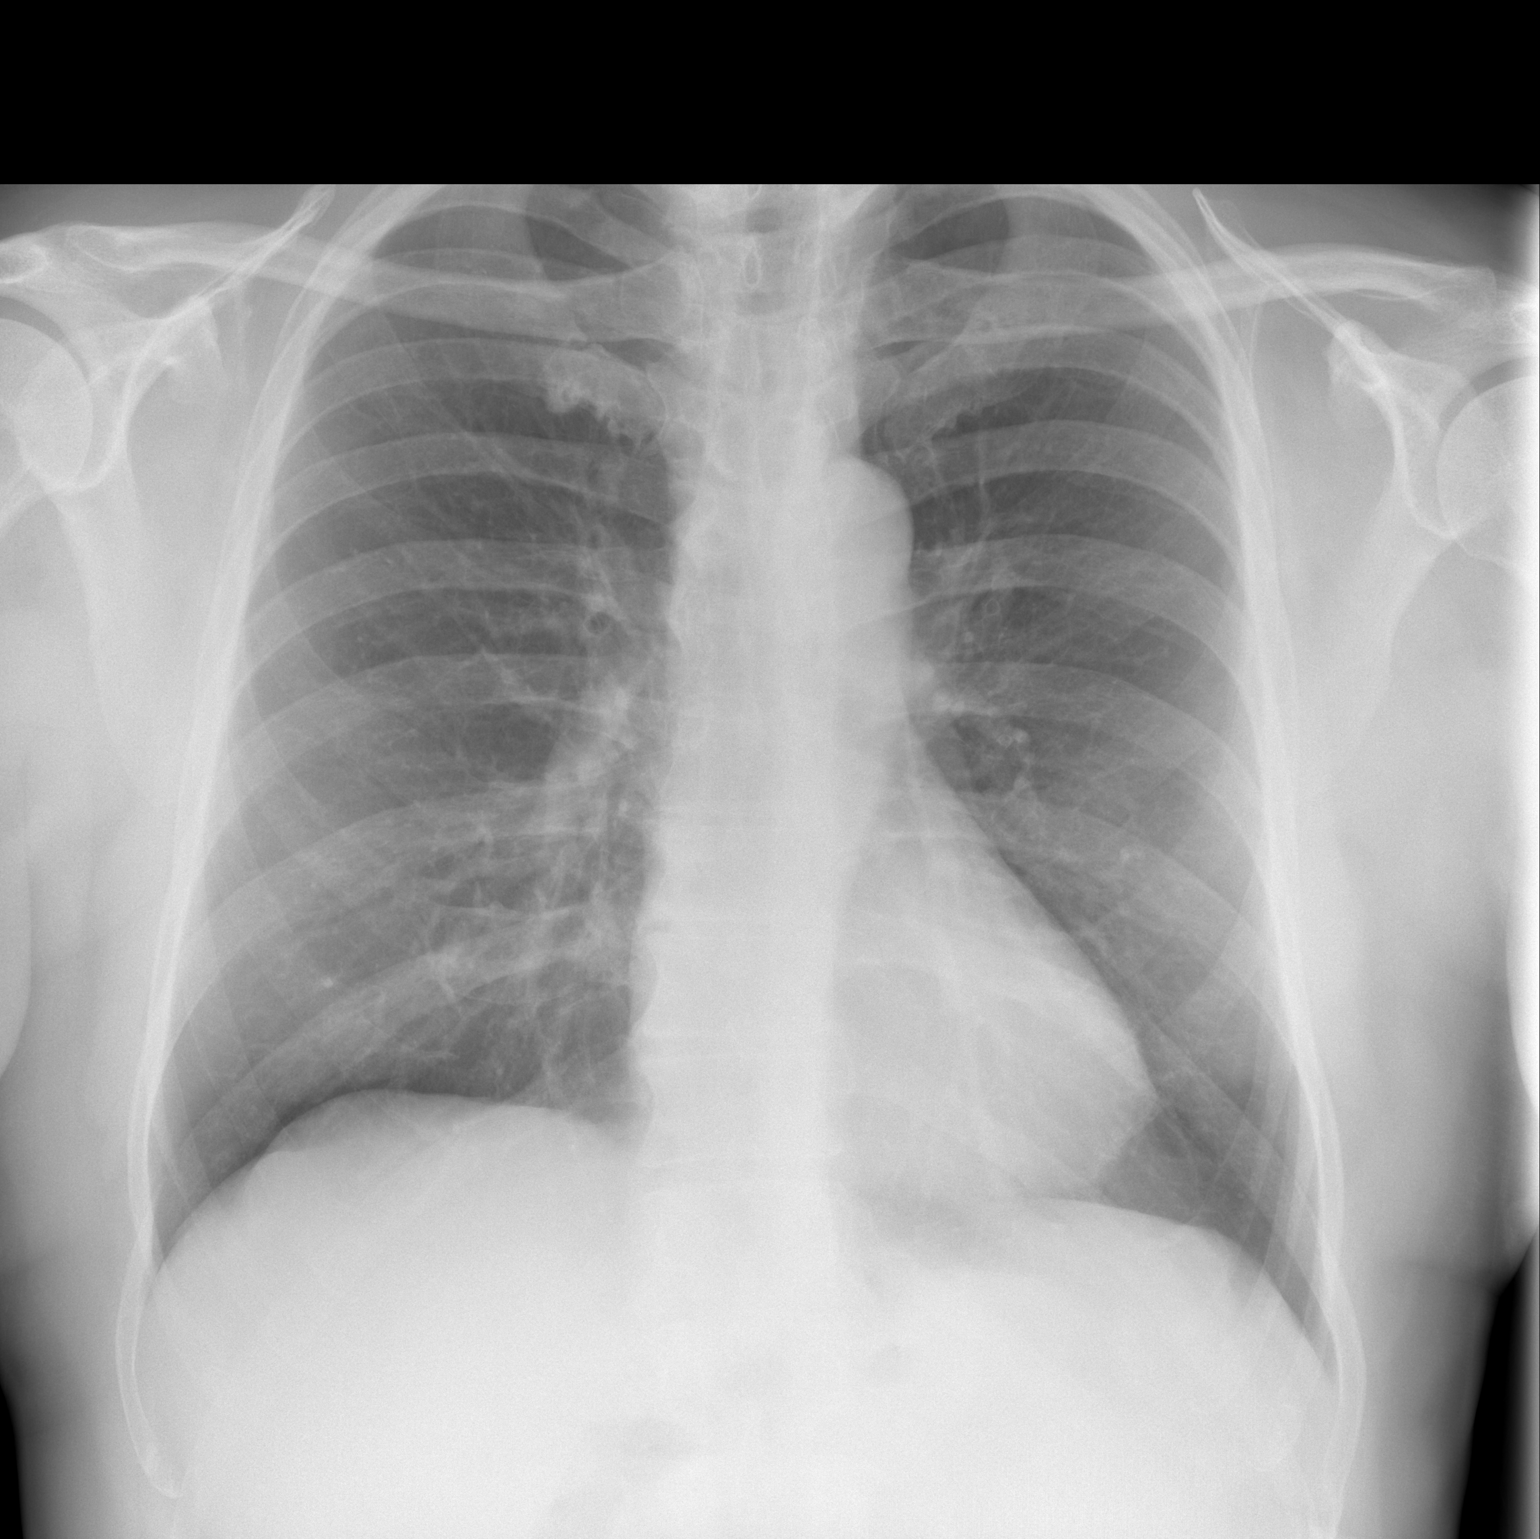

[w chest lat]
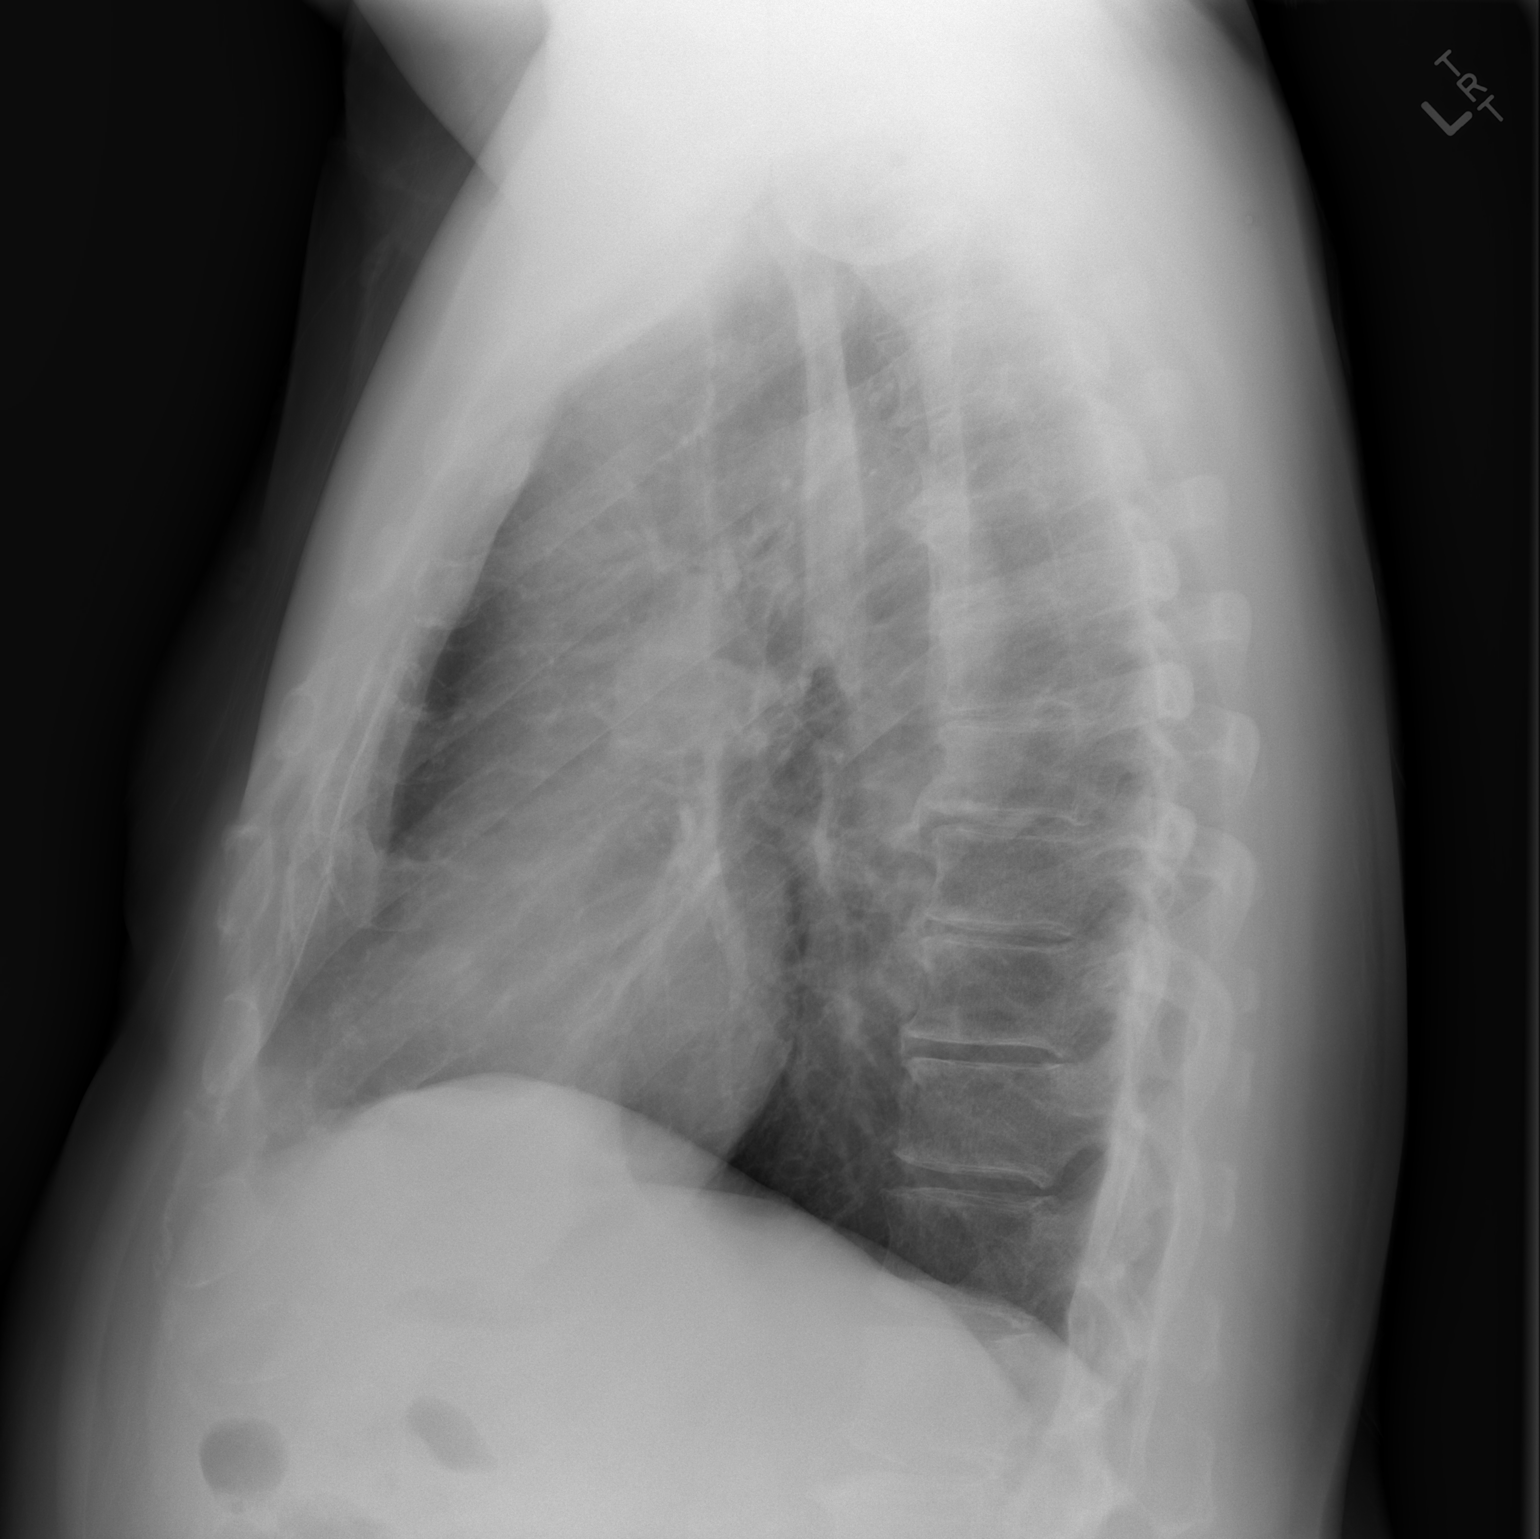

[2 of 2 positions shown; findings below may reference images not displayed]

FINDINGS: Cardiomediastinal silhouette is stable.  No acute
infiltrate or pleural effusion.  No pulmonary edema.  Stable mild
degenerative changes thoracic spine.
IMPRESSION: No active disease.  No significant change.

## 2014-02-21 ENCOUNTER — Encounter: Payer: Self-pay | Admitting: Cardiovascular Disease

## 2014-02-21 ENCOUNTER — Ambulatory Visit (INDEPENDENT_AMBULATORY_CARE_PROVIDER_SITE_OTHER): Payer: Medicare HMO | Admitting: Cardiovascular Disease

## 2014-02-21 VITALS — BP 148/90 | HR 69 | Ht 75.0 in | Wt 231.4 lb

## 2014-02-21 DIAGNOSIS — E785 Hyperlipidemia, unspecified: Secondary | ICD-10-CM

## 2014-02-21 DIAGNOSIS — I2510 Atherosclerotic heart disease of native coronary artery without angina pectoris: Secondary | ICD-10-CM

## 2014-02-21 MED ORDER — NITROGLYCERIN 0.4 MG SL SUBL: 0.4000 mg | SUBLINGUAL_TABLET | SUBLINGUAL | Status: DC | PRN

## 2014-02-21 MED ORDER — ATORVASTATIN CALCIUM 20 MG PO TABS: 20.0000 mg | ORAL_TABLET | Freq: Every day | ORAL | Status: DC

## 2014-02-21 MED ORDER — CLOPIDOGREL BISULFATE 75 MG PO TABS: 75.0000 mg | ORAL_TABLET | Freq: Every morning | ORAL | Status: DC

## 2014-02-21 MED ORDER — METOPROLOL TARTRATE 25 MG PO TABS: 25.0000 mg | ORAL_TABLET | Freq: Two times a day (BID) | ORAL | Status: DC

## 2014-02-21 NOTE — Patient Instructions (Signed)
Your physician recommends that you return for a FASTING lipid profile: THIS Friday/ NO FOOD TILL LAB DRAW, [place]  Your physician wants you to follow-up in: [place] will receive a reminder letter in the mail two months in advance. If you don't receive a letter, please call our office to schedule the follow-up appointment.  Your physician recommends that you return for a FASTING lipid profile: 1 YEAR

## 2014-02-21 NOTE — Progress Notes (Signed)
[name] Date of Birth  03/16/1946 [place] [address]    [place]   [place], [place]  [address]    [place], [place]  [address] [phone]  Fax  [phone]  [phone]  Fax [phone]   Problem list: 1. Coronary artery disease-status post 2.5 x 18 mm Promus stent positioned in the ramus intermediate vessel (10/29/08) 2. Hyperlipidemia 3. RBBB  History of Present Illness:   [name] is a 68 yo with a hx of CAD.  He is exercising regularly.  No angina  February 17, 2013: No angina.  He had bilateral knee replacement.  He is working out 3-4 days a week - 1-1/2 hour each time.     February 21, 2014:  Riding on the stationary bike regularly.  Also boxing ( bag).  No angina.  He had pneumonia this past winter - total of 4 weeks of abx.   Current Outpatient Prescriptions on File Prior to Visit  Medication Sig Dispense Refill  . aspirin EC 81 MG tablet Take 81 mg by mouth every morning.      Marland Kitchen atorvastatin (LIPITOR) 20 MG tablet Take 1 tablet (20 mg total) by mouth at bedtime.  30 tablet  4  . clopidogrel (PLAVIX) 75 MG tablet Take 1 tablet (75 mg total) by mouth every morning.  30 tablet  5  . diphenhydrAMINE (BENADRYL) 25 MG tablet Take 25 mg by mouth every morning.      . Fluticasone Propionate (FLONASE NA) Place 2 sprays into the nose as needed. For sinus infection      . metoprolol tartrate (LOPRESSOR) 25 MG tablet Take 1 tablet (25 mg total) by mouth 2 (two) times daily.  60 tablet  3  . nitroGLYCERIN (NITROSTAT) 0.4 MG SL tablet Place 1 tablet (0.4 mg total) under the tongue every 5 (five) minutes as needed. For chest pain  25 tablet  11   No current facility-administered medications on file prior to visit.    Allergies  Allergen Reactions  . Clarithromycin Other (See Comments)    Reaction=dizziness    Past Medical History  Diagnosis Date  . Coronary artery disease     PTCA/stenting ramus inter. vess.;  10/2008  . Hyperlipidemia   . Hypertension   . Chronic knee pain   . Seasonal allergies     FREQENT BRONCHITIS. SINUS INFECTIONS WITH SEASON CHANGES  . Asthma     AS A CHILD  . Shingles DEC 2012    MILD CASE-NO RESIDUAL PROBLEMS  . Hernia     "AT MY BELLY BUTTON" - NO PAIN OR PROBLEMS  . GERD (gastroesophageal reflux disease)     MILD--WOULD TAKE ROLAID IF NEEDED  . Arthritis     OA AND PAIN BOTH KNEES  . Sleep apnea 07/06/12    STOP BANG SCORE OF 4  . Restless leg syndrome     Past Surgical History  Procedure Laterality Date  . Knee surgery      right and left  . Cardiac catheterization      10/30/2008;  PTCA/stent ramus interm. vess.  . Coronary angioplasty    . Total knee arthroplasty  07/26/2012    Procedure: TOTAL KNEE ARTHROPLASTY;  Surgeon: Mauri Pole, MD;  Location: [facility];  Service: Orthopedics;  Laterality: Left;  . Total knee arthroplasty  10/25/2012    Procedure: TOTAL KNEE ARTHROPLASTY;  Surgeon: Mauri Pole, MD;  Location:  [facility];  Service: Orthopedics;  Laterality: Right;    History  Smoking status  . Never Smoker   Smokeless tobacco  . Former Systems developer    History  Alcohol Use  . Yes    Comment: DAILY - ONE OR TWO BEERS OR WINE    No family history on file.  Reviw of Systems:  Reviewed in the HPI.  All other systems are negative.  Physical Exam: Blood pressure 148/90, pulse 69, height 6\' 3"  (1.905 m), weight 231 lb 6.4 oz (104.962 kg). General: Well developed, well nourished, in no acute distress.  Head: Normocephalic, atraumatic, sclera non-icteric, mucus membranes are moist,   Neck: Supple. Negative for carotid bruits. JVD not elevated.  Lungs: Clear bilaterally to auscultation without wheezes, rales, or rhonchi. Breathing is unlabored.  Heart: RRR with S1 S2. No murmurs, rubs, or gallops appreciated.  Abdomen: Soft, non-tender, non-distended with normoactive bowel sounds. No hepatomegaly. No rebound/guarding. No obvious abdominal  masses.  Msk:  Strength and tone appear normal for age.  Extremities: No clubbing or cyanosis. No edema.  Distal pedal pulses are 2+ and equal bilaterally.  Neuro: Alert and oriented X 3. Moves all extremities spontaneously.  Psych:  Responds to questions appropriately with a normal affect.  ECG: February 21, 2014   Sinus rhythm at 69. He has a right bundle branch block. There are no significant changes from his previous EKGs.  Assessment / Plan:

## 2014-02-21 NOTE — Assessment & Plan Note (Signed)
[name] is doing great. He's not having any episodes of angina. We'll continue with the same medications.  Fasting labs will  be done on Friday.  I will see him  again in one year.

## 2014-02-23 ENCOUNTER — Other Ambulatory Visit (INDEPENDENT_AMBULATORY_CARE_PROVIDER_SITE_OTHER): Payer: Medicare HMO

## 2014-02-23 DIAGNOSIS — I2510 Atherosclerotic heart disease of native coronary artery without angina pectoris: Secondary | ICD-10-CM

## 2014-02-23 DIAGNOSIS — E785 Hyperlipidemia, unspecified: Secondary | ICD-10-CM

## 2014-02-23 LAB — HEPATIC FUNCTION PANEL
ALBUMIN: 4.3 g/dL (ref 3.5–5.2)
ALT: 31 U/L (ref 0–53)
AST: 29 U/L (ref 0–37)
Alkaline Phosphatase: 53 U/L (ref 39–117)
Bilirubin, Direct: 0.2 mg/dL (ref 0.0–0.3)
TOTAL PROTEIN: 6.7 g/dL (ref 6.0–8.3)
Total Bilirubin: 1.1 mg/dL (ref 0.3–1.2)

## 2014-02-23 LAB — BASIC METABOLIC PANEL
BUN: 14 mg/dL (ref 6–23)
CALCIUM: 9.5 mg/dL (ref 8.4–10.5)
CO2: 30 mEq/L (ref 19–32)
Chloride: 104 mEq/L (ref 96–112)
Creatinine, Ser: 1 mg/dL (ref 0.4–1.5)
GFR: 76.02 mL/min (ref >60.00)
GLUCOSE: 108 mg/dL — AB (ref 70–99)
POTASSIUM: 4.8 meq/L (ref 3.5–5.1)
SODIUM: 140 meq/L (ref 135–145)

## 2014-02-23 LAB — LIPID PANEL
CHOLESTEROL: 179 mg/dL (ref 0–200)
HDL: 56.6 mg/dL (ref >39.00)
LDL Cholesterol: 99 mg/dL (ref 0–99)
TRIGLYCERIDES: 117 mg/dL (ref 0.0–149.0)
Total CHOL/HDL Ratio: 3
VLDL: 23.4 mg/dL (ref 0.0–40.0)

## 2014-02-26 ENCOUNTER — Telehealth: Payer: Self-pay | Admitting: *Deleted

## 2014-02-26 DIAGNOSIS — I2510 Atherosclerotic heart disease of native coronary artery without angina pectoris: Secondary | ICD-10-CM

## 2014-02-26 DIAGNOSIS — E785 Hyperlipidemia, unspecified: Secondary | ICD-10-CM

## 2014-02-26 MED ORDER — ATORVASTATIN CALCIUM 40 MG PO TABS: 40.0000 mg | ORAL_TABLET | Freq: Every day | ORAL | Status: DC

## 2014-02-26 NOTE — Telephone Encounter (Signed)
Message copied by Jonathon Jordan on Mon Feb 26, 2014 10:36 AM ------      Message from: [place], Wonda Cheng      Created: Fri Feb 23, 2014  5:03 PM       LDL is 99. His goal is 70.      Increase atorvastatin to 40, check lipids, liver, bmp in 3 mnths       ------

## 2014-02-26 NOTE — Telephone Encounter (Signed)
Medication increased/ 3 mo lab date given, pt verbalized understanding.

## 2014-05-28 ENCOUNTER — Telehealth: Payer: Self-pay | Admitting: Cardiovascular Disease

## 2014-05-28 NOTE — Telephone Encounter (Signed)
Spoke with patient and advised him that lab work is needed to check how he is tolerating increased dosage of Atorvastatin and that lab work from April is too old.  Patient verbalized understanding and agreement and plans to come in tomorrow as scheduled for lab work.  Patient verbalized understanding and agreement to fast for this appointment.

## 2014-05-28 NOTE — Telephone Encounter (Signed)
New Message  Pt called states that his cholesterol medications were changed per Dr. Acie Fredrickson and that he had complete his lab work completed on  04/25 with Dr. Mancel Bale his family physician during his complete physical..Pt requests that our office retrieve those labs results if possible . Please call back to discuss.   Dr. Mancel Bale office # [phone], and please ask for nurse BJ

## 2014-05-29 ENCOUNTER — Other Ambulatory Visit (INDEPENDENT_AMBULATORY_CARE_PROVIDER_SITE_OTHER): Payer: Medicare HMO

## 2014-05-29 DIAGNOSIS — I2510 Atherosclerotic heart disease of native coronary artery without angina pectoris: Secondary | ICD-10-CM

## 2014-05-29 DIAGNOSIS — E785 Hyperlipidemia, unspecified: Secondary | ICD-10-CM

## 2014-05-29 LAB — HEPATIC FUNCTION PANEL
ALBUMIN: 4.4 g/dL (ref 3.5–5.2)
ALK PHOS: 53 U/L (ref 39–117)
ALT: 31 U/L (ref 0–53)
AST: 30 U/L (ref 0–37)
Bilirubin, Direct: 0.2 mg/dL (ref 0.0–0.3)
Total Bilirubin: 1 mg/dL (ref 0.2–1.2)
Total Protein: 6.6 g/dL (ref 6.0–8.3)

## 2014-05-29 LAB — LIPID PANEL
CHOLESTEROL: 145 mg/dL (ref 0–200)
HDL: 55.6 mg/dL (ref >39.00)
LDL Cholesterol: 60 mg/dL (ref 0–99)
NONHDL: 89.4
Total CHOL/HDL Ratio: 3
Triglycerides: 145 mg/dL (ref 0.0–149.0)
VLDL: 29 mg/dL (ref 0.0–40.0)

## 2014-05-29 LAB — BASIC METABOLIC PANEL
BUN: 16 mg/dL (ref 6–23)
CALCIUM: 9.3 mg/dL (ref 8.4–10.5)
CO2: 29 mEq/L (ref 19–32)
Chloride: 105 mEq/L (ref 96–112)
Creatinine, Ser: 1 mg/dL (ref 0.4–1.5)
GFR: 81.41 mL/min (ref >60.00)
Glucose, Bld: 120 mg/dL — ABNORMAL HIGH (ref 70–99)
POTASSIUM: 4.4 meq/L (ref 3.5–5.1)
SODIUM: 139 meq/L (ref 135–145)

## 2014-05-30 ENCOUNTER — Telehealth: Payer: Self-pay | Admitting: Cardiovascular Disease

## 2014-05-30 NOTE — Telephone Encounter (Signed)
Lab results reviewed with patient who verbalized understanding.  Patient states he will continue medication, diet, and exercise.  I advised patient that triglyceride level has increased some just to make patient aware of this trend.  Patient states he knows this comes from carbohydrates and will continue to monitor his intake.

## 2014-05-30 NOTE — Telephone Encounter (Signed)
New problem ° ° °Pt returning your call. °

## 2015-02-12 ENCOUNTER — Other Ambulatory Visit: Payer: Self-pay | Admitting: Cardiovascular Disease

## 2015-02-21 ENCOUNTER — Encounter: Payer: Self-pay | Admitting: Cardiovascular Disease

## 2015-02-21 ENCOUNTER — Ambulatory Visit (INDEPENDENT_AMBULATORY_CARE_PROVIDER_SITE_OTHER): Payer: PPO | Admitting: Cardiovascular Disease

## 2015-02-21 VITALS — BP 138/80 | HR 60 | Ht 75.0 in | Wt 227.6 lb

## 2015-02-21 DIAGNOSIS — I2510 Atherosclerotic heart disease of native coronary artery without angina pectoris: Secondary | ICD-10-CM | POA: Diagnosis not present

## 2015-02-21 DIAGNOSIS — E785 Hyperlipidemia, unspecified: Secondary | ICD-10-CM | POA: Diagnosis not present

## 2015-02-21 NOTE — Progress Notes (Signed)
Cardiology Office Note   Date:  02/21/2015   ID:  [name], DOB Jul 23, 1946, MRN [id number]  PCP:  Myriam Jacobson, MD  Cardiologist:  Thayer Headings, MD   Chief Complaint  Patient presents with  . Coronary Artery Disease   Problem list: 1. Coronary artery disease-status post 2.5 x 18 mm Promus stent positioned in the ramus intermediate vessel (10/29/08) 2. Hyperlipidemia 3. RBBB  History of Present Illness:  [name] is a 69 yo with a hx of CAD. He is exercising regularly. No angina  February 17, 2013: No angina. He had bilateral knee replacement. He is working out 3-4 days a week - 1-1/2 hour each time.   February 21, 2014:  Riding on the stationary bike regularly. Also boxing ( bag).  No angina. He had pneumonia this past winter - total of 4 weeks of abx.   February 21, 2015:   [name]  is a 69 y.o. male who presents for his CAD Is working out regulary Still working part time.      Past Medical History  Diagnosis Date  . Coronary artery disease     PTCA/stenting ramus inter. vess.; 10/2008  . Hyperlipidemia   . Hypertension   . Chronic knee pain   . Seasonal allergies     FREQENT BRONCHITIS. SINUS INFECTIONS WITH SEASON CHANGES  . Asthma     AS A CHILD  . Shingles DEC 2012    MILD CASE-NO RESIDUAL PROBLEMS  . Hernia     "AT MY BELLY BUTTON" - NO PAIN OR PROBLEMS  . GERD (gastroesophageal reflux disease)     MILD--WOULD TAKE ROLAID IF NEEDED  . Arthritis     OA AND PAIN BOTH KNEES  . Sleep apnea 07/06/12    STOP BANG SCORE OF 4  . Restless leg syndrome     Past Surgical History  Procedure Laterality Date  . Knee surgery      right and left  . Cardiac catheterization      10/30/2008;  PTCA/stent ramus interm. vess.  . Coronary angioplasty    . Total knee arthroplasty  07/26/2012    Procedure: TOTAL KNEE ARTHROPLASTY;  Surgeon: Mauri Pole, MD;  Location: [facility];  Service: Orthopedics;  Laterality: Left;  . Total knee arthroplasty   10/25/2012    Procedure: TOTAL KNEE ARTHROPLASTY;  Surgeon: Mauri Pole, MD;  Location: [facility];  Service: Orthopedics;  Laterality: Right;     Current Outpatient Prescriptions  Medication Sig Dispense Refill  . aspirin EC 81 MG tablet Take 81 mg by mouth every morning.    Marland Kitchen atorvastatin (LIPITOR) 40 MG tablet TAKE 1 TABLET (40 MG TOTAL) BY MOUTH AT BEDTIME. 90 tablet 4  . clopidogrel (PLAVIX) 75 MG tablet Take 1 tablet (75 mg total) by mouth every morning. 90 tablet 3  . diphenhydrAMINE (BENADRYL) 25 MG tablet Take 25 mg by mouth every morning.    . Fluticasone Propionate (FLONASE NA) Place 2 sprays into the nose as needed. For sinus infection    . metoprolol tartrate (LOPRESSOR) 25 MG tablet Take 1 tablet (25 mg total) by mouth 2 (two) times daily. 180 tablet 3  . nitroGLYCERIN (NITROSTAT) 0.4 MG SL tablet Place 1 tablet (0.4 mg total) under the tongue every 5 (five) minutes as needed. For chest pain 25 tablet 11  . omeprazole (PRILOSEC) 20 MG capsule Take 20 mg by mouth daily.      No current facility-administered medications for this visit.  Allergies:   Clarithromycin    Social History:  The patient  reports that he has never smoked. He has quit using smokeless tobacco. He reports that he drinks alcohol. He reports that he does not use illicit drugs.   Family History:  The patient's family history includes COPD in his father.    ROS:  Please see the history of present illness.    Review of Systems: Constitutional:  denies fever, chills, diaphoresis, appetite change and fatigue.  HEENT: denies photophobia, eye pain, redness, hearing loss, ear pain, congestion, sore throat, rhinorrhea, sneezing, neck pain, neck stiffness and tinnitus.  Respiratory: denies SOB, DOE, cough, chest tightness, and wheezing.  Cardiovascular: denies chest pain, palpitations and leg swelling.  Gastrointestinal: denies nausea, vomiting, abdominal pain, diarrhea, constipation, blood in stool.    Genitourinary: denies dysuria, urgency, frequency, hematuria, flank pain and difficulty urinating.  Musculoskeletal: denies  myalgias, back pain, joint swelling, arthralgias and gait problem.   Skin: denies pallor, rash and wound.  Neurological: denies dizziness, seizures, syncope, weakness, light-headedness, numbness and headaches.   Hematological: denies adenopathy, easy bruising, personal or family bleeding history.  Psychiatric/ Behavioral: denies suicidal ideation, mood changes, confusion, nervousness, sleep disturbance and agitation.       All other systems are reviewed and negative.    PHYSICAL EXAM: VS:  BP 138/80 mmHg  Pulse 60  Ht 6\' 3"  (1.905 m)  Wt 227 lb 9.6 oz (103.239 kg)  BMI 28.45 kg/m2 , BMI Body mass index is 28.45 kg/(m^2). GEN: Well nourished, well developed, in no acute distress HEENT: normal Neck: no JVD, carotid bruits, or masses Cardiac: RRR; no murmurs, rubs, or gallops,no edema  Respiratory:  clear to auscultation bilaterally, normal work of breathing GI: soft, nontender, nondistended, + BS MS: no deformity or atrophy Skin: warm and dry, no rash Neuro:  Strength and sensation are intact Psych: normal   EKG:  EKG is ordered today. The ekg ordered today demonstrates NSR at 60.  RBBB.  No change    Recent Labs: 05/29/2014: ALT 31; BUN 16; Creatinine 1.0; Potassium 4.4; Sodium 139    Lipid Panel    Component Value Date/Time   CHOL 145 05/29/2014 0825   TRIG 145.0 05/29/2014 0825   HDL 55.60 05/29/2014 0825   CHOLHDL 3 05/29/2014 0825   VLDL 29.0 05/29/2014 0825   LDLCALC 60 05/29/2014 0825      Wt Readings from Last 3 Encounters:  02/21/15 227 lb 9.6 oz (103.239 kg)  02/21/14 231 lb 6.4 oz (104.962 kg)  02/17/13 224 lb (101.606 kg)      Other studies Reviewed: Additional studies/ records that were reviewed today include: . Review of the above records demonstrates:   ASSESSMENT AND PLAN:  1. Coronary artery disease-status post  2.5 x 18 mm Promus stent positioned in the ramus intermediate vessel (10/29/08)  is not having any episodes of chest pain. He's able to do is normal activities. He remains busy  2. Hyperlipidemia - check his fasting lipids today.  Continue current medications.  3. RBBB - stable.   Current medicines are reviewed at length with the patient today.  The patient does not have concerns regarding medicines.  The following changes have been made:  no change  Labs/ tests ordered today include:  No orders of the defined types were placed in this encounter.     Disposition:   FU with me in 1 year    Signed, Miranda Frese, Wonda Cheng, MD  02/21/2015 4:02 PM  [facility] [place], [place], [place]  [address] Phone: [phone]; Fax: [phone]

## 2015-02-21 NOTE — Patient Instructions (Signed)
Your physician recommends that you continue on your current medications as directed. Please refer to the Current Medication list given to you today.  Your physician recommends that you have lab work:  TODAY - cholesterol, liver, basic metabolic panel  Your physician wants you to follow-up in: 1 year with Dr. Acie Fredrickson.  You will receive a reminder letter in the mail two months in advance. If you don't receive a letter, please call our office to schedule the follow-up appointment.

## 2015-02-22 LAB — HEPATIC FUNCTION PANEL
ALT: 36 U/L (ref 0–53)
AST: 34 U/L (ref 0–37)
Albumin: 4.6 g/dL (ref 3.5–5.2)
Alkaline Phosphatase: 61 U/L (ref 39–117)
Bilirubin, Direct: 0.2 mg/dL (ref 0.0–0.3)
TOTAL PROTEIN: 6.9 g/dL (ref 6.0–8.3)
Total Bilirubin: 0.8 mg/dL (ref 0.2–1.2)

## 2015-02-22 LAB — BASIC METABOLIC PANEL
BUN: 20 mg/dL (ref 6–23)
CO2: 30 meq/L (ref 19–32)
CREATININE: 1.13 mg/dL (ref 0.40–1.50)
Calcium: 9.3 mg/dL (ref 8.4–10.5)
Chloride: 104 mEq/L (ref 96–112)
GFR: 68.12 mL/min (ref >60.00)
GLUCOSE: 94 mg/dL (ref 70–99)
Potassium: 4.1 mEq/L (ref 3.5–5.1)
Sodium: 139 mEq/L (ref 135–145)

## 2015-02-22 LAB — LIPID PANEL
CHOL/HDL RATIO: 3
Cholesterol: 152 mg/dL (ref 0–200)
HDL: 54.5 mg/dL (ref >39.00)
LDL Cholesterol: 72 mg/dL (ref 0–99)
NonHDL: 97.5
Triglycerides: 128 mg/dL (ref 0.0–149.0)
VLDL: 25.6 mg/dL (ref 0.0–40.0)

## 2015-02-22 NOTE — Addendum Note (Signed)
Addended by: Eulis Foster on: 02/22/2015 10:30 AM   Modules accepted: Orders

## 2015-04-03 ENCOUNTER — Other Ambulatory Visit: Payer: Self-pay | Admitting: Cardiovascular Disease

## 2015-06-03 ENCOUNTER — Other Ambulatory Visit: Payer: Self-pay | Admitting: Cardiovascular Disease

## 2015-12-18 DIAGNOSIS — H40013 Open angle with borderline findings, low risk, bilateral: Secondary | ICD-10-CM | POA: Diagnosis not present

## 2015-12-18 DIAGNOSIS — H11153 Pinguecula, bilateral: Secondary | ICD-10-CM | POA: Diagnosis not present

## 2016-01-15 DIAGNOSIS — H6502 Acute serous otitis media, left ear: Secondary | ICD-10-CM | POA: Diagnosis not present

## 2016-01-15 DIAGNOSIS — J209 Acute bronchitis, unspecified: Secondary | ICD-10-CM | POA: Diagnosis not present

## 2016-02-27 ENCOUNTER — Ambulatory Visit (INDEPENDENT_AMBULATORY_CARE_PROVIDER_SITE_OTHER): Payer: PPO | Admitting: Cardiovascular Disease

## 2016-02-27 ENCOUNTER — Encounter: Payer: Self-pay | Admitting: Cardiovascular Disease

## 2016-02-27 VITALS — BP 128/88 | HR 74 | Ht 75.0 in | Wt 222.0 lb

## 2016-02-27 DIAGNOSIS — I2510 Atherosclerotic heart disease of native coronary artery without angina pectoris: Secondary | ICD-10-CM | POA: Diagnosis not present

## 2016-02-27 DIAGNOSIS — E785 Hyperlipidemia, unspecified: Secondary | ICD-10-CM | POA: Diagnosis not present

## 2016-02-27 LAB — COMPREHENSIVE METABOLIC PANEL
ALBUMIN: 4 g/dL (ref 3.6–5.1)
ALT: 34 U/L (ref 9–46)
AST: 30 U/L (ref 10–35)
Alkaline Phosphatase: 52 U/L (ref 40–115)
BILIRUBIN TOTAL: 0.7 mg/dL (ref 0.2–1.2)
BUN: 15 mg/dL (ref 7–25)
CO2: 25 mmol/L (ref 20–31)
CREATININE: 0.97 mg/dL (ref 0.70–1.18)
Calcium: 9.3 mg/dL (ref 8.6–10.3)
Chloride: 105 mmol/L (ref 98–110)
Glucose, Bld: 97 mg/dL (ref 65–99)
Potassium: 4.2 mmol/L (ref 3.5–5.3)
SODIUM: 138 mmol/L (ref 135–146)
Total Protein: 6.4 g/dL (ref 6.1–8.1)

## 2016-02-27 LAB — LIPID PANEL
Cholesterol: 141 mg/dL (ref 125–200)
HDL: 56 mg/dL (ref >=40)
LDL Cholesterol: 56 mg/dL (ref <130)
Total CHOL/HDL Ratio: 2.5 Ratio (ref <=5.0)
Triglycerides: 144 mg/dL (ref <150)
VLDL: 29 mg/dL (ref <30)

## 2016-02-27 NOTE — Patient Instructions (Signed)
Medication Instructions:  Your physician recommends that you continue on your current medications as directed. Please refer to the Current Medication list given to you today.   Labwork: TODAY - cholesterol, complete metabolic panel   Testing/Procedures: None Ordered   Follow-Up: Your physician wants you to follow-up in: 1 year with Dr. Nahser.  You will receive a reminder letter in the mail two months in advance. If you don't receive a letter, please call our office to schedule the follow-up appointment.   If you need a refill on your cardiac medications before your next appointment, please call your pharmacy.   Thank you for choosing [facility]! Varnell Orvis, RN [phone]    

## 2016-02-27 NOTE — Progress Notes (Signed)
Cardiology Office Note   Date:  02/27/2016   ID:  [name], DOB 1946/02/20, MRN [id number]  PCP:  Myriam Jacobson, MD  Cardiologist:  Thayer Headings, MD   Chief Complaint  Patient presents with  . Follow-up  . Coronary Artery Disease   Problem list: 1. Coronary artery disease-status post 2.5 x 18 mm Promus stent positioned in the ramus intermediate vessel (10/29/08) 2. Hyperlipidemia 3. RBBB  History of Present Illness:  [name] is a 70 yo with a hx of CAD. He is exercising regularly. No angina  February 17, 2013: No angina. He had bilateral knee replacement. He is working out 3-4 days a week - 1-1/2 hour each time.   February 21, 2014:  Riding on the stationary bike regularly. Also boxing ( bag).  No angina. He had pneumonia this past winter - total of 4 weeks of abx.   February 21, 2015:   [name]  is a 70 y.o. male who presents for his CAD Is working out regulary Still working part time.    February 27, 2016:  [name] is doing well. Boxing with the speed bag and the heavy bad Stationary bike, sit ups .  Weights.  No CP , no dyspnea.    Past Medical History  Diagnosis Date  . Coronary artery disease     PTCA/stenting ramus inter. vess.; 10/2008  . Hyperlipidemia   . Hypertension   . Chronic knee pain   . Seasonal allergies     FREQENT BRONCHITIS. SINUS INFECTIONS WITH SEASON CHANGES  . Asthma     AS A CHILD  . Shingles DEC 2012    MILD CASE-NO RESIDUAL PROBLEMS  . Hernia     "AT MY BELLY BUTTON" - NO PAIN OR PROBLEMS  . GERD (gastroesophageal reflux disease)     MILD--WOULD TAKE ROLAID IF NEEDED  . Arthritis     OA AND PAIN BOTH KNEES  . Sleep apnea 07/06/12    STOP BANG SCORE OF 4  . Restless leg syndrome     Past Surgical History  Procedure Laterality Date  . Knee surgery      right and left  . Cardiac catheterization      10/30/2008;  PTCA/stent ramus interm. vess.  . Coronary angioplasty    . Total knee arthroplasty  07/26/2012      Procedure: TOTAL KNEE ARTHROPLASTY;  Surgeon: Mauri Pole, MD;  Location: [facility];  Service: Orthopedics;  Laterality: Left;  . Total knee arthroplasty  10/25/2012    Procedure: TOTAL KNEE ARTHROPLASTY;  Surgeon: Mauri Pole, MD;  Location: [facility];  Service: Orthopedics;  Laterality: Right;     Current Outpatient Prescriptions  Medication Sig Dispense Refill  . aspirin EC 81 MG tablet Take 81 mg by mouth every morning.    Marland Kitchen atorvastatin (LIPITOR) 40 MG tablet TAKE 1 TABLET (40 MG TOTAL) BY MOUTH AT BEDTIME. 90 tablet 4  . clopidogrel (PLAVIX) 75 MG tablet TAKE 1 TABLET (75 MG TOTAL) BY MOUTH EVERY MORNING. 90 tablet 3  . diphenhydrAMINE (BENADRYL) 25 MG tablet Take 25 mg by mouth every morning.    . Fluticasone Propionate (FLONASE NA) Place 2 sprays into the nose as needed. For sinus infection    . metoprolol tartrate (LOPRESSOR) 25 MG tablet TAKE 1 TABLET (25 MG TOTAL) BY MOUTH 2 (TWO) TIMES DAILY. 180 tablet 3  . nitroGLYCERIN (NITROSTAT) 0.4 MG SL tablet Place 1 tablet (0.4 mg total) under the tongue every 5 (  five) minutes as needed. For chest pain 25 tablet 11   No current facility-administered medications for this visit.    Allergies:   Clarithromycin    Social History:  The patient  reports that he has never smoked. He has quit using smokeless tobacco. He reports that he drinks alcohol. He reports that he does not use illicit drugs.   Family History:  The patient's family history includes COPD in his father.    ROS:  Please see the history of present illness.    Review of Systems: Constitutional:  denies fever, chills, diaphoresis, appetite change and fatigue.  HEENT: denies photophobia, eye pain, redness, hearing loss, ear pain, congestion, sore throat, rhinorrhea, sneezing, neck pain, neck stiffness and tinnitus.  Respiratory: denies SOB, DOE, cough, chest tightness, and wheezing.  Cardiovascular: denies chest pain, palpitations and leg swelling.  Gastrointestinal:  denies nausea, vomiting, abdominal pain, diarrhea, constipation, blood in stool.  Genitourinary: denies dysuria, urgency, frequency, hematuria, flank pain and difficulty urinating.  Musculoskeletal: denies  myalgias, back pain, joint swelling, arthralgias and gait problem.   Skin: denies pallor, rash and wound.  Neurological: denies dizziness, seizures, syncope, weakness, light-headedness, numbness and headaches.   Hematological: denies adenopathy, easy bruising, personal or family bleeding history.  Psychiatric/ Behavioral: denies suicidal ideation, mood changes, confusion, nervousness, sleep disturbance and agitation.       All other systems are reviewed and negative.    PHYSICAL EXAM: VS:  BP 128/88 mmHg  Pulse 74  Ht 6\' 3"  (1.905 m)  Wt 222 lb (100.699 kg)  BMI 27.75 kg/m2 , BMI Body mass index is 27.75 kg/(m^2). GEN: Well nourished, well developed, in no acute distress HEENT: normal Neck: no JVD, carotid bruits, or masses Cardiac: RRR; no murmurs, rubs, or gallops,no edema  Respiratory:  clear to auscultation bilaterally, normal work of breathing GI: soft, nontender, nondistended, + BS MS: no deformity or atrophy Skin: warm and dry, no rash Neuro:  Strength and sensation are intact Psych: normal   EKG:  EKG is ordered today. The ekg ordered today demonstrates sinus brady at 56 .    RBBB.  No change    Recent Labs: No results found for requested labs within last 365 days.    Lipid Panel    Component Value Date/Time   CHOL 152 02/22/2015 1031   TRIG 128.0 02/22/2015 1031   HDL 54.50 02/22/2015 1031   CHOLHDL 3 02/22/2015 1031   VLDL 25.6 02/22/2015 1031   LDLCALC 72 02/22/2015 1031      Wt Readings from Last 3 Encounters:  02/27/16 222 lb (100.699 kg)  02/21/15 227 lb 9.6 oz (103.239 kg)  02/21/14 231 lb 6.4 oz (104.962 kg)      Other studies Reviewed: Additional studies/ records that were reviewed today include: . Review of the above records  demonstrates:   ASSESSMENT AND PLAN:  1. Coronary artery disease-status post 2.5 x 18 mm Promus stent positioned in the ramus intermediate vessel (10/29/08)  is not having any episodes of chest pain. He's able to do is normal activities. He remains busy. Working part time   2. Hyperlipidemia - check his fasting lipids today.  Continue current medications.  3. RBBB - stable.   Current medicines are reviewed at length with the patient today.  The patient does not have concerns regarding medicines.  The following changes have been made:  no change  Labs/ tests ordered today include:  No orders of the defined types were placed in this encounter.  Disposition:   FU with me in 1 year    Signed, Nahser, Wonda Cheng, MD  02/27/2016 11:42 AM    [facility] [place], [place], [place]  [address] Phone: [phone]; Fax: [phone]

## 2016-03-26 ENCOUNTER — Other Ambulatory Visit: Payer: Self-pay | Admitting: Cardiovascular Disease

## 2016-04-08 DIAGNOSIS — Z1389 Encounter for screening for other disorder: Secondary | ICD-10-CM | POA: Diagnosis not present

## 2016-04-08 DIAGNOSIS — H9113 Presbycusis, bilateral: Secondary | ICD-10-CM | POA: Diagnosis not present

## 2016-04-08 DIAGNOSIS — R1032 Left lower quadrant pain: Secondary | ICD-10-CM | POA: Diagnosis not present

## 2016-04-08 DIAGNOSIS — E7800 Pure hypercholesterolemia, unspecified: Secondary | ICD-10-CM | POA: Diagnosis not present

## 2016-04-08 DIAGNOSIS — Z1211 Encounter for screening for malignant neoplasm of colon: Secondary | ICD-10-CM | POA: Diagnosis not present

## 2016-04-08 DIAGNOSIS — Z7289 Other problems related to lifestyle: Secondary | ICD-10-CM | POA: Diagnosis not present

## 2016-04-08 DIAGNOSIS — I70219 Atherosclerosis of native arteries of extremities with intermittent claudication, unspecified extremity: Secondary | ICD-10-CM | POA: Diagnosis not present

## 2016-04-08 DIAGNOSIS — R002 Palpitations: Secondary | ICD-10-CM | POA: Diagnosis not present

## 2016-04-08 DIAGNOSIS — Z131 Encounter for screening for diabetes mellitus: Secondary | ICD-10-CM | POA: Diagnosis not present

## 2016-04-08 DIAGNOSIS — Z0000 Encounter for general adult medical examination without abnormal findings: Secondary | ICD-10-CM | POA: Diagnosis not present

## 2016-04-08 DIAGNOSIS — R42 Dizziness and giddiness: Secondary | ICD-10-CM | POA: Diagnosis not present

## 2016-04-08 DIAGNOSIS — R5383 Other fatigue: Secondary | ICD-10-CM | POA: Diagnosis not present

## 2016-04-08 DIAGNOSIS — Z79899 Other long term (current) drug therapy: Secondary | ICD-10-CM | POA: Diagnosis not present

## 2016-04-08 DIAGNOSIS — I10 Essential (primary) hypertension: Secondary | ICD-10-CM | POA: Diagnosis not present

## 2016-04-08 DIAGNOSIS — Z125 Encounter for screening for malignant neoplasm of prostate: Secondary | ICD-10-CM | POA: Diagnosis not present

## 2016-04-30 ENCOUNTER — Other Ambulatory Visit: Payer: Self-pay | Admitting: Cardiovascular Disease

## 2016-05-17 ENCOUNTER — Other Ambulatory Visit: Payer: Self-pay | Admitting: Cardiovascular Disease

## 2016-06-06 ENCOUNTER — Emergency Department (HOSPITAL_COMMUNITY): Payer: PPO

## 2016-06-06 ENCOUNTER — Encounter (HOSPITAL_COMMUNITY): Payer: Self-pay

## 2016-06-06 ENCOUNTER — Emergency Department (HOSPITAL_COMMUNITY)
Admission: EM | Admit: 2016-06-06 | Discharge: 2016-06-06 | Disposition: A | Discharge status: Home / Self Care | Payer: PPO | Attending: Emergency Medicine | Admitting: Emergency Medicine

## 2016-06-06 DIAGNOSIS — Z951 Presence of aortocoronary bypass graft: Secondary | ICD-10-CM | POA: Insufficient documentation

## 2016-06-06 DIAGNOSIS — M25532 Pain in left wrist: Secondary | ICD-10-CM | POA: Diagnosis not present

## 2016-06-06 DIAGNOSIS — E785 Hyperlipidemia, unspecified: Secondary | ICD-10-CM | POA: Diagnosis not present

## 2016-06-06 DIAGNOSIS — M7989 Other specified soft tissue disorders: Secondary | ICD-10-CM | POA: Diagnosis not present

## 2016-06-06 DIAGNOSIS — Z96659 Presence of unspecified artificial knee joint: Secondary | ICD-10-CM | POA: Insufficient documentation

## 2016-06-06 DIAGNOSIS — J45909 Unspecified asthma, uncomplicated: Secondary | ICD-10-CM | POA: Diagnosis not present

## 2016-06-06 NOTE — ED Provider Notes (Signed)
CSN: [id number]     Arrival date & time 06/06/16  [id number] History   First MD Initiated Contact with Patient 06/06/16 1001     Chief Complaint  Patient presents with  . Wrist Pain    HPI Comments: 70 year old male presents with acute onset of left wrist pain. Past medical history significant for CAD, hyperlipidemia, hypertension, bilateral knee replacements, arthritis. Patient states he was playing golf yesterday and after he swung the club he had an acute onset of hand pain which shot up into his elbow. Reports associated swelling. Denies weakness, numbness, tingling, redness. He has appointment with his orthopedic doctor on Monday.Patient reports he is very active; he lifts weights, plays golf, rides his bike. He has an appointment with his Orthopedic doctor on Monday.  Patient is a 70 y.o. male presenting with wrist pain.  Wrist Pain Associated symptoms include arthralgias and joint swelling. Pertinent negatives include no fever or numbness.    Past Medical History  Diagnosis Date  . Coronary artery disease     PTCA/stenting ramus inter. vess.; 10/2008  . Hyperlipidemia   . Hypertension   . Chronic knee pain   . Seasonal allergies     FREQENT BRONCHITIS. SINUS INFECTIONS WITH SEASON CHANGES  . Asthma     AS A CHILD  . Shingles DEC 2012    MILD CASE-NO RESIDUAL PROBLEMS  . Hernia     "AT MY BELLY BUTTON" - NO PAIN OR PROBLEMS  . GERD (gastroesophageal reflux disease)     MILD--WOULD TAKE ROLAID IF NEEDED  . Arthritis     OA AND PAIN BOTH KNEES  . Sleep apnea 07/06/12    STOP BANG SCORE OF 4  . Restless leg syndrome    Past Surgical History  Procedure Laterality Date  . Knee surgery      right and left  . Cardiac catheterization      10/30/2008;  PTCA/stent ramus interm. vess.  . Coronary angioplasty    . Total knee arthroplasty  07/26/2012    Procedure: TOTAL KNEE ARTHROPLASTY;  Surgeon: Mauri Pole, MD;  Location: [facility];  Service: Orthopedics;  Laterality: Left;  . Total  knee arthroplasty  10/25/2012    Procedure: TOTAL KNEE ARTHROPLASTY;  Surgeon: Mauri Pole, MD;  Location: [facility];  Service: Orthopedics;  Laterality: Right;  . Joint replacement     Family History  Problem Relation Age of Onset  . COPD Father    Social History  Substance Use Topics  . Smoking status: Never Smoker   . Smokeless tobacco: Former Systems developer  . Alcohol Use: Yes     Comment: occ    Review of Systems  Constitutional: Negative for fever.  Musculoskeletal: Positive for joint swelling and arthralgias.  Neurological: Negative for numbness.  All other systems reviewed and are negative.     Allergies  Clarithromycin  Home Medications   Prior to Admission medications   Medication Sig Start Date End Date Taking? Authorizing Provider  aspirin EC 81 MG tablet Take 81 mg by mouth every morning.    Historical Provider, MD  atorvastatin (LIPITOR) 40 MG tablet TAKE 1 TABLET (40 MG TOTAL) BY MOUTH AT BEDTIME. 04/30/16   Thayer Headings, MD  clopidogrel (PLAVIX) 75 MG tablet TAKE 1 TABLET (75 MG TOTAL) BY MOUTH EVERY MORNING. 05/18/16   Thayer Headings, MD  diphenhydrAMINE (BENADRYL) 25 MG tablet Take 25 mg by mouth every morning.    Historical Provider, MD  Fluticasone Propionate (FLONASE NA)  Place 2 sprays into the nose as needed. For sinus infection    Historical Provider, MD  metoprolol tartrate (LOPRESSOR) 25 MG tablet TAKE 1 TABLET BY MOUTH TWICE A DAY 03/26/16   Thayer Headings, MD  nitroGLYCERIN (NITROSTAT) 0.4 MG SL tablet Place 1 tablet (0.4 mg total) under the tongue every 5 (five) minutes as needed. For chest pain 02/21/14   Thayer Headings, MD   BP 171/93 mmHg  Pulse 60  Temp(Src) 97.7 F (36.5 C) (Oral)  Resp 16  SpO2 100%   Physical Exam  Constitutional: He is oriented to person, place, and time. He appears well-developed and well-nourished. No distress.  HENT:  Head: Normocephalic and atraumatic.  Eyes: Conjunctivae are normal. Pupils are equal, round, and  reactive to light. Right eye exhibits no discharge. Left eye exhibits no discharge. No scleral icterus.  Neck: Normal range of motion.  Cardiovascular: Normal rate.   Pulmonary/Chest: Effort normal. No respiratory distress.  Abdominal: Soft. He exhibits no distension.  Musculoskeletal:  Left hand: Moderate swelling. No deformity. Mild tenderness to palpation of CMC joint. FROM. N/V intact.    Neurological: He is alert and oriented to person, place, and time.  Skin: Skin is warm and dry.  Psychiatric: He has a normal mood and affect.    ED Course  Procedures (including critical care time) Labs Review Labs Reviewed - No data to display  Imaging Review Dg Wrist Complete Left  06/06/2016  CLINICAL DATA:  Left wrist pain and swelling following playing golf, no known injury, initial encounter EXAM: LEFT WRIST - COMPLETE 3+ VIEW COMPARISON:  None. FINDINGS: Degenerative changes of the first [facility] joint are seen. No acute fracture or dislocation is noted. Vascular calcifications are noted. IMPRESSION: Degenerative change without acute abnormality. Electronically Signed   By: Inez Catalina M.D.   On: 06/06/2016 10:34   I have personally reviewed and evaluated these images and lab results as part of my medical decision-making.   EKG Interpretation None      MDM   Final diagnoses:  Left wrist pain   70 year old male who presents with acute onset of hand pain and swelling. Unclear etiology as he denies direct trauma to hand. He is in a minimal amount of pain, hast full ROM of wrist, and is N/V intact. No signs of infection. Xray negative for fracture. Shared visit with Dr. Tyrone Nine. Recommend ibuprofen and ice. Patient is NAD, non-toxic, with stable VS. Patient is informed of clinical course, understands medical decision making process, and agrees with plan. Opportunity for questions provided and all questions answered. Return precautions given. Pt will follow up with Ortho.   Recardo Evangelist,  PA-C 06/06/16 [place], DO 06/06/16 1439

## 2016-06-06 NOTE — Discharge Instructions (Signed)

## 2016-06-06 NOTE — ED Notes (Signed)
Pt c/o L wrist pain x 1 day.  Pain score 4/10.  Pt reports he was hitting golf balls when he felt a sharp pain shoot from his hand to his elbow.  Sts pain only remains in wrist.  Swelling noted.  Pt is followed by [name].

## 2016-06-11 DIAGNOSIS — S6982XA Other specified injuries of left wrist, hand and finger(s), initial encounter: Secondary | ICD-10-CM | POA: Diagnosis not present

## 2016-06-11 DIAGNOSIS — M25532 Pain in left wrist: Secondary | ICD-10-CM | POA: Diagnosis not present

## 2016-06-20 DIAGNOSIS — S6992XA Unspecified injury of left wrist, hand and finger(s), initial encounter: Secondary | ICD-10-CM | POA: Diagnosis not present

## 2016-06-23 DIAGNOSIS — S66812D Strain of other specified muscles, fascia and tendons at wrist and hand level, left hand, subsequent encounter: Secondary | ICD-10-CM | POA: Diagnosis not present

## 2016-07-22 DIAGNOSIS — Y999 Unspecified external cause status: Secondary | ICD-10-CM | POA: Diagnosis not present

## 2016-07-22 DIAGNOSIS — M86642 Other chronic osteomyelitis, left hand: Secondary | ICD-10-CM | POA: Diagnosis not present

## 2016-07-22 DIAGNOSIS — S56402A Unspecified injury of extensor muscle, fascia and tendon of left index finger at forearm level, initial encounter: Secondary | ICD-10-CM | POA: Diagnosis not present

## 2016-07-22 DIAGNOSIS — S66892A Other injury of other specified muscles, fascia and tendons at wrist and hand level, left hand, initial encounter: Secondary | ICD-10-CM | POA: Diagnosis not present

## 2016-07-22 DIAGNOSIS — M25732 Osteophyte, left wrist: Secondary | ICD-10-CM | POA: Diagnosis not present

## 2016-08-04 DIAGNOSIS — Z4789 Encounter for other orthopedic aftercare: Secondary | ICD-10-CM | POA: Diagnosis not present

## 2016-08-04 DIAGNOSIS — S66812D Strain of other specified muscles, fascia and tendons at wrist and hand level, left hand, subsequent encounter: Secondary | ICD-10-CM | POA: Diagnosis not present

## 2016-08-14 DIAGNOSIS — M25632 Stiffness of left wrist, not elsewhere classified: Secondary | ICD-10-CM | POA: Diagnosis not present

## 2016-08-28 DIAGNOSIS — M25632 Stiffness of left wrist, not elsewhere classified: Secondary | ICD-10-CM | POA: Diagnosis not present

## 2016-09-04 DIAGNOSIS — M25632 Stiffness of left wrist, not elsewhere classified: Secondary | ICD-10-CM | POA: Diagnosis not present

## 2016-09-11 DIAGNOSIS — M25632 Stiffness of left wrist, not elsewhere classified: Secondary | ICD-10-CM | POA: Diagnosis not present

## 2016-09-15 DIAGNOSIS — L03312 Cellulitis of back [any part except buttock]: Secondary | ICD-10-CM | POA: Diagnosis not present

## 2016-09-15 DIAGNOSIS — I10 Essential (primary) hypertension: Secondary | ICD-10-CM | POA: Diagnosis not present

## 2016-09-15 DIAGNOSIS — Z23 Encounter for immunization: Secondary | ICD-10-CM | POA: Diagnosis not present

## 2016-09-18 DIAGNOSIS — M25632 Stiffness of left wrist, not elsewhere classified: Secondary | ICD-10-CM | POA: Diagnosis not present

## 2016-10-09 DIAGNOSIS — M25632 Stiffness of left wrist, not elsewhere classified: Secondary | ICD-10-CM | POA: Diagnosis not present

## 2016-10-28 DIAGNOSIS — B356 Tinea cruris: Secondary | ICD-10-CM | POA: Diagnosis not present

## 2016-12-02 DIAGNOSIS — J209 Acute bronchitis, unspecified: Secondary | ICD-10-CM | POA: Diagnosis not present

## 2016-12-09 DIAGNOSIS — Z96653 Presence of artificial knee joint, bilateral: Secondary | ICD-10-CM | POA: Diagnosis not present

## 2016-12-09 DIAGNOSIS — Z471 Aftercare following joint replacement surgery: Secondary | ICD-10-CM | POA: Diagnosis not present

## 2016-12-31 DIAGNOSIS — H40013 Open angle with borderline findings, low risk, bilateral: Secondary | ICD-10-CM | POA: Diagnosis not present

## 2016-12-31 DIAGNOSIS — H11153 Pinguecula, bilateral: Secondary | ICD-10-CM | POA: Diagnosis not present

## 2017-01-27 ENCOUNTER — Telehealth: Payer: Self-pay | Admitting: Cardiovascular Disease

## 2017-01-27 DIAGNOSIS — E785 Hyperlipidemia, unspecified: Secondary | ICD-10-CM

## 2017-01-27 MED ORDER — NITROGLYCERIN 0.4 MG SL SUBL: 0.4000 mg | SUBLINGUAL_TABLET | SUBLINGUAL | 1 refills | Status: DC | PRN

## 2017-01-27 NOTE — Telephone Encounter (Signed)
New message     *STAT* If patient is at the pharmacy, call can be transferred to refill team.   1. Which medications need to be refilled? (please list name of each medication and dose if known) nitrostat 0.4mg    2. Which pharmacy/location (including street and city if local pharmacy) is medication to be sent to? [facility] on [address]  3. Do they need a 30 day or 90 day supply? 30 day    Pt states his pills have expired, and he needs more called into the pharmacy.

## 2017-01-31 ENCOUNTER — Other Ambulatory Visit: Payer: Self-pay | Admitting: Cardiovascular Disease

## 2017-02-11 ENCOUNTER — Other Ambulatory Visit: Payer: Self-pay | Admitting: Cardiovascular Disease

## 2017-02-26 ENCOUNTER — Encounter: Payer: Self-pay | Admitting: Cardiovascular Disease

## 2017-02-26 ENCOUNTER — Ambulatory Visit (INDEPENDENT_AMBULATORY_CARE_PROVIDER_SITE_OTHER): Payer: PPO | Admitting: Cardiovascular Disease

## 2017-02-26 VITALS — BP 148/98 | HR 58 | Ht 75.0 in | Wt 224.0 lb

## 2017-02-26 DIAGNOSIS — I2510 Atherosclerotic heart disease of native coronary artery without angina pectoris: Secondary | ICD-10-CM

## 2017-02-26 DIAGNOSIS — E782 Mixed hyperlipidemia: Secondary | ICD-10-CM | POA: Diagnosis not present

## 2017-02-26 LAB — LIPID PANEL
Chol/HDL Ratio: 2.8 ratio units (ref 0.0–5.0)
Cholesterol, Total: 157 mg/dL (ref 100–199)
HDL: 57 mg/dL (ref >39)
LDL Calculated: 57 mg/dL (ref 0–99)
Triglycerides: 216 mg/dL — ABNORMAL HIGH (ref 0–149)
VLDL Cholesterol Cal: 43 mg/dL — ABNORMAL HIGH (ref 5–40)

## 2017-02-26 LAB — COMPREHENSIVE METABOLIC PANEL
ALBUMIN: 4.3 g/dL (ref 3.5–4.8)
ALK PHOS: 65 IU/L (ref 39–117)
ALT: 41 IU/L (ref 0–44)
AST: 43 IU/L — ABNORMAL HIGH (ref 0–40)
Albumin/Globulin Ratio: 2 (ref 1.2–2.2)
BUN / CREAT RATIO: 13 (ref 10–24)
BUN: 12 mg/dL (ref 8–27)
Bilirubin Total: 0.9 mg/dL (ref 0.0–1.2)
CALCIUM: 9.4 mg/dL (ref 8.6–10.2)
CO2: 23 mmol/L (ref 18–29)
CREATININE: 0.92 mg/dL (ref 0.76–1.27)
Chloride: 100 mmol/L (ref 96–106)
GFR calc Af Amer: 96 mL/min/{1.73_m2} (ref >59)
GFR, EST NON AFRICAN AMERICAN: 83 mL/min/{1.73_m2} (ref >59)
Globulin, Total: 2.2 g/dL (ref 1.5–4.5)
Glucose: 118 mg/dL — ABNORMAL HIGH (ref 65–99)
Potassium: 4.3 mmol/L (ref 3.5–5.2)
Sodium: 138 mmol/L (ref 134–144)
TOTAL PROTEIN: 6.5 g/dL (ref 6.0–8.5)

## 2017-02-26 NOTE — Progress Notes (Signed)
Cardiology Office Note   Date:  02/26/2017   ID:  [name], DOB 28-Oct-1946, MRN [id number]  PCP:  Myriam Jacobson, MD  Cardiologist:  Mertie Moores, MD   Chief Complaint  Patient presents with  . Coronary Artery Disease   Problem list: 1. Coronary artery disease-status post 2.5 x 18 mm Promus stent positioned in the ramus intermediate vessel (10/29/08) 2. Hyperlipidemia 3. RBBB   [name] is a 71 yo with a hx of CAD. He is exercising regularly. No angina  February 17, 2013: No angina. He had bilateral knee replacement. He is working out 3-4 days a week - 1-1/2 hour each time.   February 21, 2014:  Riding on the stationary bike regularly. Also boxing (speed bag).  No angina. He had pneumonia this past winter - total of 4 weeks of abx.   February 21, 2015:   [name]  is a 71 y.o. male who presents for his CAD Is working out regulary Still working part time.    February 27, 2016:  [name] is doing well. Boxing with the speed bag and the heavy bad Stationary bike, sit ups .  Weights.  No CP , no dyspnea.   February 26, 2017  [name] is doing well  BP is a bit elevated here.   Its normal at the [facility] - checks it regularly  No CP , no dyspnea.   Past Medical History:  Diagnosis Date  . Arthritis    OA AND PAIN BOTH KNEES  . Asthma    AS A CHILD  . Chronic knee pain   . Coronary artery disease    PTCA/stenting ramus inter. vess.; 10/2008  . GERD (gastroesophageal reflux disease)    MILD--WOULD TAKE ROLAID IF NEEDED  . Hernia    "AT MY BELLY BUTTON" - NO PAIN OR PROBLEMS  . Hyperlipidemia   . Hypertension   . Restless leg syndrome   . Seasonal allergies    FREQENT BRONCHITIS. SINUS INFECTIONS WITH SEASON CHANGES  . Shingles DEC 2012   MILD CASE-NO RESIDUAL PROBLEMS  . Sleep apnea 07/06/12   STOP BANG SCORE OF 4    Past Surgical History:  Procedure Laterality Date  . CARDIAC CATHETERIZATION     10/30/2008;  PTCA/stent ramus interm. vess.  . CORONARY  ANGIOPLASTY    . JOINT REPLACEMENT    . KNEE SURGERY     right and left  . TOTAL KNEE ARTHROPLASTY  07/26/2012   Procedure: TOTAL KNEE ARTHROPLASTY;  Surgeon: Mauri Pole, MD;  Location: [facility];  Service: Orthopedics;  Laterality: Left;  . TOTAL KNEE ARTHROPLASTY  10/25/2012   Procedure: TOTAL KNEE ARTHROPLASTY;  Surgeon: Mauri Pole, MD;  Location: [facility];  Service: Orthopedics;  Laterality: Right;     Current Outpatient Prescriptions  Medication Sig Dispense Refill  . aspirin EC 81 MG tablet Take 81 mg by mouth every morning.    Marland Kitchen atorvastatin (LIPITOR) 40 MG tablet TAKE 1 TABLET (40 MG TOTAL) BY MOUTH AT BEDTIME. 90 tablet 0  . clopidogrel (PLAVIX) 75 MG tablet TAKE 1 TABLET (75 MG TOTAL) BY MOUTH EVERY MORNING. 90 tablet 0  . diphenhydrAMINE (BENADRYL) 25 MG tablet Take 25 mg by mouth every morning.    . Fluticasone Propionate (FLONASE NA) Place 2 sprays into the nose as needed. For sinus infection    . metoprolol tartrate (LOPRESSOR) 25 MG tablet TAKE 1 TABLET BY MOUTH TWICE A DAY 180 tablet 3  . nitroGLYCERIN (NITROSTAT) 0.4  MG SL tablet Place 1 tablet (0.4 mg total) under the tongue every 5 (five) minutes as needed. For chest pain 25 tablet 1   No current facility-administered medications for this visit.     Allergies:   Clarithromycin    Social History:  The patient  reports that he has never smoked. He has quit using smokeless tobacco. He reports that he drinks alcohol. He reports that he does not use drugs.   Family History:  The patient's family history includes COPD in his father.    ROS:  Please see the history of present illness.    Review of Systems: Constitutional:  denies fever, chills, diaphoresis, appetite change and fatigue.  HEENT: denies photophobia, eye pain, redness, hearing loss, ear pain, congestion, sore throat, rhinorrhea, sneezing, neck pain, neck stiffness and tinnitus.  Respiratory: denies SOB, DOE, cough, chest tightness, and wheezing.    Cardiovascular: denies chest pain, palpitations and leg swelling.  Gastrointestinal: denies nausea, vomiting, abdominal pain, diarrhea, constipation, blood in stool.  Genitourinary: denies dysuria, urgency, frequency, hematuria, flank pain and difficulty urinating.  Musculoskeletal: denies  myalgias, back pain, joint swelling, arthralgias and gait problem.   Skin: denies pallor, rash and wound.  Neurological: denies dizziness, seizures, syncope, weakness, light-headedness, numbness and headaches.   Hematological: denies adenopathy, easy bruising, personal or family bleeding history.  Psychiatric/ Behavioral: denies suicidal ideation, mood changes, confusion, nervousness, sleep disturbance and agitation.       All other systems are reviewed and negative.    PHYSICAL EXAM: VS:  BP (!) 148/98 (BP Location: Left Arm, Patient Position: Sitting, Cuff Size: Normal)   Pulse (!) 58   Ht 6\' 3"  (1.905 m)   Wt 224 lb (101.6 kg)   SpO2 97%   BMI 28.00 kg/m  , BMI Body mass index is 28 kg/m. GEN: Well nourished, well developed, in no acute distress  HEENT: normal  Neck: no JVD, carotid bruits, or masses Cardiac: RRR; no murmurs, rubs, or gallops,no edema  Respiratory:  clear to auscultation bilaterally, normal work of breathing GI: soft, nontender, nondistended, + BS MS: no deformity or atrophy  Skin: warm and dry, no rash Neuro:  Strength and sensation are intact Psych: normal   EKG:  EKG is ordered today. The ekg ordered today demonstrates sinus brady at 58 .    RBBB.  No change    Recent Labs: 02/27/2016: ALT 34; BUN 15; Creat 0.97; Potassium 4.2; Sodium 138    Lipid Panel    Component Value Date/Time   CHOL 141 02/27/2016 1207   TRIG 144 02/27/2016 1207   HDL 56 02/27/2016 1207   CHOLHDL 2.5 02/27/2016 1207   VLDL 29 02/27/2016 1207   LDLCALC 56 02/27/2016 1207      Wt Readings from Last 3 Encounters:  02/26/17 224 lb (101.6 kg)  02/27/16 222 lb (100.7 kg)  02/21/15  227 lb 9.6 oz (103.2 kg)      Other studies Reviewed: Additional studies/ records that were reviewed today include: . Review of the above records demonstrates:   ASSESSMENT AND PLAN:  1. Coronary artery disease-status post 2.5 x 18 mm Promus stent positioned in the ramus intermediate vessel (10/29/08)  is not having any episodes of chest pain. He's able to do is normal activities. He remains busy. Working part time   2. Hyperlipidemia -  Continue current medications. Tolerating them very well .   Check labs today   3. RBBB - stable.   Current medicines are reviewed at  length with the patient today.  The patient does not have concerns regarding medicines.  The following changes have been made:  no change  Labs/ tests ordered today include:  No orders of the defined types were placed in this encounter.    Disposition:   FU with me in 1 year    Signed, Mertie Moores, MD  02/26/2017 11:43 AM    [facility] [place], [place], [place]  [address] Phone: [phone]; Fax: [phone]

## 2017-02-26 NOTE — Patient Instructions (Signed)
Medication Instructions:  Your physician recommends that you continue on your current medications as directed. Please refer to the Current Medication list given to you today.   Labwork: TODAY - cholesterol, complete metabolic panel   Testing/Procedures: None Ordered   Follow-Up: Your physician wants you to follow-up in: 1 year with Dr. Acie Fredrickson.  You will receive a reminder letter in the mail two months in advance. If you don't receive a letter, please call our office to schedule the follow-up appointment.   If you need a refill on your cardiac medications before your next appointment, please call your pharmacy.   Thank you for choosing [facility]! Christen Bame, RN [phone]

## 2017-03-01 ENCOUNTER — Telehealth: Payer: Self-pay | Admitting: Cardiovascular Disease

## 2017-03-01 NOTE — Telephone Encounter (Signed)
New Message    Please call pt is returning your call about lab results, please call him at [facility] 03/02/17

## 2017-03-02 NOTE — Telephone Encounter (Signed)
Results of lab work reviewed with patient who verbalized understanding and agreement to reduce intake of carbohydrates. He thanked me for the call.

## 2017-03-02 NOTE — Telephone Encounter (Signed)
-----   Message from Thayer Headings, MD sent at 02/26/2017  4:59 PM EDT ----- Katherene Ponto are higher. Needs to work on reducing carbs.  Increase cardio

## 2017-03-22 DIAGNOSIS — J029 Acute pharyngitis, unspecified: Secondary | ICD-10-CM | POA: Diagnosis not present

## 2017-03-22 DIAGNOSIS — J209 Acute bronchitis, unspecified: Secondary | ICD-10-CM | POA: Diagnosis not present

## 2017-03-25 ENCOUNTER — Other Ambulatory Visit: Payer: Self-pay | Admitting: Cardiovascular Disease

## 2017-04-12 DIAGNOSIS — R1032 Left lower quadrant pain: Secondary | ICD-10-CM | POA: Diagnosis not present

## 2017-04-12 DIAGNOSIS — E039 Hypothyroidism, unspecified: Secondary | ICD-10-CM | POA: Diagnosis not present

## 2017-04-12 DIAGNOSIS — H9113 Presbycusis, bilateral: Secondary | ICD-10-CM | POA: Diagnosis not present

## 2017-04-12 DIAGNOSIS — Z23 Encounter for immunization: Secondary | ICD-10-CM | POA: Diagnosis not present

## 2017-04-12 DIAGNOSIS — Z79899 Other long term (current) drug therapy: Secondary | ICD-10-CM | POA: Diagnosis not present

## 2017-04-12 DIAGNOSIS — E7800 Pure hypercholesterolemia, unspecified: Secondary | ICD-10-CM | POA: Diagnosis not present

## 2017-04-12 DIAGNOSIS — Z1211 Encounter for screening for malignant neoplasm of colon: Secondary | ICD-10-CM | POA: Diagnosis not present

## 2017-04-12 DIAGNOSIS — J309 Allergic rhinitis, unspecified: Secondary | ICD-10-CM | POA: Diagnosis not present

## 2017-04-12 DIAGNOSIS — R42 Dizziness and giddiness: Secondary | ICD-10-CM | POA: Diagnosis not present

## 2017-04-12 DIAGNOSIS — I70219 Atherosclerosis of native arteries of extremities with intermittent claudication, unspecified extremity: Secondary | ICD-10-CM | POA: Diagnosis not present

## 2017-04-12 DIAGNOSIS — R002 Palpitations: Secondary | ICD-10-CM | POA: Diagnosis not present

## 2017-04-12 DIAGNOSIS — Z1389 Encounter for screening for other disorder: Secondary | ICD-10-CM | POA: Diagnosis not present

## 2017-04-12 DIAGNOSIS — Z0000 Encounter for general adult medical examination without abnormal findings: Secondary | ICD-10-CM | POA: Diagnosis not present

## 2017-04-12 DIAGNOSIS — E785 Hyperlipidemia, unspecified: Secondary | ICD-10-CM | POA: Diagnosis not present

## 2017-04-12 DIAGNOSIS — R5383 Other fatigue: Secondary | ICD-10-CM | POA: Diagnosis not present

## 2017-05-08 ENCOUNTER — Other Ambulatory Visit: Payer: Self-pay | Admitting: Cardiovascular Disease

## 2017-11-02 ENCOUNTER — Other Ambulatory Visit: Payer: Self-pay

## 2017-11-08 DIAGNOSIS — J209 Acute bronchitis, unspecified: Secondary | ICD-10-CM | POA: Diagnosis not present

## 2017-11-08 DIAGNOSIS — H6502 Acute serous otitis media, left ear: Secondary | ICD-10-CM | POA: Diagnosis not present

## 2017-11-10 ENCOUNTER — Other Ambulatory Visit: Payer: Self-pay | Admitting: *Deleted

## 2017-11-10 MED ORDER — ATORVASTATIN CALCIUM 40 MG PO TABS: ORAL_TABLET | ORAL | 0 refills | Status: DC

## 2017-11-10 MED ORDER — CLOPIDOGREL BISULFATE 75 MG PO TABS: ORAL_TABLET | ORAL | 0 refills | Status: DC

## 2017-11-18 DIAGNOSIS — J029 Acute pharyngitis, unspecified: Secondary | ICD-10-CM | POA: Diagnosis not present

## 2017-11-18 DIAGNOSIS — J209 Acute bronchitis, unspecified: Secondary | ICD-10-CM | POA: Diagnosis not present

## 2017-12-08 DIAGNOSIS — J209 Acute bronchitis, unspecified: Secondary | ICD-10-CM | POA: Diagnosis not present

## 2017-12-08 DIAGNOSIS — J029 Acute pharyngitis, unspecified: Secondary | ICD-10-CM | POA: Diagnosis not present

## 2017-12-09 ENCOUNTER — Ambulatory Visit
Admission: RE | Admit: 2017-12-09 | Discharge: 2017-12-09 | Disposition: A | Discharge status: Home / Self Care | Payer: PPO | Source: Ambulatory Visit | Attending: Internal Medicine | Admitting: Internal Medicine

## 2017-12-09 ENCOUNTER — Other Ambulatory Visit: Payer: Self-pay | Admitting: Internal Medicine

## 2017-12-09 DIAGNOSIS — R059 Cough, unspecified: Secondary | ICD-10-CM

## 2017-12-09 DIAGNOSIS — R05 Cough: Secondary | ICD-10-CM

## 2017-12-24 ENCOUNTER — Other Ambulatory Visit: Payer: Self-pay | Admitting: Cardiovascular Disease

## 2017-12-24 MED ORDER — ATORVASTATIN CALCIUM 40 MG PO TABS: ORAL_TABLET | ORAL | 0 refills | Status: DC

## 2017-12-27 ENCOUNTER — Other Ambulatory Visit: Payer: Self-pay

## 2017-12-27 MED ORDER — ATORVASTATIN CALCIUM 40 MG PO TABS: ORAL_TABLET | ORAL | 1 refills | Status: DC

## 2018-03-01 NOTE — Progress Notes (Signed)
Cardiology Office Note:    Date:  03/02/2018   ID:  [name], DOB July 20, 1946, MRN [id number]  PCP:  Lorene Dy, MD  Cardiologist:  Mertie Moores, MD   Referring MD: Lorene Dy, MD   Chief Complaint  Patient presents with  . Follow-up    CAD    History of Present Illness:    [name] is a 72 y.o. male with coronary artery disease status post drug-eluting stent to the ramus intermedius in 2009.  Stress testing in 2011 was negative for ischemia.  Last seen by Dr. Acie Fredrickson March 2018.  Mr. Rammel returns for annual follow up.  He is here alone.  He is doing well.  He works out several times a week (boxing, treadmill, etc) at [employer].  He also does a lot of yard work.  He denies chest pain, shortness of breath, syncope, orthopnea, PND or significant pedal edema.  His BP is usually high in the clinic.  His BP at the [facility] is always 120/80.  Prior CV studies:   The following studies were reviewed today:  Nuclear stress test 11/06/10 No ischemia, EF 62, normal study  Cardiac catheterization 10/29/08 LAD ostial 30 RI mid 90 LCx irregularities RCA patent EF 65 PCI: 2.5 x 18 mm Promus DES to the RI   Past Medical History:  Diagnosis Date  . Arthritis    OA AND PAIN BOTH KNEES  . Asthma    AS A CHILD  . Chronic knee pain   . Coronary artery disease    PTCA/stenting ramus inter. vess.; 10/2008  . GERD (gastroesophageal reflux disease)    MILD--WOULD TAKE ROLAID IF NEEDED  . Hernia    "AT MY BELLY BUTTON" - NO PAIN OR PROBLEMS  . Hyperlipidemia   . Hypertension   . Restless leg syndrome   . Seasonal allergies    FREQENT BRONCHITIS. SINUS INFECTIONS WITH SEASON CHANGES  . Shingles DEC 2012   MILD CASE-NO RESIDUAL PROBLEMS  . Sleep apnea 07/06/12   STOP BANG SCORE OF 4   Surgical Hx: The patient  has a past surgical history that includes Knee surgery; Cardiac catheterization; Coronary angioplasty; Total knee arthroplasty (07/26/2012); Total knee  arthroplasty (10/25/2012); and Joint replacement.   Current Medications: Current Meds  Medication Sig  . aspirin EC 81 MG tablet Take 81 mg by mouth every morning.  Marland Kitchen atorvastatin (LIPITOR) 40 MG tablet TAKE 1 TABLET (40 MG TOTAL) BY MOUTH AT BEDTIME please make yearly appt with Dr. Acie Fredrickson for March before anymore refills. 2nd attempt  . B COMPLEX-C-E PO Take 1 tablet by mouth daily.  . clopidogrel (PLAVIX) 75 MG tablet TAKE 1 TABLET (75 MG TOTAL) BY MOUTH EVERY MORNING Please call and schedule a one year follow up appointment for further refills 1st attempt  . diphenhydrAMINE (BENADRYL) 25 MG tablet Take 25 mg by mouth every morning.  . Fluticasone Propionate (FLONASE NA) Place 2 sprays into the nose as needed. For sinus infection  . lisinopril (PRINIVIL,ZESTRIL) 2.5 MG tablet Take 2.5 mg by mouth daily.  . metoprolol tartrate (LOPRESSOR) 25 MG tablet TAKE 1 TABLET BY MOUTH TWICE A DAY  . nitroGLYCERIN (NITROSTAT) 0.4 MG SL tablet Place 1 tablet (0.4 mg total) under the tongue every 5 (five) minutes as needed. For chest pain  . vitamin B-12 (CYANOCOBALAMIN) 1000 MCG tablet Take 1,000 mcg by mouth daily.  Marland Kitchen zinc gluconate 50 MG tablet Take 50 mg by mouth daily.     Allergies:  Clarithromycin   Social History   Tobacco Use  . Smoking status: Never Smoker  . Smokeless tobacco: Former Network engineer Use Topics  . Alcohol use: Yes    Comment: occ  . Drug use: No     Family Hx: The patient's family history includes COPD in his father.  ROS:   Please see the history of present illness.    Review of Systems  Respiratory: Positive for cough.    All other systems reviewed and are negative.   EKGs/Labs/Other Test Reviewed:    EKG:  EKG is  ordered today.  The ekg ordered today demonstrates sinus bradycardia, HR 59, RBBB, QTc 421 ms, no change since prior tracing  Recent Labs: No results found for requested labs within last 8760 hours.   Recent Lipid Panel Lab Results    Component Value Date/Time   CHOL 157 02/26/2017 12:03 PM   TRIG 216 (H) 02/26/2017 12:03 PM   HDL 57 02/26/2017 12:03 PM   CHOLHDL 2.8 02/26/2017 12:03 PM   CHOLHDL 2.5 02/27/2016 12:07 PM   LDLCALC 57 02/26/2017 12:03 PM    Physical Exam:    VS:  BP 140/90   Pulse 60   Ht 6\' 3"  (1.905 m)   Wt 221 lb 6.4 oz (100.4 kg)   SpO2 97%   BMI 27.67 kg/m     Wt Readings from Last 3 Encounters:  03/02/18 221 lb 6.4 oz (100.4 kg)  02/26/17 224 lb (101.6 kg)  02/27/16 222 lb (100.7 kg)     Physical Exam  Constitutional: He is oriented to person, place, and time. He appears well-developed and well-nourished. No distress.  HENT:  Head: Normocephalic and atraumatic.  Neck: No JVD present. Carotid bruit is not present.  Cardiovascular: Normal rate and regular rhythm.  No murmur heard. Pulmonary/Chest: Effort normal. He has no rales.  Abdominal: Soft.  Musculoskeletal: He exhibits no edema.  Neurological: He is alert and oriented to person, place, and time.  Skin: Skin is warm and dry.    ASSESSMENT & PLAN:    #1.  Coronary artery disease involving native coronary artery of native heart without angina pectoris Status post DES to the ramus intermediate in 2009.  He is doing well without anginal symptoms.  Continue aspirin, Plavix, statin, beta-blocker, lisinopril.  LDL in March 2018 was 57.  #2.  Essential hypertension Blood pressure elevated today.  However, he checks his blood pressure several times at home.  It is typically optimal.  Continue to monitor for now.  Dispo:  Return in about 1 year (around 03/03/2019) for Routine Follow Up, w/ Dr. Acie Fredrickson, or Richardson Dopp, PA-C.   Medication Adjustments/Labs and Tests Ordered: Current medicines are reviewed at length with the patient today.  Concerns regarding medicines are outlined above.  Tests Ordered: Orders Placed This Encounter  Procedures  . EKG 12-Lead   Medication Changes: No orders of the defined types were placed in  this encounter.   Signed, Richardson Dopp, PA-C  03/02/2018 1:58 PM    [facility] [place], [place], [place]  [address] Phone: [phone]; Fax: [phone]

## 2018-03-02 ENCOUNTER — Ambulatory Visit: Payer: PPO | Admitting: Physician Assistant

## 2018-03-02 ENCOUNTER — Encounter: Payer: Self-pay | Admitting: Physician Assistant

## 2018-03-02 VITALS — BP 140/90 | HR 60 | Ht 75.0 in | Wt 221.4 lb

## 2018-03-02 DIAGNOSIS — I10 Essential (primary) hypertension: Secondary | ICD-10-CM | POA: Diagnosis not present

## 2018-03-02 DIAGNOSIS — I2510 Atherosclerotic heart disease of native coronary artery without angina pectoris: Secondary | ICD-10-CM

## 2018-03-02 NOTE — Patient Instructions (Signed)
Medication Instructions:  Your physician recommends that you continue on your current medications as directed. Please refer to the Current Medication list given to you today.   Labwork: NONE ORDERED TODAY  Testing/Procedures: NONE ORDERED TODAY  Follow-Up: Your physician wants you to follow-up in: 1 YEAR WITH DR. Acie Fredrickson You will receive a reminder letter in the mail two months in advance. If you don't receive a letter, please call our office to schedule the follow-up appointment.   Any Other Special Instructions Will Be Listed Below (If Applicable).     If you need a refill on your cardiac medications before your next appointment, please call your pharmacy.

## 2018-03-18 DIAGNOSIS — H2513 Age-related nuclear cataract, bilateral: Secondary | ICD-10-CM | POA: Diagnosis not present

## 2018-03-18 DIAGNOSIS — H40013 Open angle with borderline findings, low risk, bilateral: Secondary | ICD-10-CM | POA: Diagnosis not present

## 2018-03-19 ENCOUNTER — Other Ambulatory Visit: Payer: Self-pay | Admitting: Cardiovascular Disease

## 2018-04-12 DIAGNOSIS — H9113 Presbycusis, bilateral: Secondary | ICD-10-CM | POA: Diagnosis not present

## 2018-04-12 DIAGNOSIS — R5383 Other fatigue: Secondary | ICD-10-CM | POA: Diagnosis not present

## 2018-04-12 DIAGNOSIS — Z131 Encounter for screening for diabetes mellitus: Secondary | ICD-10-CM | POA: Diagnosis not present

## 2018-04-12 DIAGNOSIS — E7800 Pure hypercholesterolemia, unspecified: Secondary | ICD-10-CM | POA: Diagnosis not present

## 2018-04-12 DIAGNOSIS — E785 Hyperlipidemia, unspecified: Secondary | ICD-10-CM | POA: Diagnosis not present

## 2018-04-12 DIAGNOSIS — Z0000 Encounter for general adult medical examination without abnormal findings: Secondary | ICD-10-CM | POA: Diagnosis not present

## 2018-04-12 DIAGNOSIS — Z125 Encounter for screening for malignant neoplasm of prostate: Secondary | ICD-10-CM | POA: Diagnosis not present

## 2018-04-12 DIAGNOSIS — R42 Dizziness and giddiness: Secondary | ICD-10-CM | POA: Diagnosis not present

## 2018-04-12 DIAGNOSIS — E039 Hypothyroidism, unspecified: Secondary | ICD-10-CM | POA: Diagnosis not present

## 2018-04-12 DIAGNOSIS — I70219 Atherosclerosis of native arteries of extremities with intermittent claudication, unspecified extremity: Secondary | ICD-10-CM | POA: Diagnosis not present

## 2018-04-12 DIAGNOSIS — Z1331 Encounter for screening for depression: Secondary | ICD-10-CM | POA: Diagnosis not present

## 2018-04-12 DIAGNOSIS — Z79899 Other long term (current) drug therapy: Secondary | ICD-10-CM | POA: Diagnosis not present

## 2018-04-12 DIAGNOSIS — R1032 Left lower quadrant pain: Secondary | ICD-10-CM | POA: Diagnosis not present

## 2018-04-12 DIAGNOSIS — Z1211 Encounter for screening for malignant neoplasm of colon: Secondary | ICD-10-CM | POA: Diagnosis not present

## 2018-04-12 DIAGNOSIS — Z1389 Encounter for screening for other disorder: Secondary | ICD-10-CM | POA: Diagnosis not present

## 2018-05-28 ENCOUNTER — Other Ambulatory Visit: Payer: Self-pay | Admitting: Cardiovascular Disease

## 2018-05-30 DIAGNOSIS — J019 Acute sinusitis, unspecified: Secondary | ICD-10-CM | POA: Diagnosis not present

## 2018-05-30 DIAGNOSIS — J209 Acute bronchitis, unspecified: Secondary | ICD-10-CM | POA: Diagnosis not present

## 2018-06-28 DIAGNOSIS — J209 Acute bronchitis, unspecified: Secondary | ICD-10-CM | POA: Diagnosis not present

## 2018-06-28 DIAGNOSIS — J029 Acute pharyngitis, unspecified: Secondary | ICD-10-CM | POA: Diagnosis not present

## 2018-07-01 DIAGNOSIS — K429 Umbilical hernia without obstruction or gangrene: Secondary | ICD-10-CM | POA: Diagnosis not present

## 2018-07-01 DIAGNOSIS — Z7901 Long term (current) use of anticoagulants: Secondary | ICD-10-CM | POA: Diagnosis not present

## 2018-07-20 ENCOUNTER — Other Ambulatory Visit: Payer: Self-pay | Admitting: Cardiovascular Disease

## 2018-08-22 DIAGNOSIS — J029 Acute pharyngitis, unspecified: Secondary | ICD-10-CM | POA: Diagnosis not present

## 2018-08-22 DIAGNOSIS — J209 Acute bronchitis, unspecified: Secondary | ICD-10-CM | POA: Diagnosis not present

## 2018-12-05 DIAGNOSIS — J209 Acute bronchitis, unspecified: Secondary | ICD-10-CM | POA: Diagnosis not present

## 2018-12-05 DIAGNOSIS — I10 Essential (primary) hypertension: Secondary | ICD-10-CM | POA: Diagnosis not present

## 2019-01-09 DIAGNOSIS — I10 Essential (primary) hypertension: Secondary | ICD-10-CM | POA: Diagnosis not present

## 2019-01-09 DIAGNOSIS — H6121 Impacted cerumen, right ear: Secondary | ICD-10-CM | POA: Diagnosis not present

## 2019-02-20 ENCOUNTER — Other Ambulatory Visit: Payer: Self-pay | Admitting: Cardiovascular Disease

## 2019-02-23 ENCOUNTER — Encounter: Payer: Self-pay | Admitting: Cardiovascular Disease

## 2019-02-28 ENCOUNTER — Telehealth: Payer: Self-pay | Admitting: Cardiovascular Disease

## 2019-02-28 NOTE — Telephone Encounter (Signed)
Spoke with pt and he denies CP, SOB, swelling or any other cardiac issues.  Still working out a few times a week.  Advised I will cancel appt for 3/30 and we will plan to reschedule once this virus clears.  Advised to call if any cardiac issues before follow up.  Pt appreciative for call.

## 2019-02-28 NOTE — Telephone Encounter (Signed)
New Message:    Pt wants to know if he needs to cx his 03-06-19 appt?

## 2019-03-01 NOTE — Telephone Encounter (Signed)
This office visit is a routine  Level 3 for covid priority.

## 2019-03-06 ENCOUNTER — Ambulatory Visit: Payer: PPO | Admitting: Cardiovascular Disease

## 2019-03-09 DIAGNOSIS — J019 Acute sinusitis, unspecified: Secondary | ICD-10-CM | POA: Diagnosis not present

## 2019-03-09 DIAGNOSIS — J328 Other chronic sinusitis: Secondary | ICD-10-CM | POA: Diagnosis not present

## 2019-03-09 DIAGNOSIS — Z833 Family history of diabetes mellitus: Secondary | ICD-10-CM | POA: Diagnosis not present

## 2019-03-09 DIAGNOSIS — N182 Chronic kidney disease, stage 2 (mild): Secondary | ICD-10-CM | POA: Diagnosis not present

## 2019-03-09 DIAGNOSIS — I482 Chronic atrial fibrillation, unspecified: Secondary | ICD-10-CM | POA: Diagnosis not present

## 2019-03-09 DIAGNOSIS — I10 Essential (primary) hypertension: Secondary | ICD-10-CM | POA: Diagnosis not present

## 2019-03-09 DIAGNOSIS — J209 Acute bronchitis, unspecified: Secondary | ICD-10-CM | POA: Diagnosis not present

## 2019-03-09 DIAGNOSIS — E785 Hyperlipidemia, unspecified: Secondary | ICD-10-CM | POA: Diagnosis not present

## 2019-03-09 DIAGNOSIS — E1122 Type 2 diabetes mellitus with diabetic chronic kidney disease: Secondary | ICD-10-CM | POA: Diagnosis not present

## 2019-03-09 DIAGNOSIS — M353 Polymyalgia rheumatica: Secondary | ICD-10-CM | POA: Diagnosis not present

## 2019-03-09 DIAGNOSIS — Z4789 Encounter for other orthopedic aftercare: Secondary | ICD-10-CM | POA: Diagnosis not present

## 2019-03-09 DIAGNOSIS — M069 Rheumatoid arthritis, unspecified: Secondary | ICD-10-CM | POA: Diagnosis not present

## 2019-03-09 DIAGNOSIS — E1159 Type 2 diabetes mellitus with other circulatory complications: Secondary | ICD-10-CM | POA: Diagnosis not present

## 2019-03-09 DIAGNOSIS — Z7952 Long term (current) use of systemic steroids: Secondary | ICD-10-CM | POA: Diagnosis not present

## 2019-03-09 DIAGNOSIS — N401 Enlarged prostate with lower urinary tract symptoms: Secondary | ICD-10-CM | POA: Diagnosis not present

## 2019-03-09 DIAGNOSIS — Z794 Long term (current) use of insulin: Secondary | ICD-10-CM | POA: Diagnosis not present

## 2019-03-09 DIAGNOSIS — Z79899 Other long term (current) drug therapy: Secondary | ICD-10-CM | POA: Diagnosis not present

## 2019-03-09 DIAGNOSIS — D72819 Decreased white blood cell count, unspecified: Secondary | ICD-10-CM | POA: Diagnosis not present

## 2019-03-09 DIAGNOSIS — K5900 Constipation, unspecified: Secondary | ICD-10-CM | POA: Diagnosis not present

## 2019-03-09 DIAGNOSIS — R338 Other retention of urine: Secondary | ICD-10-CM | POA: Diagnosis not present

## 2019-03-09 DIAGNOSIS — M109 Gout, unspecified: Secondary | ICD-10-CM | POA: Diagnosis not present

## 2019-03-09 DIAGNOSIS — I129 Hypertensive chronic kidney disease with stage 1 through stage 4 chronic kidney disease, or unspecified chronic kidney disease: Secondary | ICD-10-CM | POA: Diagnosis not present

## 2019-03-11 ENCOUNTER — Other Ambulatory Visit: Payer: Self-pay | Admitting: Cardiovascular Disease

## 2019-04-11 ENCOUNTER — Other Ambulatory Visit: Payer: Self-pay | Admitting: Cardiovascular Disease

## 2019-04-17 DIAGNOSIS — R002 Palpitations: Secondary | ICD-10-CM | POA: Diagnosis not present

## 2019-04-17 DIAGNOSIS — I10 Essential (primary) hypertension: Secondary | ICD-10-CM | POA: Diagnosis not present

## 2019-04-17 DIAGNOSIS — I70219 Atherosclerosis of native arteries of extremities with intermittent claudication, unspecified extremity: Secondary | ICD-10-CM | POA: Diagnosis not present

## 2019-04-17 DIAGNOSIS — Z1389 Encounter for screening for other disorder: Secondary | ICD-10-CM | POA: Diagnosis not present

## 2019-04-17 DIAGNOSIS — Z1331 Encounter for screening for depression: Secondary | ICD-10-CM | POA: Diagnosis not present

## 2019-04-17 DIAGNOSIS — Z1211 Encounter for screening for malignant neoplasm of colon: Secondary | ICD-10-CM | POA: Diagnosis not present

## 2019-04-17 DIAGNOSIS — D485 Neoplasm of uncertain behavior of skin: Secondary | ICD-10-CM | POA: Diagnosis not present

## 2019-04-17 DIAGNOSIS — R1032 Left lower quadrant pain: Secondary | ICD-10-CM | POA: Diagnosis not present

## 2019-04-17 DIAGNOSIS — H9113 Presbycusis, bilateral: Secondary | ICD-10-CM | POA: Diagnosis not present

## 2019-04-17 DIAGNOSIS — R42 Dizziness and giddiness: Secondary | ICD-10-CM | POA: Diagnosis not present

## 2019-04-17 DIAGNOSIS — E039 Hypothyroidism, unspecified: Secondary | ICD-10-CM | POA: Diagnosis not present

## 2019-04-17 DIAGNOSIS — R5383 Other fatigue: Secondary | ICD-10-CM | POA: Diagnosis not present

## 2019-04-17 DIAGNOSIS — E7800 Pure hypercholesterolemia, unspecified: Secondary | ICD-10-CM | POA: Diagnosis not present

## 2019-04-17 DIAGNOSIS — E785 Hyperlipidemia, unspecified: Secondary | ICD-10-CM | POA: Diagnosis not present

## 2019-04-17 DIAGNOSIS — Z79899 Other long term (current) drug therapy: Secondary | ICD-10-CM | POA: Diagnosis not present

## 2019-04-17 DIAGNOSIS — Z0000 Encounter for general adult medical examination without abnormal findings: Secondary | ICD-10-CM | POA: Diagnosis not present

## 2019-06-01 DIAGNOSIS — H2513 Age-related nuclear cataract, bilateral: Secondary | ICD-10-CM | POA: Diagnosis not present

## 2019-06-01 DIAGNOSIS — I10 Essential (primary) hypertension: Secondary | ICD-10-CM | POA: Diagnosis not present

## 2019-06-01 DIAGNOSIS — D485 Neoplasm of uncertain behavior of skin: Secondary | ICD-10-CM | POA: Diagnosis not present

## 2019-06-01 DIAGNOSIS — H40013 Open angle with borderline findings, low risk, bilateral: Secondary | ICD-10-CM | POA: Diagnosis not present

## 2019-06-04 ENCOUNTER — Inpatient Hospital Stay (HOSPITAL_COMMUNITY)
Admission: EM | Admit: 2019-06-04 | Discharge: 2019-06-14 | DRG: 233 | Disposition: A | Discharge status: Home / Self Care | Payer: PPO | Attending: Cardiothoracic Surgery | Admitting: Cardiothoracic Surgery

## 2019-06-04 ENCOUNTER — Other Ambulatory Visit: Payer: Self-pay

## 2019-06-04 ENCOUNTER — Emergency Department (HOSPITAL_COMMUNITY): Payer: PPO

## 2019-06-04 ENCOUNTER — Encounter (HOSPITAL_COMMUNITY): Payer: Self-pay | Admitting: Emergency Medicine

## 2019-06-04 ENCOUNTER — Encounter (HOSPITAL_COMMUNITY)
Admission: EM | Disposition: A | Discharge status: Home / Self Care | Payer: Self-pay | Source: Home / Self Care | Attending: Cardiothoracic Surgery

## 2019-06-04 DIAGNOSIS — Z951 Presence of aortocoronary bypass graft: Secondary | ICD-10-CM | POA: Diagnosis not present

## 2019-06-04 DIAGNOSIS — I462 Cardiac arrest due to underlying cardiac condition: Secondary | ICD-10-CM | POA: Diagnosis present

## 2019-06-04 DIAGNOSIS — G4733 Obstructive sleep apnea (adult) (pediatric): Secondary | ICD-10-CM | POA: Diagnosis present

## 2019-06-04 DIAGNOSIS — Z1159 Encounter for screening for other viral diseases: Secondary | ICD-10-CM | POA: Diagnosis not present

## 2019-06-04 DIAGNOSIS — R000 Tachycardia, unspecified: Secondary | ICD-10-CM | POA: Diagnosis not present

## 2019-06-04 DIAGNOSIS — Z01810 Encounter for preprocedural cardiovascular examination: Secondary | ICD-10-CM | POA: Diagnosis not present

## 2019-06-04 DIAGNOSIS — E785 Hyperlipidemia, unspecified: Secondary | ICD-10-CM | POA: Diagnosis not present

## 2019-06-04 DIAGNOSIS — K219 Gastro-esophageal reflux disease without esophagitis: Secondary | ICD-10-CM | POA: Diagnosis present

## 2019-06-04 DIAGNOSIS — S0993XA Unspecified injury of face, initial encounter: Secondary | ICD-10-CM | POA: Diagnosis not present

## 2019-06-04 DIAGNOSIS — J90 Pleural effusion, not elsewhere classified: Secondary | ICD-10-CM | POA: Diagnosis not present

## 2019-06-04 DIAGNOSIS — I214 Non-ST elevation (NSTEMI) myocardial infarction: Principal | ICD-10-CM

## 2019-06-04 DIAGNOSIS — J9811 Atelectasis: Secondary | ICD-10-CM | POA: Diagnosis not present

## 2019-06-04 DIAGNOSIS — I2510 Atherosclerotic heart disease of native coronary artery without angina pectoris: Secondary | ICD-10-CM

## 2019-06-04 DIAGNOSIS — Z96653 Presence of artificial knee joint, bilateral: Secondary | ICD-10-CM | POA: Diagnosis not present

## 2019-06-04 DIAGNOSIS — G2581 Restless legs syndrome: Secondary | ICD-10-CM | POA: Diagnosis present

## 2019-06-04 DIAGNOSIS — M7121 Synovial cyst of popliteal space [Baker], right knee: Secondary | ICD-10-CM | POA: Diagnosis present

## 2019-06-04 DIAGNOSIS — E876 Hypokalemia: Secondary | ICD-10-CM | POA: Diagnosis not present

## 2019-06-04 DIAGNOSIS — M79609 Pain in unspecified limb: Secondary | ICD-10-CM | POA: Diagnosis not present

## 2019-06-04 DIAGNOSIS — Z7902 Long term (current) use of antithrombotics/antiplatelets: Secondary | ICD-10-CM

## 2019-06-04 DIAGNOSIS — R404 Transient alteration of awareness: Secondary | ICD-10-CM | POA: Diagnosis not present

## 2019-06-04 DIAGNOSIS — Z7982 Long term (current) use of aspirin: Secondary | ICD-10-CM | POA: Diagnosis not present

## 2019-06-04 DIAGNOSIS — I340 Nonrheumatic mitral (valve) insufficiency: Secondary | ICD-10-CM | POA: Diagnosis not present

## 2019-06-04 DIAGNOSIS — K5900 Constipation, unspecified: Secondary | ICD-10-CM | POA: Diagnosis not present

## 2019-06-04 DIAGNOSIS — I10 Essential (primary) hypertension: Secondary | ICD-10-CM | POA: Diagnosis present

## 2019-06-04 DIAGNOSIS — M7989 Other specified soft tissue disorders: Secondary | ICD-10-CM | POA: Diagnosis not present

## 2019-06-04 DIAGNOSIS — I4510 Unspecified right bundle-branch block: Secondary | ICD-10-CM | POA: Diagnosis not present

## 2019-06-04 DIAGNOSIS — R58 Hemorrhage, not elsewhere classified: Secondary | ICD-10-CM | POA: Diagnosis not present

## 2019-06-04 DIAGNOSIS — I252 Old myocardial infarction: Secondary | ICD-10-CM

## 2019-06-04 DIAGNOSIS — E782 Mixed hyperlipidemia: Secondary | ICD-10-CM | POA: Diagnosis not present

## 2019-06-04 DIAGNOSIS — Z01818 Encounter for other preprocedural examination: Secondary | ICD-10-CM | POA: Diagnosis not present

## 2019-06-04 DIAGNOSIS — Z881 Allergy status to other antibiotic agents status: Secondary | ICD-10-CM | POA: Diagnosis not present

## 2019-06-04 DIAGNOSIS — I469 Cardiac arrest, cause unspecified: Secondary | ICD-10-CM

## 2019-06-04 DIAGNOSIS — S199XXA Unspecified injury of neck, initial encounter: Secondary | ICD-10-CM | POA: Diagnosis not present

## 2019-06-04 DIAGNOSIS — I259 Chronic ischemic heart disease, unspecified: Secondary | ICD-10-CM

## 2019-06-04 DIAGNOSIS — Z48812 Encounter for surgical aftercare following surgery on the circulatory system: Secondary | ICD-10-CM | POA: Diagnosis not present

## 2019-06-04 DIAGNOSIS — D62 Acute posthemorrhagic anemia: Secondary | ICD-10-CM | POA: Diagnosis not present

## 2019-06-04 DIAGNOSIS — Z825 Family history of asthma and other chronic lower respiratory diseases: Secondary | ICD-10-CM | POA: Diagnosis not present

## 2019-06-04 DIAGNOSIS — Z79899 Other long term (current) drug therapy: Secondary | ICD-10-CM

## 2019-06-04 DIAGNOSIS — Z20828 Contact with and (suspected) exposure to other viral communicable diseases: Secondary | ICD-10-CM | POA: Diagnosis not present

## 2019-06-04 DIAGNOSIS — R918 Other nonspecific abnormal finding of lung field: Secondary | ICD-10-CM | POA: Diagnosis not present

## 2019-06-04 DIAGNOSIS — Z955 Presence of coronary angioplasty implant and graft: Secondary | ICD-10-CM

## 2019-06-04 DIAGNOSIS — Z87891 Personal history of nicotine dependence: Secondary | ICD-10-CM

## 2019-06-04 DIAGNOSIS — I213 ST elevation (STEMI) myocardial infarction of unspecified site: Secondary | ICD-10-CM

## 2019-06-04 DIAGNOSIS — I499 Cardiac arrhythmia, unspecified: Secondary | ICD-10-CM | POA: Diagnosis not present

## 2019-06-04 DIAGNOSIS — S0990XA Unspecified injury of head, initial encounter: Secondary | ICD-10-CM | POA: Diagnosis not present

## 2019-06-04 HISTORY — PX: CORONARY/GRAFT ACUTE MI REVASCULARIZATION: CATH118305

## 2019-06-04 HISTORY — PX: LEFT HEART CATH AND CORONARY ANGIOGRAPHY: CATH118249

## 2019-06-04 LAB — COMPREHENSIVE METABOLIC PANEL
ALT: 72 U/L — ABNORMAL HIGH (ref 0–44)
AST: 81 U/L — ABNORMAL HIGH (ref 15–41)
Albumin: 3.8 g/dL (ref 3.5–5.0)
Alkaline Phosphatase: 57 U/L (ref 38–126)
Anion gap: 16 — ABNORMAL HIGH (ref 5–15)
BUN: 13 mg/dL (ref 8–23)
CO2: 14 mmol/L — ABNORMAL LOW (ref 22–32)
Calcium: 8.9 mg/dL (ref 8.9–10.3)
Chloride: 109 mmol/L (ref 98–111)
Creatinine, Ser: 1.3 mg/dL — ABNORMAL HIGH (ref 0.61–1.24)
GFR calc Af Amer: >60 mL/min (ref >60)
GFR calc non Af Amer: 54 mL/min — ABNORMAL LOW (ref >60)
Glucose, Bld: 158 mg/dL — ABNORMAL HIGH (ref 70–99)
Potassium: 3.9 mmol/L (ref 3.5–5.1)
Sodium: 139 mmol/L (ref 135–145)
Total Bilirubin: 1 mg/dL (ref 0.3–1.2)
Total Protein: 6.3 g/dL — ABNORMAL LOW (ref 6.5–8.1)

## 2019-06-04 LAB — LIPID PANEL
Cholesterol: 140 mg/dL (ref 0–200)
HDL: 56 mg/dL (ref >40)
LDL Cholesterol: 61 mg/dL (ref 0–99)
Total CHOL/HDL Ratio: 2.5 RATIO
Triglycerides: 114 mg/dL (ref <150)
VLDL: 23 mg/dL (ref 0–40)

## 2019-06-04 LAB — CBC WITH DIFFERENTIAL/PLATELET
Abs Immature Granulocytes: 0.04 10*3/uL (ref 0.00–0.07)
Basophils Absolute: 0.1 10*3/uL (ref 0.0–0.1)
Basophils Relative: 1 %
Eosinophils Absolute: 0.2 10*3/uL (ref 0.0–0.5)
Eosinophils Relative: 2 %
HCT: 43.2 % (ref 39.0–52.0)
Hemoglobin: 14.5 g/dL (ref 13.0–17.0)
Immature Granulocytes: 0 %
Lymphocytes Relative: 29 %
Lymphs Abs: 2.9 10*3/uL (ref 0.7–4.0)
MCH: 32.5 pg (ref 26.0–34.0)
MCHC: 33.6 g/dL (ref 30.0–36.0)
MCV: 96.9 fL (ref 80.0–100.0)
Monocytes Absolute: 1 10*3/uL (ref 0.1–1.0)
Monocytes Relative: 10 %
Neutro Abs: 5.6 10*3/uL (ref 1.7–7.7)
Neutrophils Relative %: 58 %
Platelets: 153 10*3/uL (ref 150–400)
RBC: 4.46 MIL/uL (ref 4.22–5.81)
RDW: 13 % (ref 11.5–15.5)
WBC: 9.9 10*3/uL (ref 4.0–10.5)
nRBC: 0 % (ref 0.0–0.2)

## 2019-06-04 LAB — TROPONIN I (HIGH SENSITIVITY)
Troponin I (High Sensitivity): 8 ng/L (ref <18)
Troponin I (High Sensitivity): 166 ng/L (ref <18)
Troponin I (High Sensitivity): 358 ng/L (ref <18)
Troponin I (High Sensitivity): 455 ng/L (ref <18)

## 2019-06-04 LAB — PROTIME-INR
INR: 1.2 (ref 0.8–1.2)
Prothrombin Time: 14.6 seconds (ref 11.4–15.2)

## 2019-06-04 LAB — MRSA PCR SCREENING: MRSA by PCR: NEGATIVE

## 2019-06-04 LAB — APTT: aPTT: 27 seconds (ref 24–36)

## 2019-06-04 LAB — SARS CORONAVIRUS 2 BY RT PCR (HOSPITAL ORDER, PERFORMED IN ~~LOC~~ HOSPITAL LAB): SARS Coronavirus 2: NEGATIVE

## 2019-06-04 SURGERY — CORONARY/GRAFT ACUTE MI REVASCULARIZATION
Anesthesia: LOCAL

## 2019-06-04 MED ORDER — ASPIRIN 325 MG PO TABS
ORAL_TABLET | ORAL | Status: AC
  Filled 2019-06-04: qty 1

## 2019-06-04 MED ORDER — SODIUM CHLORIDE 0.9 % IV SOLN
INTRAVENOUS | Status: DC | PRN
  Administered 2019-06-04: 10 mL/h via INTRAVENOUS

## 2019-06-04 MED ORDER — CHLORHEXIDINE GLUCONATE CLOTH 2 % EX PADS
6.0000 | MEDICATED_PAD | Freq: Every day | CUTANEOUS | Status: DC
  Administered 2019-06-05 – 2019-06-06 (×2): 6 via TOPICAL

## 2019-06-04 MED ORDER — ASPIRIN EC 81 MG PO TBEC
81.0000 mg | DELAYED_RELEASE_TABLET | Freq: Every morning | ORAL | Status: DC
  Administered 2019-06-05 – 2019-06-07 (×3): 81 mg via ORAL
  Filled 2019-06-04 (×3): qty 1

## 2019-06-04 MED ORDER — CANGRELOR TETRASODIUM 50 MG IV SOLR
INTRAVENOUS | Status: AC
  Filled 2019-06-04: qty 50

## 2019-06-04 MED ORDER — HEPARIN (PORCINE) IN NACL 1000-0.9 UT/500ML-% IV SOLN
INTRAVENOUS | Status: AC
  Filled 2019-06-04: qty 1000

## 2019-06-04 MED ORDER — CANGRELOR BOLUS VIA INFUSION
INTRAVENOUS | Status: DC | PRN
  Administered 2019-06-04: 2763 ug via INTRAVENOUS

## 2019-06-04 MED ORDER — LABETALOL HCL 5 MG/ML IV SOLN: 10.0000 mg | INTRAVENOUS | Status: AC | PRN

## 2019-06-04 MED ORDER — VERAPAMIL HCL 2.5 MG/ML IV SOLN
INTRAVENOUS | Status: DC | PRN
  Administered 2019-06-04: 10 mL via INTRA_ARTERIAL

## 2019-06-04 MED ORDER — HEPARIN (PORCINE) 25000 UT/250ML-% IV SOLN
1300.0000 [IU]/h | INTRAVENOUS | Status: DC
  Administered 2019-06-04: 1100 [IU]/h via INTRAVENOUS
  Administered 2019-06-05 – 2019-06-07 (×3): 1300 [IU]/h via INTRAVENOUS
  Filled 2019-06-04 (×4): qty 250

## 2019-06-04 MED ORDER — SODIUM CHLORIDE 0.9 % IV SOLN: 250.0000 mL | INTRAVENOUS | Status: DC | PRN

## 2019-06-04 MED ORDER — SODIUM CHLORIDE 0.9 % IV SOLN
INTRAVENOUS | Status: AC
  Administered 2019-06-04: 17:00:00 via INTRAVENOUS

## 2019-06-04 MED ORDER — ACETAMINOPHEN 325 MG PO TABS
650.0000 mg | ORAL_TABLET | ORAL | Status: DC | PRN
  Administered 2019-06-07 (×2): 650 mg via ORAL
  Filled 2019-06-04 (×2): qty 2

## 2019-06-04 MED ORDER — HYDRALAZINE HCL 20 MG/ML IJ SOLN
10.0000 mg | INTRAMUSCULAR | Status: AC | PRN
  Administered 2019-06-04: 10 mg via INTRAVENOUS
  Filled 2019-06-04: qty 1

## 2019-06-04 MED ORDER — LIDOCAINE HCL (PF) 1 % IJ SOLN
INTRAMUSCULAR | Status: AC
  Filled 2019-06-04: qty 30

## 2019-06-04 MED ORDER — ONDANSETRON HCL 4 MG/2ML IJ SOLN: 4.0000 mg | Freq: Four times a day (QID) | INTRAMUSCULAR | Status: DC | PRN

## 2019-06-04 MED ORDER — VERAPAMIL HCL 2.5 MG/ML IV SOLN
INTRAVENOUS | Status: AC
  Filled 2019-06-04: qty 2

## 2019-06-04 MED ORDER — NITROGLYCERIN 0.4 MG SL SUBL: 0.4000 mg | SUBLINGUAL_TABLET | SUBLINGUAL | Status: DC | PRN

## 2019-06-04 MED ORDER — SODIUM CHLORIDE 0.9 % IV SOLN
INTRAVENOUS | Status: DC | PRN
  Administered 2019-06-04: 4 ug/kg/min via INTRAVENOUS

## 2019-06-04 MED ORDER — SODIUM CHLORIDE 0.9% FLUSH
3.0000 mL | Freq: Two times a day (BID) | INTRAVENOUS | Status: DC
  Administered 2019-06-05 – 2019-06-06 (×6): 3 mL via INTRAVENOUS

## 2019-06-04 MED ORDER — METOPROLOL TARTRATE 25 MG PO TABS
25.0000 mg | ORAL_TABLET | Freq: Two times a day (BID) | ORAL | Status: DC
  Administered 2019-06-04 – 2019-06-07 (×7): 25 mg via ORAL
  Filled 2019-06-04: qty 2
  Filled 2019-06-04: qty 1
  Filled 2019-06-04: qty 2
  Filled 2019-06-04: qty 1
  Filled 2019-06-04: qty 2
  Filled 2019-06-04: qty 1
  Filled 2019-06-04: qty 2

## 2019-06-04 MED ORDER — LIP MEDEX EX OINT
TOPICAL_OINTMENT | CUTANEOUS | Status: DC | PRN
  Filled 2019-06-04: qty 7

## 2019-06-04 MED ORDER — SODIUM CHLORIDE 0.9 % IV SOLN: INTRAVENOUS | Status: DC

## 2019-06-04 MED ORDER — HEPARIN SODIUM (PORCINE) 1000 UNIT/ML IJ SOLN
INTRAMUSCULAR | Status: AC
  Filled 2019-06-04: qty 1

## 2019-06-04 MED ORDER — HEPARIN SODIUM (PORCINE) 1000 UNIT/ML IJ SOLN
INTRAMUSCULAR | Status: DC | PRN
  Administered 2019-06-04: 10000 [IU] via INTRAVENOUS

## 2019-06-04 MED ORDER — HEPARIN (PORCINE) 25000 UT/250ML-% IV SOLN: 1100.0000 [IU]/h | INTRAVENOUS | Status: DC

## 2019-06-04 MED ORDER — LIDOCAINE HCL (PF) 1 % IJ SOLN
INTRAMUSCULAR | Status: DC | PRN
  Administered 2019-06-04: 2 mL

## 2019-06-04 MED ORDER — ONDANSETRON HCL 4 MG/2ML IJ SOLN
INTRAMUSCULAR | Status: DC | PRN
  Administered 2019-06-04: 4 mg via INTRAVENOUS

## 2019-06-04 MED ORDER — SODIUM CHLORIDE 0.9 % IV SOLN
4.0000 ug/kg/min | INTRAVENOUS | Status: DC
  Administered 2019-06-04: 4 ug/kg/min via INTRAVENOUS
  Filled 2019-06-04: qty 50

## 2019-06-04 MED ORDER — SODIUM CHLORIDE 0.9% FLUSH: 3.0000 mL | INTRAVENOUS | Status: DC | PRN

## 2019-06-04 MED ORDER — ONDANSETRON HCL 4 MG/2ML IJ SOLN
INTRAMUSCULAR | Status: AC
  Filled 2019-06-04: qty 2

## 2019-06-04 MED ORDER — SODIUM CHLORIDE 0.9 % IV SOLN
4.0000 ug/kg/min | INTRAVENOUS | Status: DC
  Filled 2019-06-04: qty 50

## 2019-06-04 MED ORDER — ATORVASTATIN CALCIUM 80 MG PO TABS
80.0000 mg | ORAL_TABLET | Freq: Every day | ORAL | Status: DC
  Administered 2019-06-04 – 2019-06-09 (×5): 80 mg via ORAL
  Filled 2019-06-04 (×5): qty 1

## 2019-06-04 SURGICAL SUPPLY — 17 items
BAG SNAP BAND KOVER 36X36 (MISCELLANEOUS) ×2 IMPLANT
BALLN SAPPHIRE 2.5X12 (BALLOONS) ×2
BALLOON SAPPHIRE 2.5X12 (BALLOONS) ×1 IMPLANT
CATH 5FR JL3.5 JR4 ANG PIG MP (CATHETERS) ×2 IMPLANT
CATH VISTA GUIDE 6FR XBLAD3.5 (CATHETERS) ×2 IMPLANT
COVER DOME SNAP 22 D (MISCELLANEOUS) ×2 IMPLANT
DEVICE RAD COMP TR BAND LRG (VASCULAR PRODUCTS) ×2 IMPLANT
GLIDESHEATH SLEND SS 6F .021 (SHEATH) ×2 IMPLANT
GUIDEWIRE INQWIRE 1.5J.035X260 (WIRE) ×1 IMPLANT
INQWIRE 1.5J .035X260CM (WIRE) ×2
KIT HEART LEFT (KITS) ×2 IMPLANT
PACK CARDIAC CATHETERIZATION (CUSTOM PROCEDURE TRAY) ×2 IMPLANT
SYR MEDRAD MARK 7 150ML (SYRINGE) ×2 IMPLANT
TRANSDUCER W/STOPCOCK (MISCELLANEOUS) ×2 IMPLANT
TUBING CIL FLEX 10 FLL-RA (TUBING) ×2 IMPLANT
WIRE COUGAR XT STRL 190CM (WIRE) ×4 IMPLANT
WIRE HI TORQ WHISPER MS 190CM (WIRE) ×2 IMPLANT

## 2019-06-04 NOTE — H&P (Signed)
Cardiology Admission History and Physical:   Patient ID: [name] MRN: [id number]; DOB: 07/20/1946   Admission date: 06/04/2019  Primary Care Provider: Lorene Dy, MD Primary Cardiologist: Mertie Moores, MD  Primary Electrophysiologist:  None   Chief Complaint:  Syncope/Cardiac arrest   History of Present Illness:   [name] is a 73 yo male with history of CAD s/p DES placement in the ramus intermediate branch in 2009, GERD, HTN, HLD and sleep apnea who is presenting to the ED today via EMS after an out of hospital cardiac arrest. 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 He was not responsive. No pulse felt by the onlookers so CPR was started. When EMS arrived he had a pulse. No medications were given. He was breathing and talking so not intubated. Head and neck CT in ED with no spinal trauma and no IC hemorrhage. EKG with subtle ST changes in the lateral leads and the anterolateral leads. Code STEMI was activated by EMS. Upon my assessment in the ED, the patient was awake but confused and denied chest pain or dyspnea.   Past Medical History:  Diagnosis Date   Arthritis    OA AND PAIN BOTH KNEES   Asthma    AS A CHILD   Chronic knee pain    Coronary artery disease    PTCA/stenting ramus inter. vess.; 10/2008   GERD (gastroesophageal reflux disease)    MILD--WOULD TAKE ROLAID IF NEEDED   Hernia    "AT MY BELLY BUTTON" - NO PAIN OR PROBLEMS   Hyperlipidemia    Hypertension    Restless leg syndrome    Seasonal allergies    FREQENT BRONCHITIS. SINUS INFECTIONS WITH SEASON CHANGES   Shingles DEC 2012   MILD CASE-NO RESIDUAL PROBLEMS   Sleep apnea 07/06/12   STOP BANG SCORE OF 4    Past Surgical History:  Procedure Laterality Date   CARDIAC CATHETERIZATION     10/30/2008;  PTCA/stent ramus interm. vess.   CORONARY ANGIOPLASTY     JOINT REPLACEMENT     KNEE SURGERY     right and left   TOTAL  KNEE ARTHROPLASTY  07/26/2012   Procedure: TOTAL KNEE ARTHROPLASTY;  Surgeon: Mauri Pole, MD;  Location: [facility];  Service: Orthopedics;  Laterality: Left;   TOTAL KNEE ARTHROPLASTY  10/25/2012   Procedure: TOTAL KNEE ARTHROPLASTY;  Surgeon: Mauri Pole, MD;  Location: [facility];  Service: Orthopedics;  Laterality: Right;     Medications Prior to Admission: Prior to Admission medications   Medication Sig Start Date End Date Taking? Authorizing Provider  aspirin EC 81 MG tablet Take 81 mg by mouth every morning.    [provider]  atorvastatin (LIPITOR) 40 MG tablet TAKE 1 TABLET BY MOUTH EVERY DAY 04/11/19   Nahser, Wonda Cheng, MD  B COMPLEX-C-E PO Take 1 tablet by mouth daily.    [provider]  clopidogrel (PLAVIX) 75 MG tablet TAKE 1 TABLET (75 MG TOTAL) BY MOUTH EVERY MORNING. 04/11/19   Nahser, Wonda Cheng, MD  diphenhydrAMINE (BENADRYL) 25 MG tablet Take 25 mg by mouth every morning.    [provider]  Fluticasone Propionate (FLONASE NA) Place 2 sprays into the nose as needed. For sinus infection    [provider]  lisinopril (PRINIVIL,ZESTRIL) 2.5 MG tablet Take 2.5 mg by mouth daily.    [provider]  metoprolol tartrate (LOPRESSOR) 25 MG tablet TAKE  1 TABLET BY MOUTH TWICE A DAY 03/13/19   Nahser, Wonda Cheng, MD  nitroGLYCERIN (NITROSTAT) 0.4 MG SL tablet Place 1 tablet (0.4 mg total) under the tongue every 5 (five) minutes as needed. For chest pain 01/27/17   Nahser, Wonda Cheng, MD  vitamin B-12 (CYANOCOBALAMIN) 1000 MCG tablet Take 1,000 mcg by mouth daily.    [provider]  zinc gluconate 50 MG tablet Take 50 mg by mouth daily.    [provider]     Allergies:    Allergies  Allergen Reactions   Clarithromycin Other (See Comments)    Reaction=dizziness    Social History:   Social History   Socioeconomic History   Marital status: Married    Spouse name: Not on file   Number of children: Not on file   Years of  education: Not on file   Highest education level: Not on file  Occupational History   Not on file  Social Needs   Financial resource strain: Not on file   Food insecurity    Worry: Not on file    Inability: Not on file   Transportation needs    Medical: Not on file    Non-medical: Not on file  Tobacco Use   Smoking status: Never Smoker   Smokeless tobacco: Former Systems developer  Substance and Sexual Activity   Alcohol use: Yes    Comment: occ   Drug use: No   Sexual activity: Not on file  Lifestyle   Physical activity    Days per week: Not on file    Minutes per session: Not on file   Stress: Not on file  Relationships   Social connections    Talks on phone: Not on file    Gets together: Not on file    Attends religious service: Not on file    Active member of club or organization: Not on file    Attends meetings of clubs or organizations: Not on file    Relationship status: Not on file   Intimate partner violence    Fear of current or ex partner: Not on file    Emotionally abused: Not on file    Physically abused: Not on file    Forced sexual activity: Not on file  Other Topics Concern   Not on file  Social History Narrative   Not on file    Family History:   The patient's family history includes COPD in his father.    ROS:  Please see the history of present illness.  All other ROS reviewed and negative.     Physical Exam/Data:   Vitals:   06/04/19 1554 06/04/19 1559 06/04/19 1604 06/04/19 1609  BP: 128/81 (!) 142/92 139/86 (!) 138/93  Pulse: 89 87 90 93  Resp: 15 11 12 17   Temp:      TempSrc:      SpO2: 100% 100% 100% 100%  Weight:      Height:       No intake or output data in the 24 hours ending 06/04/19 1644 Last 3 Weights 06/04/2019 03/02/2018 02/26/2017  Weight (lbs) 203 lb 221 lb 6.4 oz 224 lb  Weight (kg) 92.08 kg 100.426 kg 101.606 kg     Body mass index is 25.37 kg/m.  General:  Well nourished, well developed, agitated HEENT:  normal Lymph: no adenopathy Neck: no JVD Endocrine:  No thryomegaly Vascular: No carotid bruits; FA pulses 2+ bilaterally without bruits  Cardiac:  normal S1, S2; RRR; no  murmur  Lungs:  clear to auscultation bilaterally, no wheezing, rhonchi or rales  Abd: soft, nontender, no hepatomegaly  Ext: no LE edema Musculoskeletal:  No deformities, BUE and BLE strength normal and equal Skin: warm and dry  Neuro:  CNs 2-12 intact, no focal abnormalities noted Psych:  Normal affect    EKG:  The ECG that was done  was personally reviewed and demonstrates sinus rhythm with RBBB, subtle lateral and anterolateral ST elevation  Relevant CV Studies:   Laboratory Data:  High Sensitivity Troponin:   Recent Labs  Lab 06/04/19 1445  TROPONINIHS 8      Cardiac EnzymesNo results for input(s): TROPONINI in the last 168 hours. No results for input(s): TROPIPOC in the last 168 hours.  Chemistry Recent Labs  Lab 06/04/19 1445  NA 139  K 3.9  CL 109  CO2 14*  GLUCOSE 158*  BUN 13  CREATININE 1.30*  CALCIUM 8.9  GFRNONAA 54*  GFRAA >60  ANIONGAP 16*    Recent Labs  Lab 06/04/19 1445  PROT 6.3*  ALBUMIN 3.8  AST 81*  ALT 72*  ALKPHOS 57  BILITOT 1.0   Hematology Recent Labs  Lab 06/04/19 1445  WBC 9.9  RBC 4.46  HGB 14.5  HCT 43.2  MCV 96.9  MCH 32.5  MCHC 33.6  RDW 13.0  PLT 153   BNPNo results for input(s): BNP, PROBNP in the last 168 hours.  DDimer No results for input(s): DDIMER in the last 168 hours.   Radiology/Studies:  Ct Head Wo Contrast  Result Date: 06/04/2019 CLINICAL DATA:  Cardiac arrest.  Fall. EXAM: CT HEAD WITHOUT CONTRAST CT MAXILLOFACIAL WITHOUT CONTRAST CT CERVICAL SPINE WITHOUT CONTRAST TECHNIQUE: Multidetector CT imaging of the head, cervical spine, and maxillofacial structures were performed using the standard protocol without intravenous contrast. Multiplanar CT image reconstructions of the cervical spine and maxillofacial structures were also  generated. COMPARISON:  None. FINDINGS: CT HEAD FINDINGS Brain: There is no evidence of acute infarct, intracranial hemorrhage, mass, midline shift, or extra-axial fluid collection. Mild cerebral atrophy is within normal limits for age. Cerebral white matter hypodensities are nonspecific but compatible with minimal chronic small vessel ischemic disease. Vascular: Calcified atherosclerosis at the skull base. Skull: No fracture or suspicious osseous lesion. Other: Left frontal scalp swelling. CT MAXILLOFACIAL FINDINGS Osseous: No acute fracture, mandibular dislocation, or suspicious osseous lesion. Orbits: Unremarkable. Sinuses: Extensive mucosal thickening throughout the paranasal sinuses bilaterally. Clear mastoid air cells. Soft tissues: Mild left frontal scalp swelling. CT CERVICAL SPINE FINDINGS Alignment: Trace anterolisthesis of C4 on C5 and C7 on T1, likely degenerative and facet mediated. Slight reversal of the normal lordosis in the lower cervical spine. Skull base and vertebrae: No acute fracture or suspicious osseous lesion. Moderate median C1-2 arthropathy. Soft tissues and spinal canal: No prevertebral fluid or swelling. No visible canal hematoma. Disc levels: Multilevel disc degeneration, most severe at C5-6 and C6-7. Mostly mild multilevel neural foraminal stenosis. Upper chest: Clear lung apices. Other: 8 mm calcified nodule in the left thyroid lobe. Mild calcified atherosclerosis about the carotid bifurcations. IMPRESSION: 1. No evidence of acute intracranial abnormality. 2. No acute maxillofacial or cervical spine fracture. These results were called by telephone at the time of interpretation on 06/04/2019 at 3:15 pm to Dr. Gareth Morgan, who verbally acknowledged these results. Electronically Signed   By: Logan Bores M.D.   On: 06/04/2019 16:02   Ct Cervical Spine Wo Contrast  Result Date: 06/04/2019 CLINICAL DATA:  Cardiac arrest.  Fall.  EXAM: CT HEAD WITHOUT CONTRAST CT MAXILLOFACIAL WITHOUT  CONTRAST CT CERVICAL SPINE WITHOUT CONTRAST TECHNIQUE: Multidetector CT imaging of the head, cervical spine, and maxillofacial structures were performed using the standard protocol without intravenous contrast. Multiplanar CT image reconstructions of the cervical spine and maxillofacial structures were also generated. COMPARISON:  None. FINDINGS: CT HEAD FINDINGS Brain: There is no evidence of acute infarct, intracranial hemorrhage, mass, midline shift, or extra-axial fluid collection. Mild cerebral atrophy is within normal limits for age. Cerebral white matter hypodensities are nonspecific but compatible with minimal chronic small vessel ischemic disease. Vascular: Calcified atherosclerosis at the skull base. Skull: No fracture or suspicious osseous lesion. Other: Left frontal scalp swelling. CT MAXILLOFACIAL FINDINGS Osseous: No acute fracture, mandibular dislocation, or suspicious osseous lesion. Orbits: Unremarkable. Sinuses: Extensive mucosal thickening throughout the paranasal sinuses bilaterally. Clear mastoid air cells. Soft tissues: Mild left frontal scalp swelling. CT CERVICAL SPINE FINDINGS Alignment: Trace anterolisthesis of C4 on C5 and C7 on T1, likely degenerative and facet mediated. Slight reversal of the normal lordosis in the lower cervical spine. Skull base and vertebrae: No acute fracture or suspicious osseous lesion. Moderate median C1-2 arthropathy. Soft tissues and spinal canal: No prevertebral fluid or swelling. No visible canal hematoma. Disc levels: Multilevel disc degeneration, most severe at C5-6 and C6-7. Mostly mild multilevel neural foraminal stenosis. Upper chest: Clear lung apices. Other: 8 mm calcified nodule in the left thyroid lobe. Mild calcified atherosclerosis about the carotid bifurcations. IMPRESSION: 1. No evidence of acute intracranial abnormality. 2. No acute maxillofacial or cervical spine fracture. These results were called by telephone at the time of interpretation on  06/04/2019 at 3:15 pm to Dr. Gareth Morgan, who verbally acknowledged these results. Electronically Signed   By: Logan Bores M.D.   On: 06/04/2019 16:02   Dg Chest Portable 1 View  Result Date: 06/04/2019 CLINICAL DATA:  Post arrest, CPR EXAM: PORTABLE CHEST 1 VIEW COMPARISON:  12/09/2017 FINDINGS: The heart size and mediastinal contours are within normal limits. Both lungs are clear. The visualized skeletal structures are unremarkable. IMPRESSION: No acute abnormality of the lungs in AP portable projection. Electronically Signed   By: Eddie Candle M.D.   On: 06/04/2019 15:23   Ct Maxillofacial Wo Cm  Result Date: 06/04/2019 CLINICAL DATA:  Cardiac arrest.  Fall. EXAM: CT HEAD WITHOUT CONTRAST CT MAXILLOFACIAL WITHOUT CONTRAST CT CERVICAL SPINE WITHOUT CONTRAST TECHNIQUE: Multidetector CT imaging of the head, cervical spine, and maxillofacial structures were performed using the standard protocol without intravenous contrast. Multiplanar CT image reconstructions of the cervical spine and maxillofacial structures were also generated. COMPARISON:  None. FINDINGS: CT HEAD FINDINGS Brain: There is no evidence of acute infarct, intracranial hemorrhage, mass, midline shift, or extra-axial fluid collection. Mild cerebral atrophy is within normal limits for age. Cerebral white matter hypodensities are nonspecific but compatible with minimal chronic small vessel ischemic disease. Vascular: Calcified atherosclerosis at the skull base. Skull: No fracture or suspicious osseous lesion. Other: Left frontal scalp swelling. CT MAXILLOFACIAL FINDINGS Osseous: No acute fracture, mandibular dislocation, or suspicious osseous lesion. Orbits: Unremarkable. Sinuses: Extensive mucosal thickening throughout the paranasal sinuses bilaterally. Clear mastoid air cells. Soft tissues: Mild left frontal scalp swelling. CT CERVICAL SPINE FINDINGS Alignment: Trace anterolisthesis of C4 on C5 and C7 on T1, likely degenerative and facet  mediated. Slight reversal of the normal lordosis in the lower cervical spine. Skull base and vertebrae: No acute fracture or suspicious osseous lesion. Moderate median C1-2 arthropathy. Soft tissues and spinal canal: No prevertebral  fluid or swelling. No visible canal hematoma. Disc levels: Multilevel disc degeneration, most severe at C5-6 and C6-7. Mostly mild multilevel neural foraminal stenosis. Upper chest: Clear lung apices. Other: 8 mm calcified nodule in the left thyroid lobe. Mild calcified atherosclerosis about the carotid bifurcations. IMPRESSION: 1. No evidence of acute intracranial abnormality. 2. No acute maxillofacial or cervical spine fracture. These results were called by telephone at the time of interpretation on 06/04/2019 at 3:15 pm to Dr. Gareth Morgan, who verbally acknowledged these results. Electronically Signed   By: Logan Bores M.D.   On: 06/04/2019 16:02    Assessment and Plan:   1. Cardiac arrest/CAD/NSTEMI: Pt admitted post cardiac arrest/syncope. Cardiac cath with severe disease in the ostium of the LAD which arises just beyond the large intermediate branch. There is also severe disease in the mid segment of the intermediate branch and in the mid segment of the large, dominant Circumflex. CABG is indicated given characteristics of the LAD lesion affecting the other large branches due to ostial location. My initial plan was to balloon the LAD lesion only but I was unable to pass a wire into the LAD given the unfavorable angle. The LAD had TIMI-3 flow. I did not perform balloon angioplasty. Cangrelor was started in anticipation of PCI as he was unable to swallow pills. -Admit to ICU -Echo in am -ASA 81 mg daily -Hold Plavix in anticipation of CABG later this week -Continue statin and beta blocker -Will infuse Cangrelor for 6 hours and if no bleeding from cath site, will then start IV heparin.  -Consult CT surgery for CABG. I will call tonight but anticipate full consult  tomorrow unless he becomes unstable overnight.   2. HTN: BP stable  3. HLD: High intensity statin.   Severity of Illness: The appropriate patient status for this patient is INPATIENT. Inpatient status is judged to be reasonable and necessary in order to provide the required intensity of service to ensure the patient's safety. The patient's presenting symptoms, physical exam findings, and initial radiographic and laboratory data in the context of their chronic comorbidities is felt to place them at high risk for further clinical deterioration. Furthermore, it is not anticipated that the patient will be medically stable for discharge from the hospital within 2 midnights of admission. The following factors support the patient status of inpatient.   " The patient's presenting symptoms include cardiac arrest. " The worrisome physical exam findings include abnormal EKG " The initial radiographic and laboratory data are worrisome because of cardiac cath with severe CAD " The chronic co-morbidities include CAD, HTN, HLD   * I certify that at the point of admission it is my clinical judgment that the patient will require inpatient hospital care spanning beyond 2 midnights from the point of admission due to high intensity of service, high risk for further deterioration and high frequency of surveillance required.*    For questions or updates, please contact [place] Please consult [email or web address] for contact info under        Signed, Lauree Chandler, MD  06/04/2019 4:44 PM

## 2019-06-04 NOTE — Consult Note (Signed)
[address]       [place] [address]             [phone]        Holton K Silberman Vincent Medical Record #[id number] Date of Birth: 1946/03/11  Referring: Primary Lauretta Grill  Care: Lorene Dy, MD Primary Cardiologist:Philip Nahser, MD  Chief Complaint:    Chief Complaint  Patient presents with   Cardiac Arrest  Patient examined, images of coronary angiograms personally reviewed and discussed with Dr. Angelena Form for coordination of care  History of Present Illness:     73 year old nondiabetic non-smoker collapsed with unconscious fall at work.  CPR initiated by coworkers with intermittent return of consciousness.  When EMS arrived patient was conscious with pulse.  Was evaluated in the ED with CT scans of head and neck due to fall on his face-negative for fracture.  Patient has past history of PCI of the ramus intermediate in 2009 by Dr. Acie Fredrickson.  No symptoms recurrent angina up until now.  Emergency cardiac catheterization demonstrated 99% ostial LAD stenosis involving the ostium of the ramus.  There is a proximal mid 80%  stenosis of a dominant circumflex.  LV function appears to be fairly well-preserved.  The patient has been on Plavix for stents since 2009.  He will be started on a heparin following cath and Plavix held in preparation for multivessel CABG which has been recommended by the patient's cardiologist.  I have discussed the plan for CABG during this admission after Plavix washout with the patient and he understands and agrees although he does have some memory loss surrounding the events of earlier today.   Current Activity/ Functional Status: Very active works full-time Took up boxing to help his conditioning Zubrod Score: At the time of surgery this patients most appropriate activity status/level should be described as: []     0    Normal activity, no symptoms [x]     1    Restricted in physical strenuous activity but ambulatory, able  to do out light work []     2    Ambulatory and capable of self care, unable to do work activities, up and about                 more than 50%  Of the time                            []     3    Only limited self care, in bed greater than 50% of waking hours []     4    Completely disabled, no self care, confined to bed or chair []     5    Moribund  Past Medical History:  Diagnosis Date   Arthritis    OA AND PAIN BOTH KNEES   Asthma    AS A CHILD   Chronic knee pain    Coronary artery disease    PTCA/stenting ramus inter. vess.; 10/2008   GERD (gastroesophageal reflux disease)    MILD--WOULD TAKE ROLAID IF NEEDED   Hernia    "AT MY BELLY BUTTON" - NO PAIN OR PROBLEMS   Hyperlipidemia    Hypertension    Restless leg syndrome    Seasonal allergies    FREQENT BRONCHITIS. SINUS INFECTIONS WITH SEASON CHANGES   Shingles DEC 2012   MILD CASE-NO RESIDUAL PROBLEMS   Sleep apnea 07/06/12   STOP BANG SCORE OF 4  Past Surgical History:  Procedure Laterality Date   CARDIAC CATHETERIZATION     10/30/2008;  PTCA/stent ramus interm. vess.   CORONARY ANGIOPLASTY     JOINT REPLACEMENT     KNEE SURGERY     right and left   TOTAL KNEE ARTHROPLASTY  07/26/2012   Procedure: TOTAL KNEE ARTHROPLASTY;  Surgeon: Mauri Pole, MD;  Location: [facility];  Service: Orthopedics;  Laterality: Left;   TOTAL KNEE ARTHROPLASTY  10/25/2012   Procedure: TOTAL KNEE ARTHROPLASTY;  Surgeon: Mauri Pole, MD;  Location: [facility];  Service: Orthopedics;  Laterality: Right;    Social History   Tobacco Use  Smoking Status Never Smoker  Smokeless Tobacco Former Systems developer    Social History   Substance and Sexual Activity  Alcohol Use Yes   Comment: occ     Allergies  Allergen Reactions   Clarithromycin Other (See Comments)    Reaction=dizziness    Current Facility-Administered Medications  Medication Dose Route Frequency Provider Last Rate Last Dose   0.9 %  sodium chloride infusion    Intravenous Continuous Little, Wenda Overland, MD       0.9 %  sodium chloride infusion   Intravenous Continuous Burnell Blanks, MD 75 mL/hr at 06/04/19 1700     0.9 %  sodium chloride infusion  250 mL Intravenous PRN Burnell Blanks, MD       acetaminophen (TYLENOL) tablet 650 mg  650 mg Oral Q4H PRN Burnell Blanks, MD       [START ON 06/05/2019] aspirin EC tablet 81 mg  81 mg Oral q morning - 10a Burnell Blanks, MD       atorvastatin (LIPITOR) tablet 80 mg  80 mg Oral Daily Burnell Blanks, MD       heparin ADULT infusion 100 units/mL (25000 units/263mL sodium chloride 0.45%)  1,100 Units/hr Intravenous Continuous Burnell Blanks, MD       hydrALAZINE (APRESOLINE) injection 10 mg  10 mg Intravenous Q20 Min PRN Burnell Blanks, MD       labetalol (NORMODYNE) injection 10 mg  10 mg Intravenous Q10 min PRN Burnell Blanks, MD       metoprolol tartrate (LOPRESSOR) tablet 25 mg  25 mg Oral BID Burnell Blanks, MD       nitroGLYCERIN (NITROSTAT) SL tablet 0.4 mg  0.4 mg Sublingual Q5 min PRN Burnell Blanks, MD       ondansetron [facility]) injection 4 mg  4 mg Intravenous Q6H PRN Burnell Blanks, MD       sodium chloride flush (NS) 0.9 % injection 3 mL  3 mL Intravenous Q12H Burnell Blanks, MD       sodium chloride flush (NS) 0.9 % injection 3 mL  3 mL Intravenous PRN Burnell Blanks, MD        Medications Prior to Admission  Medication Sig Dispense Refill Last Dose   aspirin EC 81 MG tablet Take 81 mg by mouth every morning.   06/04/2019 at 0930   atorvastatin (LIPITOR) 40 MG tablet TAKE 1 TABLET BY MOUTH EVERY DAY (Patient taking differently: Take 40 mg by mouth daily. ) 90 tablet 2 06/03/2019 at Unknown time   B COMPLEX-C-E PO Take 1 tablet by mouth daily.   Past Week at Unknown time   clopidogrel (PLAVIX) 75 MG tablet TAKE 1 TABLET (75 MG TOTAL) BY MOUTH EVERY MORNING. 90 tablet  0 06/04/2019 at 0930   diphenhydrAMINE (BENADRYL) 25 MG  tablet Take 25 mg by mouth every morning.   06/04/2019 at Unknown time   famotidine (PEPCID) 20 MG tablet Take 20 mg by mouth daily.   06/04/2019 at Unknown time   Fluticasone Propionate (FLONASE NA) Place 1 spray into the nose as needed (allergies). For sinus infection   06/03/2019 at Unknown time   losartan (COZAAR) 25 MG tablet Take 25 mg by mouth daily.   06/04/2019 at Unknown time   metoprolol tartrate (LOPRESSOR) 25 MG tablet TAKE 1 TABLET BY MOUTH TWICE A DAY (Patient taking differently: Take 25 mg by mouth 2 (two) times daily. ) 180 tablet 1 06/04/2019 at 0930   nitroGLYCERIN (NITROSTAT) 0.4 MG SL tablet Place 1 tablet (0.4 mg total) under the tongue every 5 (five) minutes as needed. For chest pain 25 tablet 1 unknown   vitamin B-12 (CYANOCOBALAMIN) 1000 MCG tablet Take 1,000 mcg by mouth daily.   06/04/2019 at Unknown time   zinc gluconate 50 MG tablet Take 25 mg by mouth daily.    06/04/2019 at Unknown time    Family History  Problem Relation Age of Onset   COPD Father      Review of Systems:   ROS History of bilateral total knee replacements without anesthesia or bleeding problems Right-hand-dominant No history of thoracic trauma or rib fractures or pneumothorax    Cardiac Review of Systems: Y or  [    ]= no  Chest Pain [    ]  Resting SOB [   ] Exertional SOB  [  ]  Orthopnea [  ]   Pedal Edema [   ]    Palpitations [  ] Syncope  [ y ]   Presyncope [   ]  General Review of Systems: [Y] = yes [  ]=no Constitional: recent weight change [  ]; anorexia [  ]; fatigue [  ]; nausea [  ]; night sweats [  ]; fever [  ]; or chills [  ]                                                               Dental: Last Dentist visit: 1 year  Eye : blurred vision [  ]; diplopia [   ]; vision changes [  ];  Amaurosis fugax[  ]; Resp: cough [  ];  wheezing[  ];  hemoptysis[  ]; shortness of breath[  ]; paroxysmal nocturnal dyspnea[  ];  dyspnea on exertion[  ]; or orthopnea[  ];  GI:  gallstones[  ], vomiting[  ];  dysphagia[  ]; melena[  ];  hematochezia [  ]; heartburn[  ];   Hx of  Colonoscopy[  ]; GU: kidney stones [  ]; hematuria[  ];   dysuria [  ];  nocturia[ y ];  history of     obstruction [  ]; urinary frequency [  ]             Skin: rash, swelling[  ];, hair loss[  ];  peripheral edema[  ];  or itching[  ]; Musculosketetal: myalgias[  ];  joint swelling[  ];  joint erythema[  ];  joint pain[ y ];  back pain[  ];  Heme/Lymph: bruising[  ];  bleeding[  ];  anemia[  ];  Neuro: TIA[  ];  headaches[  ];  stroke[  ];  vertigo[  ];  seizures[  ];   paresthesias[  ];  difficulty walking[  ];  Psych:depression[  ]; anxiety[  ];  Endocrine: diabetes[  ];  thyroid dysfunction[  ];               Physical Exam: BP (!) 144/93    Pulse 90    Temp (!) 97.4 F (36.3 C) (Oral)    Resp 17    Ht 6\' 3"  (1.905 m)    Wt 92.1 kg    SpO2 100%    BMI 25.37 kg/m         Exam    General- alert and comfortable    Neck- no JVD, no cervical adenopathy palpable, no carotid bruit   Lungs- clear without rales, wheezes   Cor- regular rate and rhythm, no murmur , gallop   Abdomen- soft, non-tender   Extremities - warm, non-tender, minimal edema.  No bleeding from right arm cath site   Neuro- oriented, appropriate, no focal weakness   Diagnostic Studies & Laboratory data:     Recent Radiology Findings:   Ct Head Wo Contrast  Result Date: 06/04/2019 CLINICAL DATA:  Cardiac arrest.  Fall. EXAM: CT HEAD WITHOUT CONTRAST CT MAXILLOFACIAL WITHOUT CONTRAST CT CERVICAL SPINE WITHOUT CONTRAST TECHNIQUE: Multidetector CT imaging of the head, cervical spine, and maxillofacial structures were performed using the standard protocol without intravenous contrast. Multiplanar CT image reconstructions of the cervical spine and maxillofacial structures were also generated. COMPARISON:  None. FINDINGS: CT HEAD FINDINGS Brain: There is no evidence of acute  infarct, intracranial hemorrhage, mass, midline shift, or extra-axial fluid collection. Mild cerebral atrophy is within normal limits for age. Cerebral white matter hypodensities are nonspecific but compatible with minimal chronic small vessel ischemic disease. Vascular: Calcified atherosclerosis at the skull base. Skull: No fracture or suspicious osseous lesion. Other: Left frontal scalp swelling. CT MAXILLOFACIAL FINDINGS Osseous: No acute fracture, mandibular dislocation, or suspicious osseous lesion. Orbits: Unremarkable. Sinuses: Extensive mucosal thickening throughout the paranasal sinuses bilaterally. Clear mastoid air cells. Soft tissues: Mild left frontal scalp swelling. CT CERVICAL SPINE FINDINGS Alignment: Trace anterolisthesis of C4 on C5 and C7 on T1, likely degenerative and facet mediated. Slight reversal of the normal lordosis in the lower cervical spine. Skull base and vertebrae: No acute fracture or suspicious osseous lesion. Moderate median C1-2 arthropathy. Soft tissues and spinal canal: No prevertebral fluid or swelling. No visible canal hematoma. Disc levels: Multilevel disc degeneration, most severe at C5-6 and C6-7. Mostly mild multilevel neural foraminal stenosis. Upper chest: Clear lung apices. Other: 8 mm calcified nodule in the left thyroid lobe. Mild calcified atherosclerosis about the carotid bifurcations. IMPRESSION: 1. No evidence of acute intracranial abnormality. 2. No acute maxillofacial or cervical spine fracture. These results were called by telephone at the time of interpretation on 06/04/2019 at 3:15 pm to Dr. Gareth Morgan, who verbally acknowledged these results. Electronically Signed   By: Logan Bores M.D.   On: 06/04/2019 16:02   Ct Cervical Spine Wo Contrast  Result Date: 06/04/2019 CLINICAL DATA:  Cardiac arrest.  Fall. EXAM: CT HEAD WITHOUT CONTRAST CT MAXILLOFACIAL WITHOUT CONTRAST CT CERVICAL SPINE WITHOUT CONTRAST TECHNIQUE: Multidetector CT imaging of the head,  cervical spine, and maxillofacial structures were performed using the standard protocol without intravenous contrast. Multiplanar CT image reconstructions of the cervical spine and maxillofacial structures were also generated. COMPARISON:  None. FINDINGS: CT HEAD FINDINGS Brain:  There is no evidence of acute infarct, intracranial hemorrhage, mass, midline shift, or extra-axial fluid collection. Mild cerebral atrophy is within normal limits for age. Cerebral white matter hypodensities are nonspecific but compatible with minimal chronic small vessel ischemic disease. Vascular: Calcified atherosclerosis at the skull base. Skull: No fracture or suspicious osseous lesion. Other: Left frontal scalp swelling. CT MAXILLOFACIAL FINDINGS Osseous: No acute fracture, mandibular dislocation, or suspicious osseous lesion. Orbits: Unremarkable. Sinuses: Extensive mucosal thickening throughout the paranasal sinuses bilaterally. Clear mastoid air cells. Soft tissues: Mild left frontal scalp swelling. CT CERVICAL SPINE FINDINGS Alignment: Trace anterolisthesis of C4 on C5 and C7 on T1, likely degenerative and facet mediated. Slight reversal of the normal lordosis in the lower cervical spine. Skull base and vertebrae: No acute fracture or suspicious osseous lesion. Moderate median C1-2 arthropathy. Soft tissues and spinal canal: No prevertebral fluid or swelling. No visible canal hematoma. Disc levels: Multilevel disc degeneration, most severe at C5-6 and C6-7. Mostly mild multilevel neural foraminal stenosis. Upper chest: Clear lung apices. Other: 8 mm calcified nodule in the left thyroid lobe. Mild calcified atherosclerosis about the carotid bifurcations. IMPRESSION: 1. No evidence of acute intracranial abnormality. 2. No acute maxillofacial or cervical spine fracture. These results were called by telephone at the time of interpretation on 06/04/2019 at 3:15 pm to Dr. Gareth Morgan, who verbally acknowledged these results.  Electronically Signed   By: Logan Bores M.D.   On: 06/04/2019 16:02   Dg Chest Portable 1 View  Result Date: 06/04/2019 CLINICAL DATA:  Post arrest, CPR EXAM: PORTABLE CHEST 1 VIEW COMPARISON:  12/09/2017 FINDINGS: The heart size and mediastinal contours are within normal limits. Both lungs are clear. The visualized skeletal structures are unremarkable. IMPRESSION: No acute abnormality of the lungs in AP portable projection. Electronically Signed   By: Eddie Candle M.D.   On: 06/04/2019 15:23   Ct Maxillofacial Wo Cm  Result Date: 06/04/2019 CLINICAL DATA:  Cardiac arrest.  Fall. EXAM: CT HEAD WITHOUT CONTRAST CT MAXILLOFACIAL WITHOUT CONTRAST CT CERVICAL SPINE WITHOUT CONTRAST TECHNIQUE: Multidetector CT imaging of the head, cervical spine, and maxillofacial structures were performed using the standard protocol without intravenous contrast. Multiplanar CT image reconstructions of the cervical spine and maxillofacial structures were also generated. COMPARISON:  None. FINDINGS: CT HEAD FINDINGS Brain: There is no evidence of acute infarct, intracranial hemorrhage, mass, midline shift, or extra-axial fluid collection. Mild cerebral atrophy is within normal limits for age. Cerebral white matter hypodensities are nonspecific but compatible with minimal chronic small vessel ischemic disease. Vascular: Calcified atherosclerosis at the skull base. Skull: No fracture or suspicious osseous lesion. Other: Left frontal scalp swelling. CT MAXILLOFACIAL FINDINGS Osseous: No acute fracture, mandibular dislocation, or suspicious osseous lesion. Orbits: Unremarkable. Sinuses: Extensive mucosal thickening throughout the paranasal sinuses bilaterally. Clear mastoid air cells. Soft tissues: Mild left frontal scalp swelling. CT CERVICAL SPINE FINDINGS Alignment: Trace anterolisthesis of C4 on C5 and C7 on T1, likely degenerative and facet mediated. Slight reversal of the normal lordosis in the lower cervical spine. Skull base  and vertebrae: No acute fracture or suspicious osseous lesion. Moderate median C1-2 arthropathy. Soft tissues and spinal canal: No prevertebral fluid or swelling. No visible canal hematoma. Disc levels: Multilevel disc degeneration, most severe at C5-6 and C6-7. Mostly mild multilevel neural foraminal stenosis. Upper chest: Clear lung apices. Other: 8 mm calcified nodule in the left thyroid lobe. Mild calcified atherosclerosis about the carotid bifurcations. IMPRESSION: 1. No evidence of acute intracranial abnormality. 2. No acute maxillofacial  or cervical spine fracture. These results were called by telephone at the time of interpretation on 06/04/2019 at 3:15 pm to Dr. Gareth Morgan, who verbally acknowledged these results. Electronically Signed   By: Logan Bores M.D.   On: 06/04/2019 16:02     I have independently reviewed the above radiologic studies and discussed with the patient   Recent Lab Findings: Lab Results  Component Value Date   WBC 9.9 06/04/2019   HGB 14.5 06/04/2019   HCT 43.2 06/04/2019   PLT 153 06/04/2019   GLUCOSE 158 (H) 06/04/2019   CHOL 140 06/04/2019   TRIG 114 06/04/2019   HDL 56 06/04/2019   LDLCALC 61 06/04/2019   ALT 72 (H) 06/04/2019   AST 81 (H) 06/04/2019   NA 139 06/04/2019   K 3.9 06/04/2019   CL 109 06/04/2019   CREATININE 1.30 (H) 06/04/2019   BUN 13 06/04/2019   CO2 14 (L) 06/04/2019   INR 1.2 06/04/2019      Assessment / Plan:   Syncope associated with cardiac arrest and return of consciousness and pulse with CPR Severe three-vessel coronary disease with 99% ostial LAD stenosis History of PCI of the ramus intermediate 2009 Dyslipidemia   Plan Plavix washout and multivessel CABG later this week, probably July 2  @ME1 @ 06/04/2019 7:03 PM

## 2019-06-04 NOTE — ED Provider Notes (Addendum)
[facility] EMERGENCY DEPARTMENT Provider Note   CSN: [id number] Arrival date & time: 06/04/19  1443     History   Chief Complaint Chief Complaint  Patient presents with  . Cardiac Arrest    HPI [name] is a 73 y.o. male.     73yo M w/ PMH including CAD, OSA, HTN, HLD who p/w cardiac arrest. Pt was reportedly at work carrying boxes when he had a witnessed collapse and fell forward. Bystanders called EMS and started CPR. Twice he had ROSC followed by CPR. On EMS arrival, he had pulse and was breathing spontaneously. He has become more alert during transport.   LEVEL 5 CAVEAT DUE TO AMS  The history is provided by the EMS personnel.  Cardiac Arrest   Past Medical History:  Diagnosis Date  . Arthritis    OA AND PAIN BOTH KNEES  . Asthma    AS A CHILD  . Chronic knee pain   . Coronary artery disease    PTCA/stenting ramus inter. vess.; 10/2008  . GERD (gastroesophageal reflux disease)    MILD--WOULD TAKE ROLAID IF NEEDED  . Hernia    "AT MY BELLY BUTTON" - NO PAIN OR PROBLEMS  . Hyperlipidemia   . Hypertension   . Restless leg syndrome   . Seasonal allergies    FREQENT BRONCHITIS. SINUS INFECTIONS WITH SEASON CHANGES  . Shingles DEC 2012   MILD CASE-NO RESIDUAL PROBLEMS  . Sleep apnea 07/06/12   STOP BANG SCORE OF 4    Patient Active Problem List   Diagnosis Date Noted  . 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 CAD (coronary artery disease) 03/31/2011  . Hyperlipemia 03/31/2011    Past Surgical History:  Procedure Laterality Date  . CARDIAC CATHETERIZATION     10/30/2008;  PTCA/stent ramus interm. vess.  . CORONARY ANGIOPLASTY    . JOINT REPLACEMENT    . KNEE SURGERY     right and left  . TOTAL KNEE ARTHROPLASTY  07/26/2012   Procedure: TOTAL KNEE ARTHROPLASTY;  Surgeon: Mauri Pole, MD;  Location: [facility];  Service: Orthopedics;  Laterality: Left;  . TOTAL KNEE ARTHROPLASTY  10/25/2012   Procedure: TOTAL KNEE ARTHROPLASTY;  Surgeon:  Mauri Pole, MD;  Location: [facility];  Service: Orthopedics;  Laterality: Right;        Home Medications    Prior to Admission medications   Medication Sig Start Date End Date Taking? Authorizing Provider  aspirin EC 81 MG tablet Take 81 mg by mouth every morning.    [provider]  atorvastatin (LIPITOR) 40 MG tablet TAKE 1 TABLET BY MOUTH EVERY DAY 04/11/19   Nahser, Wonda Cheng, MD  B COMPLEX-C-E PO Take 1 tablet by mouth daily.    [provider]  clopidogrel (PLAVIX) 75 MG tablet TAKE 1 TABLET (75 MG TOTAL) BY MOUTH EVERY MORNING. 04/11/19   Nahser, Wonda Cheng, MD  diphenhydrAMINE (BENADRYL) 25 MG tablet Take 25 mg by mouth every morning.    [provider]  Fluticasone Propionate (FLONASE NA) Place 2 sprays into the nose as needed. For sinus infection    [provider]  lisinopril (PRINIVIL,ZESTRIL) 2.5 MG tablet Take 2.5 mg by mouth daily.    [provider]  metoprolol tartrate (LOPRESSOR) 25 MG tablet TAKE 1 TABLET BY MOUTH TWICE A DAY 03/13/19   Nahser, Wonda Cheng, MD  nitroGLYCERIN (NITROSTAT) 0.4 MG SL tablet Place 1 tablet (0.4 mg total) under the tongue every 5 (five)  minutes as needed. For chest pain 01/27/17   Nahser, Wonda Cheng, MD  vitamin B-12 (CYANOCOBALAMIN) 1000 MCG tablet Take 1,000 mcg by mouth daily.    [provider]  zinc gluconate 50 MG tablet Take 50 mg by mouth daily.    [provider]    Family History Family History  Problem Relation Age of Onset  . COPD Father     Social History Social History   Tobacco Use  . Smoking status: Never Smoker  . Smokeless tobacco: Former Network engineer Use Topics  . Alcohol use: Yes    Comment: occ  . Drug use: No     Allergies   Clarithromycin   Review of Systems Review of Systems  Unable to perform ROS: Mental status change     Physical Exam Updated Vital Signs BP (!) 163/105   Pulse (!) 113   Resp 18   Ht 6\' 3"  (1.905 m)   Wt 92.1 kg   SpO2  100%   BMI 25.37 kg/m   Physical Exam Vitals signs and nursing note reviewed.  Constitutional:      General: He is not in acute distress.    Appearance: He is well-developed.     Comments: confused  HENT:     Head: Normocephalic.     Comments: Dried blood b/l nares Eyes:     Conjunctiva/sclera: Conjunctivae normal.     Pupils: Pupils are equal, round, and reactive to light.  Neck:     Comments: In c-collar Cardiovascular:     Rate and Rhythm: Regular rhythm. Tachycardia present.     Heart sounds: Normal heart sounds. No murmur.  Pulmonary:     Breath sounds: Normal breath sounds.     Comments: tachypnea Abdominal:     General: Bowel sounds are normal. There is no distension.     Palpations: Abdomen is soft.     Tenderness: There is no abdominal tenderness.  Musculoskeletal:        General: No deformity.  Skin:    General: Skin is warm and dry.  Neurological:     Mental Status: He is alert.     Comments: Oriented to person only, confused  Psychiatric:     Comments: anxious      ED Treatments / Results  Labs (all labs ordered are listed, but only abnormal results are displayed) Labs Reviewed  SARS CORONAVIRUS 2 ([facility])  CBC WITH DIFFERENTIAL/PLATELET  PROTIME-INR  APTT  COMPREHENSIVE METABOLIC PANEL  TROPONIN I (HIGH SENSITIVITY)  TROPONIN I (HIGH SENSITIVITY)  LIPID PANEL    EKG EKG Interpretation  Date/Time:  Sunday June 04 2019 14:46:14 EDT Ventricular Rate:  111 PR Interval:    QRS Duration: 149 QT Interval:  361 QTC Calculation: 491 R Axis:   76 Text Interpretation:  Sinus tachycardia Right bundle branch block Lateral infarct, acute >>> Acute MI <<< new RBBB, and ischemic changes new from previous Confirmed by Theotis Burrow [phone]) on 06/04/2019 2:51:54 PM   Radiology No results found.  Procedures .Critical Care Performed by: Sharlett Iles, MD Authorized by: Sharlett Iles, MD    Critical care provider statement:    Critical care time (minutes):  30   Critical care time was exclusive of:  Separately billable procedures and treating other patients   Critical care was necessary to treat or prevent imminent or life-threatening deterioration of the following conditions:  Cardiac failure   Critical care was time spent  personally by me on the following activities:  Development of treatment plan with patient or surrogate, evaluation of patient's response to treatment, examination of patient, obtaining history from patient or surrogate, ordering and performing treatments and interventions, ordering and review of laboratory studies, ordering and review of radiographic studies, re-evaluation of patient's condition and review of old charts   (including critical care time)  Medications Ordered in ED Medications  0.9 %  sodium chloride infusion (has no administration in time range)     Initial Impression / Assessment and Plan / ED Course  I have reviewed the triage vital signs and the nursing notes.  Pertinent labs & imaging results that were available during my care of the patient were reviewed by me and considered in my medical decision making (see chart for details).       PT arrived awake, breathing spontaneously, hypertensive, mild tachycardia. Stating "I can't breathe, I can't breathe" however O2 sat 100% and normal air movement. Code STEMI had been called in route.  Today CT of head, cervical spine, and face in order to rule out acute injury prior to going to Cath Lab.  I have signed the patient out to the oncoming provider pending completion of work-up and cardiology.  Final Clinical Impressions(s) / ED Diagnoses   Final diagnoses:  None    ED Discharge Orders    None       Little, Wenda Overland, MD 06/04/19 1504    Little, Wenda Overland, MD 06/04/19 [phone]

## 2019-06-04 NOTE — ED Triage Notes (Signed)
Per [facility] pt arrives after falling face down and cardiac arresting at work. Report initiating CPR immediately and obtaining ROSC twice before EMS arrived on scene. EMS administered some breath through BVM. EMS paged out STEMI Patient arrives alert and calling out difficulty breathing. No meds given PTA. Patient hit head on ground, has blood in and around nose. Non rebreather in place.

## 2019-06-04 NOTE — ED Notes (Signed)
Pt wife Caren Griffins [phone]

## 2019-06-04 NOTE — Progress Notes (Addendum)
ANTICOAGULATION CONSULT NOTE - Initial Consult  Pharmacy Consult for heparin Indication: chest pain/ACS  Allergies  Allergen Reactions  . Clarithromycin Other (See Comments)    Reaction=dizziness    Patient Measurements: Height: 6\' 3"  (190.5 cm) Weight: 203 lb (92.1 kg) IBW/kg (Calculated) : 84.5 Heparin Dosing Weight: 92.1kg  Vital Signs: Temp: 97.4 F (36.3 C) (06/28 1515) Temp Source: Oral (06/28 1515) BP: 138/93 (06/28 1609) Pulse Rate: 93 (06/28 1609)  Labs: Recent Labs    06/04/19 1445  HGB 14.5  HCT 43.2  PLT 153  APTT 27  LABPROT 14.6  INR 1.2  CREATININE 1.30*  TROPONINIHS 8    Estimated Creatinine Clearance: 59.6 mL/min (A) (by C-G formula based on SCr of 1.3 mg/dL (H)).   Medical History: Past Medical History:  Diagnosis Date  . Arthritis    OA AND PAIN BOTH KNEES  . Asthma    AS A CHILD  . Chronic knee pain   . Coronary artery disease    PTCA/stenting ramus inter. vess.; 10/2008  . GERD (gastroesophageal reflux disease)    MILD--WOULD TAKE ROLAID IF NEEDED  . Hernia    "AT MY BELLY BUTTON" - NO PAIN OR PROBLEMS  . Hyperlipidemia   . Hypertension   . Restless leg syndrome   . Seasonal allergies    FREQENT BRONCHITIS. SINUS INFECTIONS WITH SEASON CHANGES  . Shingles DEC 2012   MILD CASE-NO RESIDUAL PROBLEMS  . Sleep apnea 07/06/12   STOP BANG SCORE OF 4    Medications:  Infusions:  . sodium chloride    . sodium chloride    . sodium chloride    . cangrelor 50 mg in NS 250 mL    . heparin      Assessment: 86 yom presented to the ED as a Code STEMI s/p cardiac arrest. Pt proceeded to cardiac cath where he was found to have severe CAD. He may require CABG. He is currently on cangrelor as he was unable to take PO. This was changed over to IV heparin tonight at 2200. He is not on anticoagulation PTA aside from plavix and aspirin. Baseline CBC is WNL.   Goal of Therapy:  Heparin level 0.3-0.7 units/ml Monitor platelets by  anticoagulation protocol: Yes   Plan:  Heparin gtt 1100 units/hr starting at 10pm if there is no bleeding from the cath site Check an 8 hr heparin level Daily heparin level and CBC F/u surgery plans  Sidnie Swalley, Rande Lawman 06/04/2019,4:45 PM  Addendum: RN called to say that patient is having some increased bleeding from his face. Discussed with Dr. Angelena Form and will hold cangrelor and plan to start heparin 4 hours post-sheath removal at 8pm. Will need to monitor closely for further bleeding. Discussed with RN.   Salome Arnt, PharmD, BCPS Please see [facility] for all pharmacy numbers 06/04/2019 5:53 PM

## 2019-06-04 NOTE — ED Notes (Signed)
Patient transported to [facility] by RN and tech.

## 2019-06-05 ENCOUNTER — Other Ambulatory Visit: Payer: Self-pay | Admitting: *Deleted

## 2019-06-05 ENCOUNTER — Other Ambulatory Visit (HOSPITAL_COMMUNITY): Payer: PPO

## 2019-06-05 ENCOUNTER — Encounter (HOSPITAL_COMMUNITY): Payer: Self-pay | Admitting: Cardiovascular Disease

## 2019-06-05 ENCOUNTER — Inpatient Hospital Stay (HOSPITAL_COMMUNITY): Payer: PPO

## 2019-06-05 DIAGNOSIS — I2510 Atherosclerotic heart disease of native coronary artery without angina pectoris: Secondary | ICD-10-CM

## 2019-06-05 LAB — BASIC METABOLIC PANEL WITH GFR
Anion gap: 7 (ref 5–15)
BUN: 12 mg/dL (ref 8–23)
CO2: 22 mmol/L (ref 22–32)
Calcium: 8.8 mg/dL — ABNORMAL LOW (ref 8.9–10.3)
Chloride: 111 mmol/L (ref 98–111)
Creatinine, Ser: 1.17 mg/dL (ref 0.61–1.24)
GFR calc Af Amer: >60 mL/min
GFR calc non Af Amer: >60 mL/min
Glucose, Bld: 113 mg/dL — ABNORMAL HIGH (ref 70–99)
Potassium: 3.9 mmol/L (ref 3.5–5.1)
Sodium: 140 mmol/L (ref 135–145)

## 2019-06-05 LAB — ECHOCARDIOGRAM COMPLETE
Height: 75 in
Weight: 3248 oz

## 2019-06-05 LAB — CBC
HCT: 38.6 % — ABNORMAL LOW (ref 39.0–52.0)
Hemoglobin: 13.4 g/dL (ref 13.0–17.0)
MCH: 33.2 pg (ref 26.0–34.0)
MCHC: 34.7 g/dL (ref 30.0–36.0)
MCV: 95.5 fL (ref 80.0–100.0)
Platelets: 133 K/uL — ABNORMAL LOW (ref 150–400)
RBC: 4.04 MIL/uL — ABNORMAL LOW (ref 4.22–5.81)
RDW: 13.2 % (ref 11.5–15.5)
WBC: 9.9 K/uL (ref 4.0–10.5)
nRBC: 0 % (ref 0.0–0.2)

## 2019-06-05 LAB — PLATELET INHIBITION P2Y12: Platelet Function  P2Y12: 212 [PRU] (ref 182–335)

## 2019-06-05 LAB — HEMOGLOBIN A1C
Hgb A1c MFr Bld: 5.6 % (ref 4.8–5.6)
Mean Plasma Glucose: 114.02 mg/dL

## 2019-06-05 LAB — HEPARIN LEVEL (UNFRACTIONATED)
Heparin Unfractionated: 0.35 [IU]/mL (ref 0.30–0.70)
Heparin Unfractionated: 0.23 [IU]/mL — ABNORMAL LOW (ref 0.30–0.70)
Heparin Unfractionated: 0.53 IU/mL (ref 0.30–0.70)

## 2019-06-05 LAB — TROPONIN I (HIGH SENSITIVITY): Troponin I (High Sensitivity): 1003 ng/L (ref <18)

## 2019-06-05 LAB — PROTIME-INR
INR: 1.2 (ref 0.8–1.2)
Prothrombin Time: 15.2 s (ref 11.4–15.2)

## 2019-06-05 LAB — POCT ACTIVATED CLOTTING TIME: Activated Clotting Time: 296 seconds

## 2019-06-05 LAB — TSH: TSH: 2.029 u[IU]/mL (ref 0.350–4.500)

## 2019-06-05 MED FILL — Heparin Sod (Porcine)-NaCl IV Soln 1000 Unit/500ML-0.9%: INTRAVENOUS | Qty: 1000 | Status: AC

## 2019-06-05 NOTE — Progress Notes (Addendum)
ANTICOAGULATION CONSULT NOTE - Initial Consult  Pharmacy Consult for heparin Indication: chest pain/ACS  Allergies  Allergen Reactions  . Clarithromycin Other (See Comments)    Reaction=dizziness    Patient Measurements: Height: 6\' 3"  (190.5 cm) Weight: 203 lb (92.1 kg) IBW/kg (Calculated) : 84.5 HEPARIN DW (KG): 92.1   Vital Signs: Temp: 98.2 F (36.8 C) (06/29 1058) Temp Source: Oral (06/29 1058) BP: 142/78 (06/29 1031) Pulse Rate: 75 (06/29 1031)  Labs: Recent Labs    06/04/19 1445  06/04/19 1850 06/04/19 2030 06/04/19 2323 06/05/19 0427  HGB 14.5  --   --   --   --  13.4  HCT 43.2  --   --   --   --  38.6*  PLT 153  --   --   --   --  133*  APTT 27  --   --   --   --   --   LABPROT 14.6  --   --   --   --  15.2  INR 1.2  --   --   --   --  1.2  HEPARINUNFRC  --   --   --   --   --  0.35  CREATININE 1.30*  --   --   --   --  1.17  TROPONINIHS 8   < > 358* 455* 1,003*  --    < > = values in this interval not displayed.    Estimated Creatinine Clearance: 66.2 mL/min (by C-G formula based on SCr of 1.17 mg/dL).   Medical History: Past Medical History:  Diagnosis Date  . Arthritis    OA AND PAIN BOTH KNEES  . Asthma    AS A CHILD  . Chronic knee pain   . Coronary artery disease    PTCA/stenting ramus inter. vess.; 10/2008  . GERD (gastroesophageal reflux disease)    MILD--WOULD TAKE ROLAID IF NEEDED  . Hernia    "AT MY BELLY BUTTON" - NO PAIN OR PROBLEMS  . Hyperlipidemia   . Hypertension   . Restless leg syndrome   . Seasonal allergies    FREQENT BRONCHITIS. SINUS INFECTIONS WITH SEASON CHANGES  . Shingles DEC 2012   MILD CASE-NO RESIDUAL PROBLEMS  . Sleep apnea 07/06/12   STOP BANG SCORE OF 4    Medications:  Infusions:  . sodium chloride    . sodium chloride    . heparin 1,100 Units/hr (06/05/19 0900)    Assessment: 40 yom presented to the ED as a Code STEMI s/p cardiac arrest. Pt proceeded to cardiac cath where he was found to have  severe CAD. He may require CABG (but needs plavix washout period). He is not on anticoagulation PTA aside from plavix and aspirin.   Heparin level this afternoon is subtherapeutic at 0.23 (lab drawn late). CBC trending down today with platelets at 133. No further reports of facial bleeding this morning, and no issues with the heparin infusion per RN.  Goal of Therapy:  Heparin level 0.3-0.7 units/ml Monitor platelets by anticoagulation protocol: Yes   Plan:  Increase heparin infusion to 1300 units/hr Check heparin level in 6 hours Daily heparin level and CBC F/u surgery plans   Brendolyn Patty, PharmD PGY1 Pharmacy Resident Phone [phone]  06/05/2019   1:07 PM

## 2019-06-05 NOTE — Progress Notes (Signed)
ANTICOAGULATION CONSULT NOTE - Follow Up Consult  Pharmacy Consult for IV Heparin Indication: chest pain/ACS  Allergies  Allergen Reactions  . Clarithromycin Other (See Comments)    Reaction=dizziness    Patient Measurements: Height: 6\' 3"  (190.5 cm) Weight: 203 lb (92.1 kg) IBW/kg (Calculated) : 84.5 Heparin Dosing Weight: 92.1 kg  Vital Signs: Temp: 98.9 F (37.2 C) (06/29 1510) Temp Source: Oral (06/29 1510) BP: 148/79 (06/29 2100) Pulse Rate: 65 (06/29 2100)  Labs: Recent Labs    06/04/19 1445  06/04/19 1850 06/04/19 2030 06/04/19 2323 06/05/19 0427 06/05/19 1137 06/05/19 2045  HGB 14.5  --   --   --   --  13.4  --   --   HCT 43.2  --   --   --   --  38.6*  --   --   PLT 153  --   --   --   --  133*  --   --   APTT 27  --   --   --   --   --   --   --   LABPROT 14.6  --   --   --   --  15.2  --   --   INR 1.2  --   --   --   --  1.2  --   --   HEPARINUNFRC  --   --   --   --   --  0.35 0.23* 0.53  CREATININE 1.30*  --   --   --   --  1.17  --   --   TROPONINIHS 8   < > 358* 455* 1,003*  --   --   --    < > = values in this interval not displayed.    Estimated Creatinine Clearance: 66.2 mL/min (by C-G formula based on SCr of 1.17 mg/dL).   Assessment: 27 yom presented to the ED as a Code STEMI s/p cardiac arrest. Pt proceeded to cardiac cath where he was found to have severe CAD. He may require CABG (but needs plavix washout period). He is not on anticoagulation PTA aside from plavix and aspirin.   Heparin level is therapeutic at 0.53 after increase to 1300 units/hr. CBC trending down today with platelets at 133. Per RN, no issues noted and no bleeding.    Goal of Therapy:  Heparin level 0.3-0.7 units/ml Monitor platelets by anticoagulation protocol: Yes   Plan:  Continue Heparin at 1300 units/hr.  Daily Heparin level and CBC.   Sloan Leiter, PharmD, BCPS, BCCCP Clinical Pharmacist Please refer to [facility] for [place] numbers 06/05/2019,10:15  PM

## 2019-06-05 NOTE — Progress Notes (Signed)
  Echocardiogram 2D Echocardiogram has been performed.  Jannett Celestine 06/05/2019, 11:49 AM

## 2019-06-05 NOTE — Progress Notes (Signed)
Belongings given to wife Caren Griffins.  Items: [employer] wallet in biohazard bag, suit w/tie, shoes, watch, medicine and pocket survival gaget.  Cellphone w/charger was given to T. Clearance Chenault,RN and taken to patient as requested. Clarified w/wife and patient he was wearing his ring. Contact information was also exchanged.

## 2019-06-05 NOTE — Progress Notes (Signed)
[id number] Gave pt [facility] booklet, care guide and in the tube handout. Discussed sternal precautions and staying in the tube. Discussed importance of IS and mobility. Gave IS and pt demonstrated 2500 ml correctly. Wrote down how to view pre op video and encouraged pt to view. Understanding of ed done. Set up lunch tray. Will continue to follow.  Pt stated wife available after discharge to assist with care. Graylon Good RN BSN 06/05/2019 11:54 AM

## 2019-06-05 NOTE — Progress Notes (Signed)
Progress Note  Patient Name: [name] Date of Encounter: 06/05/2019  Primary Cardiologist: Mertie Moores, MD   Subjective   73 yo with hx of CAD.   S/p stenting in 2008 Presented with cardiac arrest, CPR. At cath, has significant prox LAD disease, LCx disease.   The RI stent is patent .   Dr. Angelena Form was not able to cross with the guide wire.    He is not awake, pain free.   The plan is for CABG on Thursday.    Inpatient Medications    Scheduled Meds:  aspirin EC  81 mg Oral q morning - 10a   atorvastatin  80 mg Oral Daily   Chlorhexidine Gluconate Cloth  6 each Topical Q0600   metoprolol tartrate  25 mg Oral BID   sodium chloride flush  3 mL Intravenous Q12H   Continuous Infusions:  sodium chloride     sodium chloride     heparin 1,100 Units/hr (06/05/19 0900)   PRN Meds: sodium chloride, acetaminophen, lip balm, nitroGLYCERIN, ondansetron (ZOFRAN) IV, sodium chloride flush   Vital Signs    Vitals:   06/05/19 0700 06/05/19 0724 06/05/19 0800 06/05/19 0900  BP: 138/84  109/60 134/68  Pulse: 75  75 83  Resp: 14  12 (!) 22  Temp:  97.7 F (36.5 C)    TempSrc:  Axillary    SpO2: 99%  100% 99%  Weight:      Height:        Intake/Output Summary (Last 24 hours) at 06/05/2019 1031 Last data filed at 06/05/2019 0900 Gross per 24 hour  Intake 603.64 ml  Output 1025 ml  Net -421.36 ml   Last 3 Weights 06/04/2019 03/02/2018 02/26/2017  Weight (lbs) 203 lb 221 lb 6.4 oz 224 lb  Weight (kg) 92.08 kg 100.426 kg 101.606 kg      Telemetry    NSR  - Personally Reviewed  ECG     NSR  - Personally Reviewed  Physical Exam   GEN: No acute distress.   Neck: No JVD Cardiac: RRR, no murmurs, rubs, or gallops.  Respiratory: Clear to auscultation bilaterally. GI: Soft, nontender, non-distended  MS: No edema; No deformity. Neuro:  Nonfocal  Psych: Normal affect   Labs    High Sensitivity Troponin:   Recent Labs  Lab 06/04/19 1445 06/04/19 1658  06/04/19 1850 06/04/19 2030 06/04/19 2323  TROPONINIHS 8 166* 358* 455* 1,003*      Cardiac EnzymesNo results for input(s): TROPONINI in the last 168 hours. No results for input(s): TROPIPOC in the last 168 hours.   Chemistry Recent Labs  Lab 06/04/19 1445 06/05/19 0427  NA 139 140  K 3.9 3.9  CL 109 111  CO2 14* 22  GLUCOSE 158* 113*  BUN 13 12  CREATININE 1.30* 1.17  CALCIUM 8.9 8.8*  PROT 6.3*  --   ALBUMIN 3.8  --   AST 81*  --   ALT 72*  --   ALKPHOS 57  --   BILITOT 1.0  --   GFRNONAA 54* >60  GFRAA >60 >60  ANIONGAP 16* 7     Hematology Recent Labs  Lab 06/04/19 1445 06/05/19 0427  WBC 9.9 9.9  RBC 4.46 4.04*  HGB 14.5 13.4  HCT 43.2 38.6*  MCV 96.9 95.5  MCH 32.5 33.2  MCHC 33.6 34.7  RDW 13.0 13.2  PLT 153 133*    BNPNo results for input(s): BNP, PROBNP in the last 168 hours.  DDimer No results for input(s): DDIMER in the last 168 hours.   Radiology    Dg Chest 2 View  Result Date: 06/05/2019 CLINICAL DATA:  Preop cardiac surgery. EXAM: CHEST - 2 VIEW COMPARISON:  06/04/2019 FINDINGS: The heart size and mediastinal contours are within normal limits. Both lungs are clear. The visualized skeletal structures are unremarkable. IMPRESSION: No active cardiopulmonary disease. Electronically Signed   By: Kerby Moors M.D.   On: 06/05/2019 09:46   Ct Head Wo Contrast  Result Date: 06/04/2019 CLINICAL DATA:  Cardiac arrest.  Fall. EXAM: CT HEAD WITHOUT CONTRAST CT MAXILLOFACIAL WITHOUT CONTRAST CT CERVICAL SPINE WITHOUT CONTRAST TECHNIQUE: Multidetector CT imaging of the head, cervical spine, and maxillofacial structures were performed using the standard protocol without intravenous contrast. Multiplanar CT image reconstructions of the cervical spine and maxillofacial structures were also generated. COMPARISON:  None. FINDINGS: CT HEAD FINDINGS Brain: There is no evidence of acute infarct, intracranial hemorrhage, mass, midline shift, or extra-axial  fluid collection. Mild cerebral atrophy is within normal limits for age. Cerebral white matter hypodensities are nonspecific but compatible with minimal chronic small vessel ischemic disease. Vascular: Calcified atherosclerosis at the skull base. Skull: No fracture or suspicious osseous lesion. Other: Left frontal scalp swelling. CT MAXILLOFACIAL FINDINGS Osseous: No acute fracture, mandibular dislocation, or suspicious osseous lesion. Orbits: Unremarkable. Sinuses: Extensive mucosal thickening throughout the paranasal sinuses bilaterally. Clear mastoid air cells. Soft tissues: Mild left frontal scalp swelling. CT CERVICAL SPINE FINDINGS Alignment: Trace anterolisthesis of C4 on C5 and C7 on T1, likely degenerative and facet mediated. Slight reversal of the normal lordosis in the lower cervical spine. Skull base and vertebrae: No acute fracture or suspicious osseous lesion. Moderate median C1-2 arthropathy. Soft tissues and spinal canal: No prevertebral fluid or swelling. No visible canal hematoma. Disc levels: Multilevel disc degeneration, most severe at C5-6 and C6-7. Mostly mild multilevel neural foraminal stenosis. Upper chest: Clear lung apices. Other: 8 mm calcified nodule in the left thyroid lobe. Mild calcified atherosclerosis about the carotid bifurcations. IMPRESSION: 1. No evidence of acute intracranial abnormality. 2. No acute maxillofacial or cervical spine fracture. These results were called by telephone at the time of interpretation on 06/04/2019 at 3:15 pm to Dr. Gareth Morgan, who verbally acknowledged these results. Electronically Signed   By: Logan Bores M.D.   On: 06/04/2019 16:02   Ct Cervical Spine Wo Contrast  Result Date: 06/04/2019 CLINICAL DATA:  Cardiac arrest.  Fall. EXAM: CT HEAD WITHOUT CONTRAST CT MAXILLOFACIAL WITHOUT CONTRAST CT CERVICAL SPINE WITHOUT CONTRAST TECHNIQUE: Multidetector CT imaging of the head, cervical spine, and maxillofacial structures were performed using the  standard protocol without intravenous contrast. Multiplanar CT image reconstructions of the cervical spine and maxillofacial structures were also generated. COMPARISON:  None. FINDINGS: CT HEAD FINDINGS Brain: There is no evidence of acute infarct, intracranial hemorrhage, mass, midline shift, or extra-axial fluid collection. Mild cerebral atrophy is within normal limits for age. Cerebral white matter hypodensities are nonspecific but compatible with minimal chronic small vessel ischemic disease. Vascular: Calcified atherosclerosis at the skull base. Skull: No fracture or suspicious osseous lesion. Other: Left frontal scalp swelling. CT MAXILLOFACIAL FINDINGS Osseous: No acute fracture, mandibular dislocation, or suspicious osseous lesion. Orbits: Unremarkable. Sinuses: Extensive mucosal thickening throughout the paranasal sinuses bilaterally. Clear mastoid air cells. Soft tissues: Mild left frontal scalp swelling. CT CERVICAL SPINE FINDINGS Alignment: Trace anterolisthesis of C4 on C5 and C7 on T1, likely degenerative and facet mediated. Slight reversal of the normal lordosis in the  lower cervical spine. Skull base and vertebrae: No acute fracture or suspicious osseous lesion. Moderate median C1-2 arthropathy. Soft tissues and spinal canal: No prevertebral fluid or swelling. No visible canal hematoma. Disc levels: Multilevel disc degeneration, most severe at C5-6 and C6-7. Mostly mild multilevel neural foraminal stenosis. Upper chest: Clear lung apices. Other: 8 mm calcified nodule in the left thyroid lobe. Mild calcified atherosclerosis about the carotid bifurcations. IMPRESSION: 1. No evidence of acute intracranial abnormality. 2. No acute maxillofacial or cervical spine fracture. These results were called by telephone at the time of interpretation on 06/04/2019 at 3:15 pm to Dr. Gareth Morgan, who verbally acknowledged these results. Electronically Signed   By: Logan Bores M.D.   On: 06/04/2019 16:02   Dg  Chest Portable 1 View  Result Date: 06/04/2019 CLINICAL DATA:  Post arrest, CPR EXAM: PORTABLE CHEST 1 VIEW COMPARISON:  12/09/2017 FINDINGS: The heart size and mediastinal contours are within normal limits. Both lungs are clear. The visualized skeletal structures are unremarkable. IMPRESSION: No acute abnormality of the lungs in AP portable projection. Electronically Signed   By: Eddie Candle M.D.   On: 06/04/2019 15:23   Ct Maxillofacial Wo Cm  Result Date: 06/04/2019 CLINICAL DATA:  Cardiac arrest.  Fall. EXAM: CT HEAD WITHOUT CONTRAST CT MAXILLOFACIAL WITHOUT CONTRAST CT CERVICAL SPINE WITHOUT CONTRAST TECHNIQUE: Multidetector CT imaging of the head, cervical spine, and maxillofacial structures were performed using the standard protocol without intravenous contrast. Multiplanar CT image reconstructions of the cervical spine and maxillofacial structures were also generated. COMPARISON:  None. FINDINGS: CT HEAD FINDINGS Brain: There is no evidence of acute infarct, intracranial hemorrhage, mass, midline shift, or extra-axial fluid collection. Mild cerebral atrophy is within normal limits for age. Cerebral white matter hypodensities are nonspecific but compatible with minimal chronic small vessel ischemic disease. Vascular: Calcified atherosclerosis at the skull base. Skull: No fracture or suspicious osseous lesion. Other: Left frontal scalp swelling. CT MAXILLOFACIAL FINDINGS Osseous: No acute fracture, mandibular dislocation, or suspicious osseous lesion. Orbits: Unremarkable. Sinuses: Extensive mucosal thickening throughout the paranasal sinuses bilaterally. Clear mastoid air cells. Soft tissues: Mild left frontal scalp swelling. CT CERVICAL SPINE FINDINGS Alignment: Trace anterolisthesis of C4 on C5 and C7 on T1, likely degenerative and facet mediated. Slight reversal of the normal lordosis in the lower cervical spine. Skull base and vertebrae: No acute fracture or suspicious osseous lesion. Moderate  median C1-2 arthropathy. Soft tissues and spinal canal: No prevertebral fluid or swelling. No visible canal hematoma. Disc levels: Multilevel disc degeneration, most severe at C5-6 and C6-7. Mostly mild multilevel neural foraminal stenosis. Upper chest: Clear lung apices. Other: 8 mm calcified nodule in the left thyroid lobe. Mild calcified atherosclerosis about the carotid bifurcations. IMPRESSION: 1. No evidence of acute intracranial abnormality. 2. No acute maxillofacial or cervical spine fracture. These results were called by telephone at the time of interpretation on 06/04/2019 at 3:15 pm to Dr. Gareth Morgan, who verbally acknowledged these results. Electronically Signed   By: Logan Bores M.D.   On: 06/04/2019 16:02    Cardiac Studies     Patient Profile     73 y.o. male  With cad,  S/p cardiac arrest .   Doing well   Assessment & Plan    CAD :   S/p cardiac arrest.   Plans are for CABG on Thursday   2.  Cardiac arrest:   Doing well.   Awake, alert,   recongnized me from across the room ( even with a  mask on )      For questions or updates, please contact [place] Please consult [email or web address] for contact info under        Signed, Mertie Moores, MD  06/05/2019, 10:31 AM

## 2019-06-05 NOTE — Progress Notes (Signed)
ANTICOAGULATION CONSULT NOTE - Follow Up Consult  Pharmacy Consult for heparin Indication: CAD awaiting CABG  Labs: Recent Labs    06/04/19 1445  06/04/19 1850 06/04/19 2030 06/04/19 2323 06/05/19 0427  HGB 14.5  --   --   --   --  13.4  HCT 43.2  --   --   --   --  38.6*  PLT 153  --   --   --   --  133*  APTT 27  --   --   --   --   --   LABPROT 14.6  --   --   --   --   --   INR 1.2  --   --   --   --   --   HEPARINUNFRC  --   --   --   --   --  0.35  CREATININE 1.30*  --   --   --   --   --   TROPONINIHS 8   < > 358* 455* 1,003*  --    < > = values in this interval not displayed.    Assessment/Plan:  73yo male therapeutic on heparin with initial dosing post-cath. Will continue gtt at current rate and confirm stable with additional level.   Wynona Neat, PharmD, BCPS  06/05/2019,5:07 AM

## 2019-06-06 ENCOUNTER — Inpatient Hospital Stay (HOSPITAL_COMMUNITY): Payer: PPO

## 2019-06-06 DIAGNOSIS — M79609 Pain in unspecified limb: Secondary | ICD-10-CM

## 2019-06-06 DIAGNOSIS — M7989 Other specified soft tissue disorders: Secondary | ICD-10-CM

## 2019-06-06 DIAGNOSIS — Z01810 Encounter for preprocedural cardiovascular examination: Secondary | ICD-10-CM

## 2019-06-06 DIAGNOSIS — I2510 Atherosclerotic heart disease of native coronary artery without angina pectoris: Secondary | ICD-10-CM

## 2019-06-06 DIAGNOSIS — E782 Mixed hyperlipidemia: Secondary | ICD-10-CM

## 2019-06-06 LAB — SPIROMETRY WITH GRAPH
FEF 25-75 Post: 3.25 L/sec
FEF 25-75 Pre: 2.92 L/sec
FEF2575-%Change-Post: 11 %
FEF2575-%Pred-Post: 117 %
FEF2575-%Pred-Pre: 105 %
FEV1-%Change-Post: 2 %
FEV1-%Pred-Post: 85 %
FEV1-%Pred-Pre: 83 %
FEV1-Post: 3.22 L
FEV1-Pre: 3.16 L
FEV1FVC-%Change-Post: 3 %
FEV1FVC-%Pred-Pre: 108 %
FEV6-%Change-Post: 0 %
FEV6-%Pred-Post: 81 %
FEV6-%Pred-Pre: 80 %
FEV6-Post: 3.94 L
FEV6-Pre: 3.93 L
FEV6FVC-%Change-Post: 1 %
FEV6FVC-%Pred-Post: 105 %
FEV6FVC-%Pred-Pre: 104 %
FVC-%Change-Post: 0 %
FVC-%Pred-Post: 76 %
FVC-%Pred-Pre: 77 %
FVC-Post: 3.95 L
FVC-Pre: 3.99 L
Post FEV1/FVC ratio: 82 %
Post FEV6/FVC ratio: 100 %
Pre FEV1/FVC ratio: 79 %
Pre FEV6/FVC Ratio: 99 %

## 2019-06-06 LAB — SURGICAL PCR SCREEN
MRSA, PCR: NEGATIVE
Staphylococcus aureus: NEGATIVE

## 2019-06-06 LAB — CBC
HCT: 37.3 % — ABNORMAL LOW (ref 39.0–52.0)
Hemoglobin: 12.3 g/dL — ABNORMAL LOW (ref 13.0–17.0)
MCH: 32.1 pg (ref 26.0–34.0)
MCHC: 33 g/dL (ref 30.0–36.0)
MCV: 97.4 fL (ref 80.0–100.0)
Platelets: 123 10*3/uL — ABNORMAL LOW (ref 150–400)
RBC: 3.83 MIL/uL — ABNORMAL LOW (ref 4.22–5.81)
RDW: 13.2 % (ref 11.5–15.5)
WBC: 8.5 10*3/uL (ref 4.0–10.5)
nRBC: 0 % (ref 0.0–0.2)

## 2019-06-06 LAB — HEPARIN LEVEL (UNFRACTIONATED): Heparin Unfractionated: 0.56 IU/mL (ref 0.30–0.70)

## 2019-06-06 MED ORDER — FAMOTIDINE 20 MG PO TABS
20.0000 mg | ORAL_TABLET | Freq: Every day | ORAL | Status: DC
  Administered 2019-06-06 – 2019-06-14 (×8): 20 mg via ORAL
  Filled 2019-06-06 (×8): qty 1

## 2019-06-06 MED ORDER — ALBUTEROL SULFATE (2.5 MG/3ML) 0.083% IN NEBU
2.5000 mg | INHALATION_SOLUTION | Freq: Once | RESPIRATORY_TRACT | Status: AC
  Administered 2019-06-06: 2.5 mg via RESPIRATORY_TRACT

## 2019-06-06 NOTE — Progress Notes (Signed)
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 % stenosis- asymptomatic Objective: Vital signs in last 24 hours: Temp:  [98 F (36.7 C)-98.4 F (36.9 C)] 98 F (36.7 C) (06/30 1329) Pulse Rate:  [58-78] 78 (06/30 1217) Cardiac Rhythm: Normal sinus rhythm (06/30 0800) Resp:  [11-23] 16 (06/30 1329) BP: (98-161)/(62-92) 128/64 (06/30 1329) SpO2:  [92 %-100 %] 100 % (06/30 1329)  Hemodynamic parameters for last 24 hours:   nsr Intake/Output from previous day: 06/29 0701 - 06/30 0700 In: 983.9 [P.O.:690; I.V.:293.9] Out: -  Intake/Output this shift: Total I/O In: 304.9 [P.O.:240; I.V.:64.9] Out: 575 [Urine:575]       Exam    General- alert and comfortable    Neck- no JVD, no cervical adenopathy palpable, no carotid bruit   Lungs- clear without rales, wheezes   Cor- regular rate and rhythm, no murmur , gallop   Abdomen- soft, non-tender   Extremities - warm, non-tender, minimal edema   Neuro- oriented, appropriate, no focal weakness   Lab Results: Recent Labs    06/05/19 0427 06/06/19 0246  WBC 9.9 8.5  HGB 13.4 12.3*  HCT 38.6* 37.3*  PLT 133* 123*   BMET:  Recent Labs    06/04/19 1445 06/05/19 0427  NA 139 140  K 3.9 3.9  CL 109 111  CO2 14* 22  GLUCOSE 158* 113*  BUN 13 12  CREATININE 1.30* 1.17  CALCIUM 8.9 8.8*    PT/INR:  Recent Labs    06/05/19 0427  LABPROT 15.2  INR 1.2   ABG No results found for: PHART, HCO3, TCO2, ACIDBASEDEF, O2SAT CBG (last 3)  No results for input(s): GLUCAP in the last 72 hours.  Assessment/Plan: S/P Procedure(s) (LRB): Coronary/Graft Acute MI Revascularization (N/A) LEFT HEART CATH AND CORONARY ANGIOGRAPHY (N/A) CABG 7-2   LOS: 2 days    [name] 06/06/2019

## 2019-06-06 NOTE — Progress Notes (Signed)
Progress Note  Patient Name: [name] Date of Encounter: 06/06/2019  Primary Cardiologist: Mertie Moores, MD   Subjective   73 year old gentleman with a history of coronary artery disease.  He presented after having an out of hospital cardiac arrest.  He was resuscitated.  Heart catheterization revealed severe coronary disease including a very tight proximal LAD stenosis and significant circumflex disease.  The plan is for coronary artery bypass grafting on Thursday following Plavix washout.  He complains of a small knot on the back of his right calf.  Venous duplex scan has been ordered.  Inpatient Medications    Scheduled Meds:  aspirin EC  81 mg Oral q morning - 10a   atorvastatin  80 mg Oral Daily   Chlorhexidine Gluconate Cloth  6 each Topical Q0600   metoprolol tartrate  25 mg Oral BID   sodium chloride flush  3 mL Intravenous Q12H   Continuous Infusions:  sodium chloride     sodium chloride     heparin 1,300 Units/hr (06/06/19 0700)   PRN Meds: sodium chloride, acetaminophen, lip balm, nitroGLYCERIN, ondansetron (ZOFRAN) IV, sodium chloride flush   Vital Signs    Vitals:   06/06/19 0400 06/06/19 0500 06/06/19 0600 06/06/19 0700  BP: 136/88 (!) 143/86 130/82 (!) 161/92  Pulse: 64 67 63 65  Resp: 11 13 17 18   Temp: 98.3 F (36.8 C)   98.4 F (36.9 C)  TempSrc: Oral   Oral  SpO2: 98% 98% 98% 99%  Weight:      Height:        Intake/Output Summary (Last 24 hours) at 06/06/2019 0820 Last data filed at 06/06/2019 0800 Gross per 24 hour  Intake 1212.85 ml  Output 575 ml  Net 637.85 ml   Last 3 Weights 06/04/2019 03/02/2018 02/26/2017  Weight (lbs) 203 lb 221 lb 6.4 oz 224 lb  Weight (kg) 92.08 kg 100.426 kg 101.606 kg      Telemetry    nsr  - Personally Reviewed  ECG     nsr  - Personally Reviewed  Physical Exam   GEN: No acute distress.  Elderly gentleman, no chest pain. Neck: No JVD Cardiac: RRR, no murmurs, rubs, or gallops.    Respiratory: Clear to auscultation bilaterally. GI: Soft, nontender, non-distended  MS: No edema; No deformity.  He has a small knot about the size of a walnut on the back of his right upper calf.  It is mildly tender.  Mild Homans sign. Neuro:  Nonfocal  Psych: Normal affect   Labs    High Sensitivity Troponin:   Recent Labs  Lab 06/04/19 1445 06/04/19 1658 06/04/19 1850 06/04/19 2030 06/04/19 2323  TROPONINIHS 8 166* 358* 455* 1,003*      Cardiac EnzymesNo results for input(s): TROPONINI in the last 168 hours. No results for input(s): TROPIPOC in the last 168 hours.   Chemistry Recent Labs  Lab 06/04/19 1445 06/05/19 0427  NA 139 140  K 3.9 3.9  CL 109 111  CO2 14* 22  GLUCOSE 158* 113*  BUN 13 12  CREATININE 1.30* 1.17  CALCIUM 8.9 8.8*  PROT 6.3*  --   ALBUMIN 3.8  --   AST 81*  --   ALT 72*  --   ALKPHOS 57  --   BILITOT 1.0  --   GFRNONAA 54* >60  GFRAA >60 >60  ANIONGAP 16* 7     Hematology Recent Labs  Lab 06/04/19 1445 06/05/19 0427 06/06/19 0246  WBC  9.9 9.9 8.5  RBC 4.46 4.04* 3.83*  HGB 14.5 13.4 12.3*  HCT 43.2 38.6* 37.3*  MCV 96.9 95.5 97.4  MCH 32.5 33.2 32.1  MCHC 33.6 34.7 33.0  RDW 13.0 13.2 13.2  PLT 153 133* 123*    BNPNo results for input(s): BNP, PROBNP in the last 168 hours.   DDimer No results for input(s): DDIMER in the last 168 hours.   Radiology    Dg Chest 2 View  Result Date: 06/05/2019 CLINICAL DATA:  Preop cardiac surgery. EXAM: CHEST - 2 VIEW COMPARISON:  06/04/2019 FINDINGS: The heart size and mediastinal contours are within normal limits. Both lungs are clear. The visualized skeletal structures are unremarkable. IMPRESSION: No active cardiopulmonary disease. Electronically Signed   By: Kerby Moors M.D.   On: 06/05/2019 09:46   Ct Head Wo Contrast  Result Date: 06/04/2019 CLINICAL DATA:  Cardiac arrest.  Fall. EXAM: CT HEAD WITHOUT CONTRAST CT MAXILLOFACIAL WITHOUT CONTRAST CT CERVICAL SPINE WITHOUT  CONTRAST TECHNIQUE: Multidetector CT imaging of the head, cervical spine, and maxillofacial structures were performed using the standard protocol without intravenous contrast. Multiplanar CT image reconstructions of the cervical spine and maxillofacial structures were also generated. COMPARISON:  None. FINDINGS: CT HEAD FINDINGS Brain: There is no evidence of acute infarct, intracranial hemorrhage, mass, midline shift, or extra-axial fluid collection. Mild cerebral atrophy is within normal limits for age. Cerebral white matter hypodensities are nonspecific but compatible with minimal chronic small vessel ischemic disease. Vascular: Calcified atherosclerosis at the skull base. Skull: No fracture or suspicious osseous lesion. Other: Left frontal scalp swelling. CT MAXILLOFACIAL FINDINGS Osseous: No acute fracture, mandibular dislocation, or suspicious osseous lesion. Orbits: Unremarkable. Sinuses: Extensive mucosal thickening throughout the paranasal sinuses bilaterally. Clear mastoid air cells. Soft tissues: Mild left frontal scalp swelling. CT CERVICAL SPINE FINDINGS Alignment: Trace anterolisthesis of C4 on C5 and C7 on T1, likely degenerative and facet mediated. Slight reversal of the normal lordosis in the lower cervical spine. Skull base and vertebrae: No acute fracture or suspicious osseous lesion. Moderate median C1-2 arthropathy. Soft tissues and spinal canal: No prevertebral fluid or swelling. No visible canal hematoma. Disc levels: Multilevel disc degeneration, most severe at C5-6 and C6-7. Mostly mild multilevel neural foraminal stenosis. Upper chest: Clear lung apices. Other: 8 mm calcified nodule in the left thyroid lobe. Mild calcified atherosclerosis about the carotid bifurcations. IMPRESSION: 1. No evidence of acute intracranial abnormality. 2. No acute maxillofacial or cervical spine fracture. These results were called by telephone at the time of interpretation on 06/04/2019 at 3:15 pm to Dr. Gareth Morgan, who verbally acknowledged these results. Electronically Signed   By: Logan Bores M.D.   On: 06/04/2019 16:02   Ct Cervical Spine Wo Contrast  Result Date: 06/04/2019 CLINICAL DATA:  Cardiac arrest.  Fall. EXAM: CT HEAD WITHOUT CONTRAST CT MAXILLOFACIAL WITHOUT CONTRAST CT CERVICAL SPINE WITHOUT CONTRAST TECHNIQUE: Multidetector CT imaging of the head, cervical spine, and maxillofacial structures were performed using the standard protocol without intravenous contrast. Multiplanar CT image reconstructions of the cervical spine and maxillofacial structures were also generated. COMPARISON:  None. FINDINGS: CT HEAD FINDINGS Brain: There is no evidence of acute infarct, intracranial hemorrhage, mass, midline shift, or extra-axial fluid collection. Mild cerebral atrophy is within normal limits for age. Cerebral white matter hypodensities are nonspecific but compatible with minimal chronic small vessel ischemic disease. Vascular: Calcified atherosclerosis at the skull base. Skull: No fracture or suspicious osseous lesion. Other: Left frontal scalp swelling. CT MAXILLOFACIAL FINDINGS Osseous:  No acute fracture, mandibular dislocation, or suspicious osseous lesion. Orbits: Unremarkable. Sinuses: Extensive mucosal thickening throughout the paranasal sinuses bilaterally. Clear mastoid air cells. Soft tissues: Mild left frontal scalp swelling. CT CERVICAL SPINE FINDINGS Alignment: Trace anterolisthesis of C4 on C5 and C7 on T1, likely degenerative and facet mediated. Slight reversal of the normal lordosis in the lower cervical spine. Skull base and vertebrae: No acute fracture or suspicious osseous lesion. Moderate median C1-2 arthropathy. Soft tissues and spinal canal: No prevertebral fluid or swelling. No visible canal hematoma. Disc levels: Multilevel disc degeneration, most severe at C5-6 and C6-7. Mostly mild multilevel neural foraminal stenosis. Upper chest: Clear lung apices. Other: 8 mm calcified nodule  in the left thyroid lobe. Mild calcified atherosclerosis about the carotid bifurcations. IMPRESSION: 1. No evidence of acute intracranial abnormality. 2. No acute maxillofacial or cervical spine fracture. These results were called by telephone at the time of interpretation on 06/04/2019 at 3:15 pm to Dr. Gareth Morgan, who verbally acknowledged these results. Electronically Signed   By: Logan Bores M.D.   On: 06/04/2019 16:02   Dg Chest Port 1 View  Result Date: 06/06/2019 CLINICAL DATA:  STEMI EXAM: PORTABLE CHEST 1 VIEW COMPARISON:  06/05/2019 FINDINGS: Cardiac shadows within normal limits. The lungs are clear bilaterally. No bony abnormality is noted. IMPRESSION: No active disease. Electronically Signed   By: Inez Catalina M.D.   On: 06/06/2019 07:10   Dg Chest Portable 1 View  Result Date: 06/04/2019 CLINICAL DATA:  Post arrest, CPR EXAM: PORTABLE CHEST 1 VIEW COMPARISON:  12/09/2017 FINDINGS: The heart size and mediastinal contours are within normal limits. Both lungs are clear. The visualized skeletal structures are unremarkable. IMPRESSION: No acute abnormality of the lungs in AP portable projection. Electronically Signed   By: Eddie Candle M.D.   On: 06/04/2019 15:23   Ct Maxillofacial Wo Cm  Result Date: 06/04/2019 CLINICAL DATA:  Cardiac arrest.  Fall. EXAM: CT HEAD WITHOUT CONTRAST CT MAXILLOFACIAL WITHOUT CONTRAST CT CERVICAL SPINE WITHOUT CONTRAST TECHNIQUE: Multidetector CT imaging of the head, cervical spine, and maxillofacial structures were performed using the standard protocol without intravenous contrast. Multiplanar CT image reconstructions of the cervical spine and maxillofacial structures were also generated. COMPARISON:  None. FINDINGS: CT HEAD FINDINGS Brain: There is no evidence of acute infarct, intracranial hemorrhage, mass, midline shift, or extra-axial fluid collection. Mild cerebral atrophy is within normal limits for age. Cerebral white matter hypodensities are nonspecific  but compatible with minimal chronic small vessel ischemic disease. Vascular: Calcified atherosclerosis at the skull base. Skull: No fracture or suspicious osseous lesion. Other: Left frontal scalp swelling. CT MAXILLOFACIAL FINDINGS Osseous: No acute fracture, mandibular dislocation, or suspicious osseous lesion. Orbits: Unremarkable. Sinuses: Extensive mucosal thickening throughout the paranasal sinuses bilaterally. Clear mastoid air cells. Soft tissues: Mild left frontal scalp swelling. CT CERVICAL SPINE FINDINGS Alignment: Trace anterolisthesis of C4 on C5 and C7 on T1, likely degenerative and facet mediated. Slight reversal of the normal lordosis in the lower cervical spine. Skull base and vertebrae: No acute fracture or suspicious osseous lesion. Moderate median C1-2 arthropathy. Soft tissues and spinal canal: No prevertebral fluid or swelling. No visible canal hematoma. Disc levels: Multilevel disc degeneration, most severe at C5-6 and C6-7. Mostly mild multilevel neural foraminal stenosis. Upper chest: Clear lung apices. Other: 8 mm calcified nodule in the left thyroid lobe. Mild calcified atherosclerosis about the carotid bifurcations. IMPRESSION: 1. No evidence of acute intracranial abnormality. 2. No acute maxillofacial or cervical spine fracture. These results  were called by telephone at the time of interpretation on 06/04/2019 at 3:15 pm to Dr. Gareth Morgan, who verbally acknowledged these results. Electronically Signed   By: Logan Bores M.D.   On: 06/04/2019 16:02    Cardiac Studies     Patient Profile     73 y.o. male admitted with out of hospital cardiac arrest.  He is found to have significant coronary artery disease and is scheduled for bypass surgery.  Assessment & Plan    Coronary artery disease: The patient has severe disease involving his LAD with moderate disease involving his ramus intermediate branch in his circumflex artery.  The proximal ramus intermedius stent placed years  ago is widely patent.  There is some disease distal to that.  The plan is for coronary artery bypass grafting following washout of his Plavix.  He remains very stable.  2.  Right calf tenderness.  He has a small knot on the back of his right calf.  This could be hematoma from his fall following his cardiac arrest.  Dr. Lucianne Lei trigt  has ordered a venous duplex scan.  Hyperlipidemia: His current lipid levels include a cholesterol level 140.  The HDL is 56.  The LDL is 61.  Triglyceride levels 114. Continue atorvastatin 80 mg a day.   For questions or updates, please contact [facility] Please consult [email or web address] for contact info under        Signed, Mertie Moores, MD  06/06/2019, 8:20 AM

## 2019-06-06 NOTE — Progress Notes (Signed)
ANTICOAGULATION CONSULT NOTE - Initial Consult  Pharmacy Consult for heparin Indication: chest pain/ACS  Allergies  Allergen Reactions  . Clarithromycin Other (See Comments)    Reaction=dizziness    Patient Measurements: Height: 6\' 3"  (190.5 cm) Weight: 203 lb (92.1 kg) IBW/kg (Calculated) : 84.5 HEPARIN DW (KG): 92.1   Vital Signs: Temp: 98.4 F (36.9 C) (06/30 0700) Temp Source: Oral (06/30 0700) BP: 141/81 (06/30 1000) Pulse Rate: 73 (06/30 0900)  Labs: Recent Labs    06/04/19 1445  06/04/19 1850 06/04/19 2030 06/04/19 2323  06/05/19 0427 06/05/19 1137 06/05/19 2045 06/06/19 0246  HGB 14.5  --   --   --   --   --  13.4  --   --  12.3*  HCT 43.2  --   --   --   --   --  38.6*  --   --  37.3*  PLT 153  --   --   --   --   --  133*  --   --  123*  APTT 27  --   --   --   --   --   --   --   --   --   LABPROT 14.6  --   --   --   --   --  15.2  --   --   --   INR 1.2  --   --   --   --   --  1.2  --   --   --   HEPARINUNFRC  --   --   --   --   --    < > 0.35 0.23* 0.53 0.56  CREATININE 1.30*  --   --   --   --   --  1.17  --   --   --   TROPONINIHS 8   < > 358* 455* 1,003*  --   --   --   --   --    < > = values in this interval not displayed.    Estimated Creatinine Clearance: 66.2 mL/min (by C-G formula based on SCr of 1.17 mg/dL).   Medical History: Past Medical History:  Diagnosis Date  . Arthritis    OA AND PAIN BOTH KNEES  . Asthma    AS A CHILD  . Chronic knee pain   . Coronary artery disease    PTCA/stenting ramus inter. vess.; 10/2008  . GERD (gastroesophageal reflux disease)    MILD--WOULD TAKE ROLAID IF NEEDED  . Hernia    "AT MY BELLY BUTTON" - NO PAIN OR PROBLEMS  . Hyperlipidemia   . Hypertension   . Restless leg syndrome   . Seasonal allergies    FREQENT BRONCHITIS. SINUS INFECTIONS WITH SEASON CHANGES  . Shingles DEC 2012   MILD CASE-NO RESIDUAL PROBLEMS  . Sleep apnea 07/06/12   STOP BANG SCORE OF 4    Medications:   Infusions:  . sodium chloride    . sodium chloride    . heparin 1,300 Units/hr (06/06/19 0700)    Assessment: 78 yom presented to the ED as a Code STEMI s/p cardiac arrest. Pt proceeded to cardiac cath where he was found to have severe CAD.CABG planned for 06/08/19 (but needs plavix washout period). He is not on anticoagulation PTA aside from plavix and aspirin 81mg .   Heparin level this afternoon is therapeutic at 0.56.  Platelets at 123 down from 133. Hgb 12.3 down from 13.4 No  further reports of facial bleeding this morning, and no issues with the heparin infusion per RN notes.  Goal of Therapy:  Heparin level 0.3-0.7 units/ml Monitor platelets by anticoagulation protocol: Yes   Plan:  Continue heparin infusion at 1300 units/hr Check heparin level with AM labs Daily heparin level and follow CBC F/u surgery plans  Onnie Boer; PharmD Candidate  06/06/2019   11:03 AM

## 2019-06-06 NOTE — Progress Notes (Signed)
Right lower extremity venous duplex completed. Preliminary results in Chart review CV Proc. [facility], RVS 06/06/2019,3:37 PM

## 2019-06-06 NOTE — Progress Notes (Signed)
Pre CABG Dopplers completed. Preliminary results in Chart review CV Proc. [place] Kenora Spayd,RVS 06/06/2019, 4:03 PM

## 2019-06-07 ENCOUNTER — Telehealth: Payer: Self-pay | Admitting: Cardiovascular Disease

## 2019-06-07 ENCOUNTER — Encounter (HOSPITAL_COMMUNITY): Payer: Self-pay | Admitting: *Deleted

## 2019-06-07 LAB — PREPARE RBC (CROSSMATCH)

## 2019-06-07 LAB — HEPARIN LEVEL (UNFRACTIONATED): Heparin Unfractionated: 0.5 IU/mL (ref 0.30–0.70)

## 2019-06-07 LAB — CBC
HCT: 35.1 % — ABNORMAL LOW (ref 39.0–52.0)
Hemoglobin: 11.8 g/dL — ABNORMAL LOW (ref 13.0–17.0)
MCH: 32.8 pg (ref 26.0–34.0)
MCHC: 33.6 g/dL (ref 30.0–36.0)
MCV: 97.5 fL (ref 80.0–100.0)
Platelets: 120 10*3/uL — ABNORMAL LOW (ref 150–400)
RBC: 3.6 MIL/uL — ABNORMAL LOW (ref 4.22–5.81)
RDW: 12.9 % (ref 11.5–15.5)
WBC: 8.9 10*3/uL (ref 4.0–10.5)
nRBC: 0 % (ref 0.0–0.2)

## 2019-06-07 LAB — ABO/RH: ABO/RH(D): A POS

## 2019-06-07 MED ORDER — CHLORHEXIDINE GLUCONATE 4 % EX LIQD
60.0000 mL | Freq: Once | CUTANEOUS | Status: AC
  Administered 2019-06-08: 4 via TOPICAL
  Filled 2019-06-07: qty 60

## 2019-06-07 MED ORDER — VANCOMYCIN HCL 10 G IV SOLR
1500.0000 mg | INTRAVENOUS | Status: DC
  Filled 2019-06-07: qty 1500

## 2019-06-07 MED ORDER — EPINEPHRINE PF 1 MG/ML IJ SOLN
0.0000 ug/min | INTRAVENOUS | Status: DC
  Filled 2019-06-07: qty 4

## 2019-06-07 MED ORDER — MAGNESIUM SULFATE 50 % IJ SOLN
40.0000 meq | INTRAMUSCULAR | Status: DC
  Filled 2019-06-07 (×2): qty 9.85

## 2019-06-07 MED ORDER — PLASMA-LYTE 148 IV SOLN
INTRAVENOUS | Status: DC
  Filled 2019-06-07: qty 2.5

## 2019-06-07 MED ORDER — ALPRAZOLAM 0.25 MG PO TABS: 0.2500 mg | ORAL_TABLET | ORAL | Status: DC | PRN

## 2019-06-07 MED ORDER — TRANEXAMIC ACID 1000 MG/10ML IV SOLN
1.5000 mg/kg/h | INTRAVENOUS | Status: DC
  Filled 2019-06-07: qty 25

## 2019-06-07 MED ORDER — SODIUM CHLORIDE 0.9 % IV SOLN
1.5000 g | INTRAVENOUS | Status: DC
  Filled 2019-06-07: qty 1.5

## 2019-06-07 MED ORDER — TEMAZEPAM 7.5 MG PO CAPS
15.0000 mg | ORAL_CAPSULE | Freq: Once | ORAL | Status: AC | PRN
  Administered 2019-06-08: 15 mg via ORAL
  Filled 2019-06-07: qty 2

## 2019-06-07 MED ORDER — POTASSIUM CHLORIDE 2 MEQ/ML IV SOLN
80.0000 meq | INTRAVENOUS | Status: DC
  Filled 2019-06-07: qty 40

## 2019-06-07 MED ORDER — SODIUM CHLORIDE 0.9 % IV SOLN
750.0000 mg | INTRAVENOUS | Status: DC
  Filled 2019-06-07: qty 750

## 2019-06-07 MED ORDER — SODIUM CHLORIDE 0.9 % IV SOLN
INTRAVENOUS | Status: DC
  Filled 2019-06-07: qty 30

## 2019-06-07 MED ORDER — TRANEXAMIC ACID (OHS) PUMP PRIME SOLUTION
2.0000 mg/kg | INTRAVENOUS | Status: DC
  Filled 2019-06-07: qty 1.92

## 2019-06-07 MED ORDER — TRANEXAMIC ACID (OHS) BOLUS VIA INFUSION
15.0000 mg/kg | INTRAVENOUS | Status: DC
  Filled 2019-06-07: qty 1442

## 2019-06-07 MED ORDER — MILRINONE LACTATE IN DEXTROSE 20-5 MG/100ML-% IV SOLN
0.3000 ug/kg/min | INTRAVENOUS | Status: DC
  Filled 2019-06-07: qty 100

## 2019-06-07 MED ORDER — METOPROLOL TARTRATE 12.5 MG HALF TABLET
12.5000 mg | ORAL_TABLET | Freq: Once | ORAL | Status: AC
  Administered 2019-06-08: 12.5 mg via ORAL
  Filled 2019-06-07: qty 1

## 2019-06-07 MED ORDER — INSULIN REGULAR(HUMAN) IN NACL 100-0.9 UT/100ML-% IV SOLN
INTRAVENOUS | Status: DC
  Filled 2019-06-07: qty 100

## 2019-06-07 MED ORDER — DOPAMINE-DEXTROSE 3.2-5 MG/ML-% IV SOLN
0.0000 ug/kg/min | INTRAVENOUS | Status: DC
  Filled 2019-06-07: qty 250

## 2019-06-07 MED ORDER — NITROGLYCERIN IN D5W 200-5 MCG/ML-% IV SOLN
2.0000 ug/min | INTRAVENOUS | Status: DC
  Filled 2019-06-07: qty 250

## 2019-06-07 MED ORDER — BISACODYL 5 MG PO TBEC
5.0000 mg | DELAYED_RELEASE_TABLET | Freq: Once | ORAL | Status: AC
  Administered 2019-06-07: 5 mg via ORAL
  Filled 2019-06-07: qty 1

## 2019-06-07 MED ORDER — CHLORHEXIDINE GLUCONATE 0.12 % MT SOLN
15.0000 mL | Freq: Once | OROMUCOSAL | Status: AC
  Administered 2019-06-08: 15 mL via OROMUCOSAL
  Filled 2019-06-07: qty 15

## 2019-06-07 MED ORDER — PHENYLEPHRINE HCL-NACL 20-0.9 MG/250ML-% IV SOLN
30.0000 ug/min | INTRAVENOUS | Status: DC
  Filled 2019-06-07: qty 250

## 2019-06-07 MED ORDER — DEXMEDETOMIDINE HCL IN NACL 400 MCG/100ML IV SOLN
0.1000 ug/kg/h | INTRAVENOUS | Status: DC
  Filled 2019-06-07: qty 100

## 2019-06-07 MED ORDER — CHLORHEXIDINE GLUCONATE 4 % EX LIQD
60.0000 mL | Freq: Once | CUTANEOUS | Status: AC
  Administered 2019-06-07: 4 via TOPICAL
  Filled 2019-06-07: qty 60

## 2019-06-07 NOTE — Progress Notes (Signed)
Progress Note  Patient Name: [name] Date of Encounter: 06/07/2019  Primary Cardiologist: Mertie Moores, MD   Subjective   73 year old gentleman with a history of coronary artery disease.  He presented after having an out of hospital cardiac arrest.  He was resuscitated.  Heart catheterization revealed severe coronary disease including a very tight proximal LAD stenosis and significant circumflex disease.  The plan is for coronary artery bypass grafting on Thursday following Plavix washout.   the tenderness behind his right calf appers to be a ruptured Bakers cyst.  No evidence of DVT .   Inpatient Medications    Scheduled Meds:  aspirin EC  81 mg Oral q morning - 10a   atorvastatin  80 mg Oral Daily   bisacodyl  5 mg Oral Once   chlorhexidine  60 mL Topical Once   And   [START ON 06/08/2019] chlorhexidine  60 mL Topical Once   [START ON 06/08/2019] chlorhexidine  15 mL Mouth/Throat Once   famotidine  20 mg Oral Daily   [START ON 06/08/2019] metoprolol tartrate  12.5 mg Oral Once   metoprolol tartrate  25 mg Oral BID   sodium chloride flush  3 mL Intravenous Q12H   Continuous Infusions:  sodium chloride     sodium chloride     heparin 1,300 Units/hr (06/06/19 1356)   PRN Meds: sodium chloride, acetaminophen, ALPRAZolam, lip balm, nitroGLYCERIN, ondansetron (ZOFRAN) IV, sodium chloride flush, temazepam   Vital Signs    Vitals:   06/06/19 2301 06/06/19 2358 06/07/19 0437 06/07/19 0902  BP: (!) 158/96 (!) 149/84 110/62 120/68  Pulse: 89 80 80 77  Resp:  20 20 18   Temp:  98.2 F (36.8 C) 98.1 F (36.7 C) 99.4 F (37.4 C)  TempSrc:  Oral Oral Oral  SpO2:  99% 100% 99%  Weight:   96.1 kg   Height:        Intake/Output Summary (Last 24 hours) at 06/07/2019 1053 Last data filed at 06/06/2019 1844 Gross per 24 hour  Intake 112.73 ml  Output --  Net 112.73 ml   Last 3 Weights 06/07/2019 06/04/2019 03/02/2018  Weight (lbs) 211 lb 13.8 oz 203 lb 221 lb 6.4 oz   Weight (kg) 96.1 kg 92.08 kg 100.426 kg      Telemetry    nsr  - Personally Reviewed  ECG     nsr  - Personally Reviewed  Physical Exam   Physical Exam: Blood pressure 120/68, pulse 77, temperature 99.4 F (37.4 C), temperature source Oral, resp. rate 18, height 6\' 3"  (1.905 m), weight 96.1 kg, SpO2 99 %.  GEN:  Well nourished, well developed in no acute distress HEENT: Normal NECK: No JVD; No carotid bruits LYMPHATICS: No lymphadenopathy CARDIAC:  RR  RESPIRATORY:  Clear to auscultation without rales, wheezing or rhonchi  ABDOMEN: Soft, non-tender, non-distended MUSCULOSKELETAL:  No edema; No deformity , mild tenderness behind right calf SKIN: Warm and dry NEUROLOGIC:  Alert and oriented x 3   Labs    High Sensitivity Troponin:   Recent Labs  Lab 06/04/19 1445 06/04/19 1658 06/04/19 1850 06/04/19 2030 06/04/19 2323  TROPONINIHS 8 166* 358* 455* 1,003*      Cardiac EnzymesNo results for input(s): TROPONINI in the last 168 hours. No results for input(s): TROPIPOC in the last 168 hours.   Chemistry Recent Labs  Lab 06/04/19 1445 06/05/19 0427  NA 139 140  K 3.9 3.9  CL 109 111  CO2 14* 22  GLUCOSE  158* 113*  BUN 13 12  CREATININE 1.30* 1.17  CALCIUM 8.9 8.8*  PROT 6.3*  --   ALBUMIN 3.8  --   AST 81*  --   ALT 72*  --   ALKPHOS 57  --   BILITOT 1.0  --   GFRNONAA 54* >60  GFRAA >60 >60  ANIONGAP 16* 7     Hematology Recent Labs  Lab 06/05/19 0427 06/06/19 0246 06/07/19 0522  WBC 9.9 8.5 8.9  RBC 4.04* 3.83* 3.60*  HGB 13.4 12.3* 11.8*  HCT 38.6* 37.3* 35.1*  MCV 95.5 97.4 97.5  MCH 33.2 32.1 32.8  MCHC 34.7 33.0 33.6  RDW 13.2 13.2 12.9  PLT 133* 123* 120*    BNPNo results for input(s): BNP, PROBNP in the last 168 hours.   DDimer No results for input(s): DDIMER in the last 168 hours.   Radiology    Dg Chest Port 1 View  Result Date: 06/06/2019 CLINICAL DATA:  STEMI EXAM: PORTABLE CHEST 1 VIEW COMPARISON:  06/05/2019  FINDINGS: Cardiac shadows within normal limits. The lungs are clear bilaterally. No bony abnormality is noted. IMPRESSION: No active disease. Electronically Signed   By: Inez Catalina M.D.   On: 06/06/2019 07:10   Vas US Doppler Pre Cabg  Result Date: 06/06/2019 PREOPERATIVE VASCULAR EVALUATION  Indications:      Syncope and pre CABG. Risk Factors:     Hypertension, hyperlipidemia, coronary artery disease. Other Factors:    H/O PCI in 2009. Now having severe 3vessel CAD. Comparison Study: No previos study available for comparison Performing Technologist: Birdena Crandall, [place]  Examination Guidelines: A complete evaluation includes B-mode imaging, spectral Doppler, color Doppler, and power Doppler as needed of all accessible portions of each vessel. Bilateral testing is considered an integral part of a complete examination. Limited examinations for reoccurring indications may be performed as noted.  Right Carotid Findings: +----------+--------+--------+--------+---------------------+------------------+             PSV cm/s EDV cm/s Stenosis Describe              Comments            +----------+--------+--------+--------+---------------------+------------------+  CCA Prox   89       13                                      mild intimal wall                                                                changes             +----------+--------+--------+--------+---------------------+------------------+  CCA Distal 100      21                heterogenous          mild intimal wall  changes with mild                                                                plaque              +----------+--------+--------+--------+---------------------+------------------+  ICA Prox   289      85       60-79%   heterogenous and      moderate plaque                                            irregular             noted                +----------+--------+--------+--------+---------------------+------------------+  ICA Mid    93       24                                      mild intimal wall                                                                changes             +----------+--------+--------+--------+---------------------+------------------+  ICA Distal 88       25                                                          +----------+--------+--------+--------+---------------------+------------------+  ECA        105      4                 heterogenous          mild plaque at the                                                               origin              +----------+--------+--------+--------+---------------------+------------------+ Portions of this table do not appear on this page. +----------+--------+-------+--------+------------+             PSV cm/s EDV cms Describe Arm Pressure  +----------+--------+-------+--------+------------+  Subclavian [id number]                     132           +----------+--------+-------+--------+------------+ +---------+--------+--+--------+-+  Vertebral PSV cm/s 38 EDV cm/s 6  +---------+--------+--+--------+-+ Left Carotid Findings: +----------+--------+--------+--------+---------------------+------------------+  PSV cm/s EDV cm/s Stenosis Describe              Comments            +----------+--------+--------+--------+---------------------+------------------+  CCA Prox   89       17                                      mild intimal wall                                                                changes             +----------+--------+--------+--------+---------------------+------------------+  CCA Distal 104      24                                      mild intimal wall                                                                changes             +----------+--------+--------+--------+---------------------+------------------+  ICA Prox   87       21       1-39%    diffuse,  heterogenous mild plaque                                                and irregular                             +----------+--------+--------+--------+---------------------+------------------+  ICA Mid    114      36                                                          +----------+--------+--------+--------+---------------------+------------------+  ICA Distal 101      36                                                          +----------+--------+--------+--------+---------------------+------------------+  ECA        88       7                 heterogenous          mild plaque at the  origin              +----------+--------+--------+--------+---------------------+------------------+ +----------+--------+--------+--------+------------+  Subclavian PSV cm/s EDV cm/s Describe Arm Pressure  +----------+--------+--------+--------+------------+             163                        124           +----------+--------+--------+--------+------------+ +---------+--------+--+--------+--+  Vertebral PSV cm/s 54 EDV cm/s 13  +---------+--------+--+--------+--+  ABI Findings: +--------+------------------+-----+---------+--------+  Right    Rt Pressure (mmHg) Index Waveform  Comment   +--------+------------------+-----+---------+--------+  Brachial 132                      triphasic           +--------+------------------+-----+---------+--------+  PTA      148                1.12  triphasic           +--------+------------------+-----+---------+--------+  DP       145                1.10  triphasic           +--------+------------------+-----+---------+--------+ +--------+------------------+-----+---------+-------+  Left     Lt Pressure (mmHg) Index Waveform  Comment  +--------+------------------+-----+---------+-------+  Brachial 124                      triphasic          +--------+------------------+-----+---------+-------+  PTA      138                1.05   triphasic          +--------+------------------+-----+---------+-------+  DP       136                1.03  triphasic          +--------+------------------+-----+---------+-------+ +-------+---------------+----------------+  ABI/TBI Today's ABI/TBI Previous ABI/TBI  +-------+---------------+----------------+  Right   1.12            None available    +-------+---------------+----------------+  Left    1.05            None available    +-------+---------------+----------------+  Right Doppler Findings: +-----------+--------+-----+---------+--------+  Site        Pressure Index Doppler   Comments  +-----------+--------+-----+---------+--------+  Brachial    132            triphasic           +-----------+--------+-----+---------+--------+  Radial                     triphasic           +-----------+--------+-----+---------+--------+  Ulnar                      triphasic           +-----------+--------+-----+---------+--------+  Palmar Arch                          Normal    +-----------+--------+-----+---------+--------+  Left Doppler Findings: +-----------+--------+-----+---------+--------+  Site        Pressure Index Doppler   Comments  +-----------+--------+-----+---------+--------+  Brachial    124            triphasic           +-----------+--------+-----+---------+--------+  Radial                     triphasic           +-----------+--------+-----+---------+--------+  Ulnar                      triphasic           +-----------+--------+-----+---------+--------+  Palmar Arch                          Normal    +-----------+--------+-----+---------+--------+  Summary: Right Carotid: Velocities in the right ICA are consistent with a 60-79%                stenosis. Left Carotid: Velocities in the left ICA are consistent with a 1-39% stenosis. Vertebrals:  Bilateral vertebral arteries demonstrate antegrade flow. Subclavians: Normal flow hemodynamics were seen in bilateral subclavian              arteries. Right ABI:  Resting right ankle-brachial index is within normal range. No evidence of significant right lower extremity arterial disease. Left ABI: Resting left ankle-brachial index is within normal range. No evidence of significant left lower extremity arterial disease. Right Upper Extremity: Doppler waveforms remain within normal limits with right radial compression. Doppler waveforms remain within normal limits with right ulnar compression. Left Upper Extremity: Doppler waveforms remain within normal limits with left radial compression. Doppler waveforms remain within normal limits with left ulnar compression.  Electronically signed by Harold Barban MD on 06/06/2019 at 6:47:58 PM.    Final    Vas [place] Lower Extremity Venous (dvt)  Result Date: 06/06/2019  Lower Venous Study Indications: Pain, and Swelling.  Risk Factors: Pre CABG. Comparison Study: No comparison study available Performing Technologist: Toma Copier RVS  Examination Guidelines: A complete evaluation includes B-mode imaging, spectral Doppler, color Doppler, and power Doppler as needed of all accessible portions of each vessel. Bilateral testing is considered an integral part of a complete examination. Limited examinations for reoccurring indications may be performed as noted.  +---------+---------------+---------+-----------+----------+-------+  RIGHT     Compressibility Phasicity Spontaneity Properties Summary  +---------+---------------+---------+-----------+----------+-------+  CFV       Full            Yes       Yes                             +---------+---------------+---------+-----------+----------+-------+  SFJ       Full                                                      +---------+---------------+---------+-----------+----------+-------+  FV Prox   Full            Yes       Yes                             +---------+---------------+---------+-----------+----------+-------+  FV Mid    Full                                                       +---------+---------------+---------+-----------+----------+-------+  FV Distal Full            Yes       Yes                             +---------+---------------+---------+-----------+----------+-------+  PFV       Full            Yes       Yes                             +---------+---------------+---------+-----------+----------+-------+  POP       Full            Yes       Yes                             +---------+---------------+---------+-----------+----------+-------+  PTV       Full                                                      +---------+---------------+---------+-----------+----------+-------+  PERO      Full                                                      +---------+---------------+---------+-----------+----------+-------+   Right Technical Findings: thre is a complex struture located in the poplteal fossa measuring 3.74 cm x 2.30 cm consistent with a possible ruptured Baker's cyst. Further testing may be warranted.  +----+---------------+---------+-----------+----------+-------+  LEFT Compressibility Phasicity Spontaneity Properties Summary  +----+---------------+---------+-----------+----------+-------+  CFV  Full            Yes       Yes                             +----+---------------+---------+-----------+----------+-------+  SFJ  Full                                                      +----+---------------+---------+-----------+----------+-------+     Summary: Right: There is no evidence of deep vein thrombosis in the lower extremity. See technical findings listed above. Left: There is no evidence of a common femoral vein obstruction  *See table(s) above for measurements and observations. Electronically signed by Harold Barban MD on 06/06/2019 at 84:47:14 PM.    Final     Cardiac Studies     Patient Profile     73 y.o. male admitted with out of hospital cardiac arrest.  He is found to have significant coronary artery disease and is scheduled for bypass surgery.  Assessment  & Plan    Coronary artery disease:  For CABG to morrow  Doing well.   2.  Right calf tenderness.   Duplex scan is c/w ruptured Bakers cyst.   Hyperlipidemia: His current lipid levels include a cholesterol level 140.  The HDL is 56.  The LDL is 61.  Triglyceride levels 114. Continue atorvastatin 80 mg a day.   For questions or updates, please contact [place] Please consult [email or web address] for contact info under        Signed, Mertie Moores, MD  06/07/2019, 10:53 AM

## 2019-06-07 NOTE — Care Management Important Message (Signed)
Important Message  Patient Details  Name: DAWID DUPRIEST MRN: [id number] Date of Birth: 10-05-1946   Medicare Important Message Given:  Yes     Shelda Altes 06/07/2019, 11:49 AM

## 2019-06-07 NOTE — Progress Notes (Signed)
Pt stating that ice pack made leg feel better- will reapply

## 2019-06-07 NOTE — Progress Notes (Signed)
[id number] Came to see pt to see if any questions re ed done pre op.  Pt stated he has read a lot of [facility] booklet and has been using IS. Answered a few new questions he had. Pt has been walking to the bathroom with the rolling walker. Leg sore from Baker's cyst but he is comfortable in recliner and has been able to get around with walker. Will follow up after surgery. Graylon Good RN BSN 06/07/2019 11:56 AM

## 2019-06-07 NOTE — Progress Notes (Signed)
[place] for heparin Indication: chest pain/ACS  Allergies  Allergen Reactions  . Clarithromycin Other (See Comments)    Reaction=dizziness    Patient Measurements: Height: 6\' 3"  (190.5 cm) Weight: 211 lb 13.8 oz (96.1 kg) IBW/kg (Calculated) : 84.5 HEPARIN DW (KG): 92.1   Vital Signs: Temp: 98.1 F (36.7 C) (07/01 0437) Temp Source: Oral (07/01 0437) BP: 110/62 (07/01 0437) Pulse Rate: 80 (07/01 0437)  Labs: Recent Labs    06/04/19 1445  06/04/19 1850 06/04/19 2030 06/04/19 2323 06/05/19 0427  06/05/19 2045 06/06/19 0246 06/07/19 0522  HGB 14.5  --   --   --   --  13.4  --   --  12.3* 11.8*  HCT 43.2  --   --   --   --  38.6*  --   --  37.3* 35.1*  PLT 153  --   --   --   --  133*  --   --  123* 120*  APTT 27  --   --   --   --   --   --   --   --   --   LABPROT 14.6  --   --   --   --  15.2  --   --   --   --   INR 1.2  --   --   --   --  1.2  --   --   --   --   HEPARINUNFRC  --   --   --   --   --  0.35   < > 0.53 0.56 0.50  CREATININE 1.30*  --   --   --   --  1.17  --   --   --   --   TROPONINIHS 8   < > 358* 455* 1,003*  --   --   --   --   --    < > = values in this interval not displayed.    Estimated Creatinine Clearance: 66.2 mL/min (by C-G formula based on SCr of 1.17 mg/dL).   Assessment: 89 yom presented to the ED as a Code STEMI s/p cardiac arrest. Pt proceeded to cardiac cath where he was found to have severe CAD.CABG planned for 06/08/19 (but needs plavix washout period). He is not on anticoagulation PTA aside from plavix and aspirin 81mg .   Heparin level therapeutic CABG 7/2  Goal of Therapy:  Heparin level 0.3-0.7 units/ml Monitor platelets by anticoagulation protocol: Yes   Plan:  Continue heparin infusion at 1300 units/hr Daily heparin level and follow CBC  Thank you Anette Guarneri, PharmD [phone]   06/07/2019   8:51 AM

## 2019-06-07 NOTE — Telephone Encounter (Signed)
Per Dr. Acie Fredrickson, he will call patient's wife

## 2019-06-07 NOTE — Progress Notes (Signed)
Pt requesting pain medication for right calf pain- gave tylenol and placed ice pack on leg- will continue to monitor

## 2019-06-07 NOTE — Telephone Encounter (Signed)
I have called his wife and discussed the procedure. Will keep her informed during the hospitalization

## 2019-06-07 NOTE — Telephone Encounter (Signed)
New Message   Patients wife is calling on his behalf. She states that per her spouse he is to have surgery. Since she is not there she is not sure that he is providing her the information correctly so she is hoping that Dr. Acie Fredrickson or someone on his team can update her. She states that she has not spoken with anyone from the hospital. Please call to discuss.

## 2019-06-08 ENCOUNTER — Inpatient Hospital Stay (HOSPITAL_COMMUNITY): Payer: PPO | Admitting: Critical Care Medicine

## 2019-06-08 ENCOUNTER — Inpatient Hospital Stay (HOSPITAL_COMMUNITY): Payer: PPO

## 2019-06-08 ENCOUNTER — Inpatient Hospital Stay (HOSPITAL_COMMUNITY)
Admission: EM | Disposition: A | Discharge status: Home / Self Care | Payer: Self-pay | Source: Home / Self Care | Attending: Cardiothoracic Surgery

## 2019-06-08 ENCOUNTER — Encounter (HOSPITAL_COMMUNITY): Payer: Self-pay | Admitting: Critical Care Medicine

## 2019-06-08 DIAGNOSIS — I2510 Atherosclerotic heart disease of native coronary artery without angina pectoris: Secondary | ICD-10-CM

## 2019-06-08 HISTORY — PX: CORONARY ARTERY BYPASS GRAFT: SHX141

## 2019-06-08 HISTORY — PX: TEE WITHOUT CARDIOVERSION: SHX5443

## 2019-06-08 LAB — POCT I-STAT 7, (LYTES, BLD GAS, ICA,H+H)
Acid-Base Excess: 3 mmol/L — ABNORMAL HIGH (ref 0.0–2.0)
Acid-base deficit: 2 mmol/L (ref 0.0–2.0)
Acid-base deficit: 1 mmol/L (ref 0.0–2.0)
Acid-base deficit: 2 mmol/L (ref 0.0–2.0)
Acid-base deficit: 7 mmol/L — ABNORMAL HIGH (ref 0.0–2.0)
Acid-base deficit: 10 mmol/L — ABNORMAL HIGH (ref 0.0–2.0)
Bicarbonate: 27.1 mmol/L (ref 20.0–28.0)
Bicarbonate: 23.4 mmol/L (ref 20.0–28.0)
Bicarbonate: 24.3 mmol/L (ref 20.0–28.0)
Bicarbonate: 22.6 mmol/L (ref 20.0–28.0)
Bicarbonate: 16.8 mmol/L — ABNORMAL LOW (ref 20.0–28.0)
Bicarbonate: 14.7 mmol/L — ABNORMAL LOW (ref 20.0–28.0)
Calcium, Ion: 1.12 mmol/L — ABNORMAL LOW (ref 1.15–1.40)
Calcium, Ion: 1.13 mmol/L — ABNORMAL LOW (ref 1.15–1.40)
Calcium, Ion: 1.11 mmol/L — ABNORMAL LOW (ref 1.15–1.40)
Calcium, Ion: 1.25 mmol/L (ref 1.15–1.40)
Calcium, Ion: 1.18 mmol/L (ref 1.15–1.40)
Calcium, Ion: 1.1 mmol/L — ABNORMAL LOW (ref 1.15–1.40)
HCT: 26 % — ABNORMAL LOW (ref 39.0–52.0)
HCT: 23 % — ABNORMAL LOW (ref 39.0–52.0)
HCT: 26 % — ABNORMAL LOW (ref 39.0–52.0)
HCT: 26 % — ABNORMAL LOW (ref 39.0–52.0)
HCT: 26 % — ABNORMAL LOW (ref 39.0–52.0)
HCT: 21 % — ABNORMAL LOW (ref 39.0–52.0)
Hemoglobin: 8.8 g/dL — ABNORMAL LOW (ref 13.0–17.0)
Hemoglobin: 7.8 g/dL — ABNORMAL LOW (ref 13.0–17.0)
Hemoglobin: 8.8 g/dL — ABNORMAL LOW (ref 13.0–17.0)
Hemoglobin: 8.8 g/dL — ABNORMAL LOW (ref 13.0–17.0)
Hemoglobin: 8.8 g/dL — ABNORMAL LOW (ref 13.0–17.0)
Hemoglobin: 7.1 g/dL — ABNORMAL LOW (ref 13.0–17.0)
O2 Saturation: 100 %
O2 Saturation: 100 %
O2 Saturation: 100 %
O2 Saturation: 100 %
O2 Saturation: 100 %
O2 Saturation: 99 %
Patient temperature: 37.2
Patient temperature: 36.3
Patient temperature: 37.8
Potassium: 3.8 mmol/L (ref 3.5–5.1)
Potassium: 4.2 mmol/L (ref 3.5–5.1)
Potassium: 4.5 mmol/L (ref 3.5–5.1)
Potassium: 4.2 mmol/L (ref 3.5–5.1)
Potassium: 4 mmol/L (ref 3.5–5.1)
Potassium: 3.6 mmol/L (ref 3.5–5.1)
Sodium: 137 mmol/L (ref 135–145)
Sodium: 138 mmol/L (ref 135–145)
Sodium: 138 mmol/L (ref 135–145)
Sodium: 137 mmol/L (ref 135–145)
Sodium: 136 mmol/L (ref 135–145)
Sodium: 141 mmol/L (ref 135–145)
TCO2: 28 mmol/L (ref 22–32)
TCO2: 25 mmol/L (ref 22–32)
TCO2: 26 mmol/L (ref 22–32)
TCO2: 24 mmol/L (ref 22–32)
TCO2: 18 mmol/L — ABNORMAL LOW (ref 22–32)
TCO2: 15 mmol/L — ABNORMAL LOW (ref 22–32)
pCO2 arterial: 41.4 mmHg (ref 32.0–48.0)
pCO2 arterial: 42.6 mmHg (ref 32.0–48.0)
pCO2 arterial: 45.1 mmHg (ref 32.0–48.0)
pCO2 arterial: 37.4 mmHg (ref 32.0–48.0)
pCO2 arterial: 28 mmHg — ABNORMAL LOW (ref 32.0–48.0)
pCO2 arterial: 27.5 mmHg — ABNORMAL LOW (ref 32.0–48.0)
pH, Arterial: 7.425 (ref 7.350–7.450)
pH, Arterial: 7.349 — ABNORMAL LOW (ref 7.350–7.450)
pH, Arterial: 7.34 — ABNORMAL LOW (ref 7.350–7.450)
pH, Arterial: 7.385 (ref 7.350–7.450)
pH, Arterial: 7.386 (ref 7.350–7.450)
pH, Arterial: 7.339 — ABNORMAL LOW (ref 7.350–7.450)
pO2, Arterial: 388 mmHg — ABNORMAL HIGH (ref 83.0–108.0)
pO2, Arterial: 304 mmHg — ABNORMAL HIGH (ref 83.0–108.0)
pO2, Arterial: 447 mmHg — ABNORMAL HIGH (ref 83.0–108.0)
pO2, Arterial: 203 mmHg — ABNORMAL HIGH (ref 83.0–108.0)
pO2, Arterial: 170 mmHg — ABNORMAL HIGH (ref 83.0–108.0)
pO2, Arterial: 151 mmHg — ABNORMAL HIGH (ref 83.0–108.0)

## 2019-06-08 LAB — POCT I-STAT 4, (NA,K, GLUC, HGB,HCT)
Glucose, Bld: 116 mg/dL — ABNORMAL HIGH (ref 70–99)
Glucose, Bld: 111 mg/dL — ABNORMAL HIGH (ref 70–99)
Glucose, Bld: 106 mg/dL — ABNORMAL HIGH (ref 70–99)
Glucose, Bld: 136 mg/dL — ABNORMAL HIGH (ref 70–99)
Glucose, Bld: 149 mg/dL — ABNORMAL HIGH (ref 70–99)
Glucose, Bld: 143 mg/dL — ABNORMAL HIGH (ref 70–99)
Glucose, Bld: 118 mg/dL — ABNORMAL HIGH (ref 70–99)
HCT: 28 % — ABNORMAL LOW (ref 39.0–52.0)
HCT: 25 % — ABNORMAL LOW (ref 39.0–52.0)
HCT: 21 % — ABNORMAL LOW (ref 39.0–52.0)
HCT: 22 % — ABNORMAL LOW (ref 39.0–52.0)
HCT: 22 % — ABNORMAL LOW (ref 39.0–52.0)
HCT: 22 % — ABNORMAL LOW (ref 39.0–52.0)
HCT: 25 % — ABNORMAL LOW (ref 39.0–52.0)
Hemoglobin: 9.5 g/dL — ABNORMAL LOW (ref 13.0–17.0)
Hemoglobin: 8.5 g/dL — ABNORMAL LOW (ref 13.0–17.0)
Hemoglobin: 7.1 g/dL — ABNORMAL LOW (ref 13.0–17.0)
Hemoglobin: 7.5 g/dL — ABNORMAL LOW (ref 13.0–17.0)
Hemoglobin: 7.5 g/dL — ABNORMAL LOW (ref 13.0–17.0)
Hemoglobin: 7.5 g/dL — ABNORMAL LOW (ref 13.0–17.0)
Hemoglobin: 8.5 g/dL — ABNORMAL LOW (ref 13.0–17.0)
Potassium: 4.2 mmol/L (ref 3.5–5.1)
Potassium: 4 mmol/L (ref 3.5–5.1)
Potassium: 3.7 mmol/L (ref 3.5–5.1)
Potassium: 4.6 mmol/L (ref 3.5–5.1)
Potassium: 4.8 mmol/L (ref 3.5–5.1)
Potassium: 4.2 mmol/L (ref 3.5–5.1)
Potassium: 4.2 mmol/L (ref 3.5–5.1)
Sodium: 135 mmol/L (ref 135–145)
Sodium: 137 mmol/L (ref 135–145)
Sodium: 138 mmol/L (ref 135–145)
Sodium: 137 mmol/L (ref 135–145)
Sodium: 137 mmol/L (ref 135–145)
Sodium: 138 mmol/L (ref 135–145)
Sodium: 139 mmol/L (ref 135–145)

## 2019-06-08 LAB — GLUCOSE, CAPILLARY
Glucose-Capillary: 101 mg/dL — ABNORMAL HIGH (ref 70–99)
Glucose-Capillary: 108 mg/dL — ABNORMAL HIGH (ref 70–99)
Glucose-Capillary: 111 mg/dL — ABNORMAL HIGH (ref 70–99)
Glucose-Capillary: 112 mg/dL — ABNORMAL HIGH (ref 70–99)
Glucose-Capillary: 121 mg/dL — ABNORMAL HIGH (ref 70–99)
Glucose-Capillary: 122 mg/dL — ABNORMAL HIGH (ref 70–99)

## 2019-06-08 LAB — CBC
HCT: 27.2 % — ABNORMAL LOW (ref 39.0–52.0)
HCT: 28.5 % — ABNORMAL LOW (ref 39.0–52.0)
Hemoglobin: 9.2 g/dL — ABNORMAL LOW (ref 13.0–17.0)
Hemoglobin: 9.5 g/dL — ABNORMAL LOW (ref 13.0–17.0)
MCH: 33 pg (ref 26.0–34.0)
MCH: 32.5 pg (ref 26.0–34.0)
MCHC: 33.8 g/dL (ref 30.0–36.0)
MCHC: 33.3 g/dL (ref 30.0–36.0)
MCV: 97.5 fL (ref 80.0–100.0)
MCV: 97.6 fL (ref 80.0–100.0)
Platelets: 86 10*3/uL — ABNORMAL LOW (ref 150–400)
Platelets: 95 10*3/uL — ABNORMAL LOW (ref 150–400)
RBC: 2.79 MIL/uL — ABNORMAL LOW (ref 4.22–5.81)
RBC: 2.92 MIL/uL — ABNORMAL LOW (ref 4.22–5.81)
RDW: 12.8 % (ref 11.5–15.5)
RDW: 13.5 % (ref 11.5–15.5)
WBC: 17.4 10*3/uL — ABNORMAL HIGH (ref 4.0–10.5)
WBC: 12.3 10*3/uL — ABNORMAL HIGH (ref 4.0–10.5)
nRBC: 0 % (ref 0.0–0.2)
nRBC: 0 % (ref 0.0–0.2)

## 2019-06-08 LAB — POCT I-STAT, CHEM 8
BUN: 11 mg/dL (ref 8–23)
Calcium, Ion: 1.11 mmol/L — ABNORMAL LOW (ref 1.15–1.40)
Chloride: 111 mmol/L (ref 98–111)
Creatinine, Ser: 0.7 mg/dL (ref 0.61–1.24)
Glucose, Bld: 124 mg/dL — ABNORMAL HIGH (ref 70–99)
HCT: 24 % — ABNORMAL LOW (ref 39.0–52.0)
Hemoglobin: 8.2 g/dL — ABNORMAL LOW (ref 13.0–17.0)
Potassium: 3.7 mmol/L (ref 3.5–5.1)
Sodium: 139 mmol/L (ref 135–145)
TCO2: 17 mmol/L — ABNORMAL LOW (ref 22–32)

## 2019-06-08 LAB — BASIC METABOLIC PANEL
Anion gap: 10 (ref 5–15)
BUN: 13 mg/dL (ref 8–23)
CO2: 16 mmol/L — ABNORMAL LOW (ref 22–32)
Calcium: 7.4 mg/dL — ABNORMAL LOW (ref 8.9–10.3)
Chloride: 109 mmol/L (ref 98–111)
Creatinine, Ser: 0.89 mg/dL (ref 0.61–1.24)
GFR calc Af Amer: >60 mL/min (ref >60)
GFR calc non Af Amer: >60 mL/min (ref >60)
Glucose, Bld: 131 mg/dL — ABNORMAL HIGH (ref 70–99)
Potassium: 4 mmol/L (ref 3.5–5.1)
Sodium: 135 mmol/L (ref 135–145)

## 2019-06-08 LAB — HEMOGLOBIN AND HEMATOCRIT, BLOOD
HCT: 22.1 % — ABNORMAL LOW (ref 39.0–52.0)
Hemoglobin: 7.6 g/dL — ABNORMAL LOW (ref 13.0–17.0)

## 2019-06-08 LAB — PLATELET COUNT: Platelets: 94 10*3/uL — ABNORMAL LOW (ref 150–400)

## 2019-06-08 LAB — APTT: aPTT: 36 seconds (ref 24–36)

## 2019-06-08 LAB — PROTIME-INR
INR: 1.6 — ABNORMAL HIGH (ref 0.8–1.2)
Prothrombin Time: 19.1 seconds — ABNORMAL HIGH (ref 11.4–15.2)

## 2019-06-08 LAB — MAGNESIUM: Magnesium: 2.8 mg/dL — ABNORMAL HIGH (ref 1.7–2.4)

## 2019-06-08 SURGERY — CORONARY ARTERY BYPASS GRAFTING (CABG)
Anesthesia: General | Site: Chest

## 2019-06-08 MED ORDER — ORAL CARE MOUTH RINSE
15.0000 mL | OROMUCOSAL | Status: DC
  Administered 2019-06-08: 15 mL via OROMUCOSAL

## 2019-06-08 MED ORDER — HEPARIN SODIUM (PORCINE) 1000 UNIT/ML IJ SOLN
INTRAMUSCULAR | Status: AC
  Filled 2019-06-08: qty 1

## 2019-06-08 MED ORDER — SODIUM CHLORIDE 0.9% FLUSH: 3.0000 mL | INTRAVENOUS | Status: DC | PRN

## 2019-06-08 MED ORDER — HEMOSTATIC AGENTS (NO CHARGE) OPTIME
TOPICAL | Status: DC | PRN
  Administered 2019-06-08 (×2): 1 via TOPICAL

## 2019-06-08 MED ORDER — SODIUM CHLORIDE 0.9 % IV SOLN
INTRAVENOUS | Status: DC | PRN
  Administered 2019-06-08: 750 mg via INTRAVENOUS

## 2019-06-08 MED ORDER — CHLORHEXIDINE GLUCONATE 0.12% ORAL RINSE (MEDLINE KIT)
15.0000 mL | Freq: Two times a day (BID) | OROMUCOSAL | Status: DC
  Administered 2019-06-08: 15 mL via OROMUCOSAL

## 2019-06-08 MED ORDER — NITROGLYCERIN IN D5W 200-5 MCG/ML-% IV SOLN: 0.0000 ug/min | INTRAVENOUS | Status: DC

## 2019-06-08 MED ORDER — CALCIUM CHLORIDE 10 % IV SOLN
INTRAVENOUS | Status: DC | PRN
  Administered 2019-06-08 (×2): 100 mg via INTRAVENOUS

## 2019-06-08 MED ORDER — TRAMADOL HCL 50 MG PO TABS
50.0000 mg | ORAL_TABLET | ORAL | Status: DC | PRN
  Administered 2019-06-08 – 2019-06-11 (×7): 100 mg via ORAL
  Administered 2019-06-12: 50 mg via ORAL
  Filled 2019-06-08: qty 2
  Filled 2019-06-08: qty 1
  Filled 2019-06-08 (×6): qty 2

## 2019-06-08 MED ORDER — SODIUM CHLORIDE 0.9 % IV SOLN
INTRAVENOUS | Status: DC | PRN
  Administered 2019-06-08: 1 [IU]/h via INTRAVENOUS

## 2019-06-08 MED ORDER — OXYCODONE HCL 5 MG PO TABS
5.0000 mg | ORAL_TABLET | ORAL | Status: DC | PRN
  Administered 2019-06-10 – 2019-06-12 (×2): 5 mg via ORAL
  Administered 2019-06-13: 10 mg via ORAL
  Filled 2019-06-08 (×2): qty 1
  Filled 2019-06-08 (×2): qty 2

## 2019-06-08 MED ORDER — LACTATED RINGERS IV SOLN
INTRAVENOUS | Status: DC | PRN
  Administered 2019-06-08: 07:00:00 via INTRAVENOUS

## 2019-06-08 MED ORDER — VANCOMYCIN HCL 1000 MG IV SOLR
INTRAVENOUS | Status: DC | PRN
  Administered 2019-06-08: 1500 mg via INTRAVENOUS

## 2019-06-08 MED ORDER — ASPIRIN EC 325 MG PO TBEC
325.0000 mg | DELAYED_RELEASE_TABLET | Freq: Every day | ORAL | Status: DC
  Administered 2019-06-09 – 2019-06-14 (×6): 325 mg via ORAL
  Filled 2019-06-08 (×6): qty 1

## 2019-06-08 MED ORDER — SODIUM CHLORIDE 0.9 % IV SOLN
INTRAVENOUS | Status: DC | PRN
  Administered 2019-06-08: 50 ug/min via INTRAVENOUS

## 2019-06-08 MED ORDER — SODIUM CHLORIDE 0.9% FLUSH
10.0000 mL | Freq: Two times a day (BID) | INTRAVENOUS | Status: DC
  Administered 2019-06-09: 10 mL
  Administered 2019-06-11: 20 mL
  Administered 2019-06-12 – 2019-06-13 (×3): 10 mL

## 2019-06-08 MED ORDER — DOCUSATE SODIUM 100 MG PO CAPS
200.0000 mg | ORAL_CAPSULE | Freq: Every day | ORAL | Status: DC
  Administered 2019-06-09 – 2019-06-13 (×5): 200 mg via ORAL
  Filled 2019-06-08 (×6): qty 2

## 2019-06-08 MED ORDER — SODIUM CHLORIDE 0.9% FLUSH
3.0000 mL | Freq: Two times a day (BID) | INTRAVENOUS | Status: DC
  Administered 2019-06-09 – 2019-06-14 (×11): 3 mL via INTRAVENOUS

## 2019-06-08 MED ORDER — PROPOFOL 10 MG/ML IV BOLUS
INTRAVENOUS | Status: DC | PRN
  Administered 2019-06-08: 130 mg via INTRAVENOUS

## 2019-06-08 MED ORDER — SODIUM CHLORIDE 0.45 % IV SOLN: INTRAVENOUS | Status: DC | PRN

## 2019-06-08 MED ORDER — PANTOPRAZOLE SODIUM 40 MG PO TBEC
40.0000 mg | DELAYED_RELEASE_TABLET | Freq: Every day | ORAL | Status: DC
  Administered 2019-06-10 – 2019-06-14 (×5): 40 mg via ORAL
  Filled 2019-06-08 (×5): qty 1

## 2019-06-08 MED ORDER — ACETAMINOPHEN 650 MG RE SUPP
650.0000 mg | Freq: Once | RECTAL | Status: AC
  Administered 2019-06-08: 16:00:00 650 mg via RECTAL

## 2019-06-08 MED ORDER — SODIUM CHLORIDE 0.9% FLUSH: 10.0000 mL | INTRAVENOUS | Status: DC | PRN

## 2019-06-08 MED ORDER — SODIUM CHLORIDE (PF) 0.9 % IJ SOLN
OROMUCOSAL | Status: DC | PRN
  Administered 2019-06-08 (×3): 4 mL via TOPICAL

## 2019-06-08 MED ORDER — FENTANYL CITRATE (PF) 250 MCG/5ML IJ SOLN
INTRAMUSCULAR | Status: AC
  Filled 2019-06-08: qty 25

## 2019-06-08 MED ORDER — SUCCINYLCHOLINE CHLORIDE 200 MG/10ML IV SOSY
PREFILLED_SYRINGE | INTRAVENOUS | Status: DC | PRN
  Administered 2019-06-08: 120 mg via INTRAVENOUS

## 2019-06-08 MED ORDER — BISACODYL 5 MG PO TBEC
10.0000 mg | DELAYED_RELEASE_TABLET | Freq: Every day | ORAL | Status: DC
  Administered 2019-06-09 – 2019-06-13 (×5): 10 mg via ORAL
  Filled 2019-06-08 (×6): qty 2

## 2019-06-08 MED ORDER — SODIUM CHLORIDE 0.9 % IV SOLN
INTRAVENOUS | Status: DC | PRN
  Administered 2019-06-08: 13:00:00 via INTRAVENOUS

## 2019-06-08 MED ORDER — SUCCINYLCHOLINE CHLORIDE 200 MG/10ML IV SOSY
PREFILLED_SYRINGE | INTRAVENOUS | Status: AC
  Filled 2019-06-08: qty 10

## 2019-06-08 MED ORDER — CHLORHEXIDINE GLUCONATE CLOTH 2 % EX PADS
6.0000 | MEDICATED_PAD | Freq: Every day | CUTANEOUS | Status: DC
  Administered 2019-06-08 – 2019-06-11 (×4): 6 via TOPICAL

## 2019-06-08 MED ORDER — FAMOTIDINE IN NACL 20-0.9 MG/50ML-% IV SOLN
20.0000 mg | Freq: Two times a day (BID) | INTRAVENOUS | Status: AC
  Administered 2019-06-08: 15:00:00 20 mg via INTRAVENOUS

## 2019-06-08 MED ORDER — INSULIN ASPART 100 UNIT/ML ~~LOC~~ SOLN
0.0000 [IU] | SUBCUTANEOUS | Status: DC
  Administered 2019-06-08 – 2019-06-09 (×6): 2 [IU] via SUBCUTANEOUS
  Administered 2019-06-09: 16:00:00 4 [IU] via SUBCUTANEOUS
  Administered 2019-06-10 (×3): 2 [IU] via SUBCUTANEOUS

## 2019-06-08 MED ORDER — ASPIRIN 81 MG PO CHEW: 324.0000 mg | CHEWABLE_TABLET | Freq: Every day | ORAL | Status: DC

## 2019-06-08 MED ORDER — PROPOFOL 10 MG/ML IV BOLUS
INTRAVENOUS | Status: AC
  Filled 2019-06-08: qty 20

## 2019-06-08 MED ORDER — ONDANSETRON HCL 4 MG/2ML IJ SOLN
4.0000 mg | Freq: Four times a day (QID) | INTRAMUSCULAR | Status: DC | PRN
  Administered 2019-06-09 – 2019-06-11 (×4): 4 mg via INTRAVENOUS
  Filled 2019-06-08 (×5): qty 2

## 2019-06-08 MED ORDER — ROCURONIUM BROMIDE 10 MG/ML (PF) SYRINGE
PREFILLED_SYRINGE | INTRAVENOUS | Status: AC
  Filled 2019-06-08: qty 40

## 2019-06-08 MED ORDER — VANCOMYCIN HCL IN DEXTROSE 1-5 GM/200ML-% IV SOLN
1000.0000 mg | Freq: Once | INTRAVENOUS | Status: AC
  Administered 2019-06-08: 1000 mg via INTRAVENOUS
  Filled 2019-06-08: qty 200

## 2019-06-08 MED ORDER — FENTANYL CITRATE (PF) 250 MCG/5ML IJ SOLN
INTRAMUSCULAR | Status: DC | PRN
  Administered 2019-06-08 (×2): 100 ug via INTRAVENOUS
  Administered 2019-06-08: 200 ug via INTRAVENOUS
  Administered 2019-06-08: 50 ug via INTRAVENOUS
  Administered 2019-06-08 (×3): 100 ug via INTRAVENOUS
  Administered 2019-06-08: 50 ug via INTRAVENOUS
  Administered 2019-06-08: 150 ug via INTRAVENOUS
  Administered 2019-06-08: 100 ug via INTRAVENOUS
  Administered 2019-06-08 (×5): 50 ug via INTRAVENOUS
  Administered 2019-06-08: 100 ug via INTRAVENOUS

## 2019-06-08 MED ORDER — ORAL CARE MOUTH RINSE
15.0000 mL | Freq: Two times a day (BID) | OROMUCOSAL | Status: DC
  Administered 2019-06-08 – 2019-06-10 (×3): 15 mL via OROMUCOSAL

## 2019-06-08 MED ORDER — MILRINONE LACTATE IN DEXTROSE 20-5 MG/100ML-% IV SOLN
0.1250 ug/kg/min | INTRAVENOUS | Status: DC
  Administered 2019-06-08: 21:00:00 0.25 ug/kg/min via INTRAVENOUS
  Administered 2019-06-09 – 2019-06-11 (×2): 0.125 ug/kg/min via INTRAVENOUS
  Filled 2019-06-08 (×4): qty 100

## 2019-06-08 MED ORDER — PHENYLEPHRINE HCL-NACL 20-0.9 MG/250ML-% IV SOLN
0.0000 ug/min | INTRAVENOUS | Status: DC
  Administered 2019-06-08: 60 ug/min via INTRAVENOUS
  Administered 2019-06-08: 30 ug/min via INTRAVENOUS
  Filled 2019-06-08: qty 250

## 2019-06-08 MED ORDER — SODIUM CHLORIDE (PF) 0.9 % IJ SOLN
INTRAMUSCULAR | Status: AC
  Filled 2019-06-08: qty 10

## 2019-06-08 MED ORDER — ACETAMINOPHEN 160 MG/5ML PO SOLN: 650.0000 mg | Freq: Once | ORAL | Status: AC

## 2019-06-08 MED ORDER — PROTAMINE SULFATE 10 MG/ML IV SOLN
INTRAVENOUS | Status: DC | PRN
  Administered 2019-06-08: 300 mg via INTRAVENOUS
  Administered 2019-06-08: 30 mg via INTRAVENOUS

## 2019-06-08 MED ORDER — DEXMEDETOMIDINE HCL IN NACL 400 MCG/100ML IV SOLN
INTRAVENOUS | Status: DC | PRN
  Administered 2019-06-08: 0.7 ug/kg/h via INTRAVENOUS

## 2019-06-08 MED ORDER — SODIUM CHLORIDE 0.9 % IV SOLN
1.5000 g | Freq: Two times a day (BID) | INTRAVENOUS | Status: AC
  Administered 2019-06-08 – 2019-06-10 (×4): 1.5 g via INTRAVENOUS
  Filled 2019-06-08 (×5): qty 1.5

## 2019-06-08 MED ORDER — POTASSIUM CHLORIDE 10 MEQ/50ML IV SOLN: 10.0000 meq | INTRAVENOUS | Status: AC

## 2019-06-08 MED ORDER — ARTIFICIAL TEARS OPHTHALMIC OINT
TOPICAL_OINTMENT | OPHTHALMIC | Status: DC | PRN
  Administered 2019-06-08: 1 via OPHTHALMIC

## 2019-06-08 MED ORDER — HEPARIN SODIUM (PORCINE) 1000 UNIT/ML IJ SOLN
INTRAMUSCULAR | Status: DC | PRN
  Administered 2019-06-08 (×2): 2000 [IU] via INTRAVENOUS
  Administered 2019-06-08: 26000 [IU] via INTRAVENOUS

## 2019-06-08 MED ORDER — 0.9 % SODIUM CHLORIDE (POUR BTL) OPTIME
TOPICAL | Status: DC | PRN
  Administered 2019-06-08: 1000 mL

## 2019-06-08 MED ORDER — ROCURONIUM BROMIDE 10 MG/ML (PF) SYRINGE
PREFILLED_SYRINGE | INTRAVENOUS | Status: DC | PRN
  Administered 2019-06-08 (×3): 100 mg via INTRAVENOUS

## 2019-06-08 MED ORDER — INSULIN REGULAR BOLUS VIA INFUSION
0.0000 [IU] | Freq: Three times a day (TID) | INTRAVENOUS | Status: DC
  Filled 2019-06-08: qty 10

## 2019-06-08 MED ORDER — MIDAZOLAM HCL (PF) 10 MG/2ML IJ SOLN
INTRAMUSCULAR | Status: AC
  Filled 2019-06-08: qty 2

## 2019-06-08 MED ORDER — BISACODYL 10 MG RE SUPP: 10.0000 mg | Freq: Every day | RECTAL | Status: DC

## 2019-06-08 MED ORDER — METOPROLOL TARTRATE 25 MG/10 ML ORAL SUSPENSION
12.5000 mg | Freq: Two times a day (BID) | ORAL | Status: DC
  Administered 2019-06-08: 12.5 mg
  Filled 2019-06-08: qty 5

## 2019-06-08 MED ORDER — SODIUM CHLORIDE 0.9 % IV SOLN: 250.0000 mL | INTRAVENOUS | Status: DC

## 2019-06-08 MED ORDER — PLASMA-LYTE 148 IV SOLN
INTRAVENOUS | Status: DC | PRN
  Administered 2019-06-08: 500 mL via INTRAVASCULAR

## 2019-06-08 MED ORDER — SODIUM CHLORIDE 0.9 % IV SOLN
INTRAVENOUS | Status: DC
  Administered 2019-06-08 (×3): via INTRAVENOUS

## 2019-06-08 MED ORDER — PHENYLEPHRINE 40 MCG/ML (10ML) SYRINGE FOR IV PUSH (FOR BLOOD PRESSURE SUPPORT)
PREFILLED_SYRINGE | INTRAVENOUS | Status: AC
  Filled 2019-06-08: qty 20

## 2019-06-08 MED ORDER — MIDAZOLAM HCL 2 MG/2ML IJ SOLN: 2.0000 mg | INTRAMUSCULAR | Status: DC | PRN

## 2019-06-08 MED ORDER — LIDOCAINE 2% (20 MG/ML) 5 ML SYRINGE
INTRAMUSCULAR | Status: DC | PRN
  Administered 2019-06-08: 100 mg via INTRAVENOUS

## 2019-06-08 MED ORDER — LACTATED RINGERS IV SOLN
INTRAVENOUS | Status: DC
  Administered 2019-06-08: 18:00:00 via INTRAVENOUS

## 2019-06-08 MED ORDER — ARTIFICIAL TEARS OPHTHALMIC OINT
TOPICAL_OINTMENT | OPHTHALMIC | Status: AC
  Filled 2019-06-08: qty 3.5

## 2019-06-08 MED ORDER — FENTANYL CITRATE (PF) 250 MCG/5ML IJ SOLN
INTRAMUSCULAR | Status: AC
  Filled 2019-06-08: qty 5

## 2019-06-08 MED ORDER — NITROGLYCERIN IN D5W 200-5 MCG/ML-% IV SOLN
INTRAVENOUS | Status: DC | PRN
  Administered 2019-06-08: 5 ug/min via INTRAVENOUS

## 2019-06-08 MED ORDER — MIDAZOLAM HCL 5 MG/5ML IJ SOLN
INTRAMUSCULAR | Status: DC | PRN
  Administered 2019-06-08: 1 mg via INTRAVENOUS
  Administered 2019-06-08: 3 mg via INTRAVENOUS
  Administered 2019-06-08 (×2): 1 mg via INTRAVENOUS
  Administered 2019-06-08: 4 mg via INTRAVENOUS

## 2019-06-08 MED ORDER — METOCLOPRAMIDE HCL 5 MG/ML IJ SOLN
10.0000 mg | Freq: Four times a day (QID) | INTRAMUSCULAR | Status: AC
  Administered 2019-06-08 – 2019-06-09 (×3): 10 mg via INTRAVENOUS
  Filled 2019-06-08 (×4): qty 2

## 2019-06-08 MED ORDER — PHENYLEPHRINE 40 MCG/ML (10ML) SYRINGE FOR IV PUSH (FOR BLOOD PRESSURE SUPPORT)
PREFILLED_SYRINGE | INTRAVENOUS | Status: DC | PRN
  Administered 2019-06-08 (×2): 80 ug via INTRAVENOUS
  Administered 2019-06-08: 40 ug via INTRAVENOUS
  Administered 2019-06-08 (×3): 80 ug via INTRAVENOUS
  Administered 2019-06-08: 40 ug via INTRAVENOUS
  Administered 2019-06-08: 20 ug via INTRAVENOUS
  Administered 2019-06-08: 40 ug via INTRAVENOUS
  Administered 2019-06-08: 20 ug via INTRAVENOUS
  Administered 2019-06-08: 40 ug via INTRAVENOUS
  Administered 2019-06-08: 20 ug via INTRAVENOUS

## 2019-06-08 MED ORDER — DEXMEDETOMIDINE HCL IN NACL 200 MCG/50ML IV SOLN: 0.0000 ug/kg/h | INTRAVENOUS | Status: DC

## 2019-06-08 MED ORDER — LACTATED RINGERS IV SOLN: 500.0000 mL | Freq: Once | INTRAVENOUS | Status: DC | PRN

## 2019-06-08 MED ORDER — TRANEXAMIC ACID 1000 MG/10ML IV SOLN
INTRAVENOUS | Status: DC | PRN
  Administered 2019-06-08: 1.5 mg/kg/h via INTRAVENOUS

## 2019-06-08 MED ORDER — MORPHINE SULFATE (PF) 2 MG/ML IV SOLN
1.0000 mg | INTRAVENOUS | Status: DC | PRN
  Administered 2019-06-08 – 2019-06-09 (×3): 2 mg via INTRAVENOUS
  Filled 2019-06-08 (×3): qty 1

## 2019-06-08 MED ORDER — MILRINONE LACTATE IN DEXTROSE 20-5 MG/100ML-% IV SOLN
INTRAVENOUS | Status: DC | PRN
  Administered 2019-06-08: 0.25 ug/kg/min via INTRAVENOUS

## 2019-06-08 MED ORDER — CHLORHEXIDINE GLUCONATE 0.12 % MT SOLN
15.0000 mL | OROMUCOSAL | Status: AC
  Administered 2019-06-08: 15 mL via OROMUCOSAL

## 2019-06-08 MED ORDER — SODIUM CHLORIDE 0.9 % IV SOLN
INTRAVENOUS | Status: DC | PRN
  Administered 2019-06-08: 1.5 g via INTRAVENOUS

## 2019-06-08 MED ORDER — ALBUMIN HUMAN 5 % IV SOLN
INTRAVENOUS | Status: DC | PRN
  Administered 2019-06-08 (×2): via INTRAVENOUS

## 2019-06-08 MED ORDER — MAGNESIUM SULFATE 4 GM/100ML IV SOLN
4.0000 g | Freq: Once | INTRAVENOUS | Status: AC
  Administered 2019-06-08: 4 g via INTRAVENOUS
  Filled 2019-06-08: qty 100

## 2019-06-08 MED ORDER — ACETAMINOPHEN 160 MG/5ML PO SOLN: 1000.0000 mg | Freq: Four times a day (QID) | ORAL | Status: AC

## 2019-06-08 MED ORDER — INSULIN REGULAR(HUMAN) IN NACL 100-0.9 UT/100ML-% IV SOLN: INTRAVENOUS | Status: DC

## 2019-06-08 MED ORDER — METOPROLOL TARTRATE 5 MG/5ML IV SOLN: 2.5000 mg | INTRAVENOUS | Status: DC | PRN

## 2019-06-08 MED ORDER — SODIUM CHLORIDE 0.9 % IV SOLN
20.0000 ug | Freq: Once | INTRAVENOUS | Status: AC
  Administered 2019-06-08: 13:00:00 20 ug via INTRAVENOUS
  Filled 2019-06-08: qty 5

## 2019-06-08 MED ORDER — ACETAMINOPHEN 500 MG PO TABS
1000.0000 mg | ORAL_TABLET | Freq: Four times a day (QID) | ORAL | Status: AC
  Administered 2019-06-08 – 2019-06-13 (×18): 1000 mg via ORAL
  Filled 2019-06-08 (×18): qty 2

## 2019-06-08 MED ORDER — ALBUMIN HUMAN 5 % IV SOLN
250.0000 mL | INTRAVENOUS | Status: AC | PRN
  Administered 2019-06-08: 12.5 g via INTRAVENOUS

## 2019-06-08 MED ORDER — LACTATED RINGERS IV SOLN: INTRAVENOUS | Status: DC

## 2019-06-08 MED ORDER — METOPROLOL TARTRATE 12.5 MG HALF TABLET
12.5000 mg | ORAL_TABLET | Freq: Two times a day (BID) | ORAL | Status: DC
  Administered 2019-06-09 – 2019-06-11 (×5): 12.5 mg via ORAL
  Filled 2019-06-08 (×6): qty 1

## 2019-06-08 SURGICAL SUPPLY — 101 items
ADAPTER CARDIO PERF ANTE/RETRO (ADAPTER) ×4 IMPLANT
BAG DECANTER FOR FLEXI CONT (MISCELLANEOUS) ×4 IMPLANT
BANDAGE ACE 4X5 VEL STRL LF (GAUZE/BANDAGES/DRESSINGS) ×4 IMPLANT
BANDAGE ACE 6X5 VEL STRL LF (GAUZE/BANDAGES/DRESSINGS) ×4 IMPLANT
BANDAGE ELASTIC 4 VELCRO ST LF (GAUZE/BANDAGES/DRESSINGS) ×1 IMPLANT
BANDAGE ELASTIC 6 VELCRO ST LF (GAUZE/BANDAGES/DRESSINGS) ×1 IMPLANT
BASKET HEART  (ORDER IN 25'S) (MISCELLANEOUS) ×1
BASKET HEART (ORDER IN 25'S) (MISCELLANEOUS) ×1
BASKET HEART (ORDER IN 25S) (MISCELLANEOUS) ×2 IMPLANT
BLADE CLIPPER SURG (BLADE) ×2 IMPLANT
BLADE MINI RND TIP GREEN BEAV (BLADE) ×2 IMPLANT
BLADE STERNUM SYSTEM 6 (BLADE) ×4 IMPLANT
BLADE SURG 11 STRL SS (BLADE) ×2 IMPLANT
BLADE SURG 12 STRL SS (BLADE) ×4 IMPLANT
BNDG GAUZE ELAST 4 BULKY (GAUZE/BANDAGES/DRESSINGS) ×4 IMPLANT
CANISTER SUCT 3000ML PPV (MISCELLANEOUS) ×4 IMPLANT
CANNULA AORTIC ROOT 9FR (CANNULA) ×2 IMPLANT
CANNULA GUNDRY RCSP 15FR (MISCELLANEOUS) ×4 IMPLANT
CATH CPB KIT VANTRIGT (MISCELLANEOUS) ×4 IMPLANT
CATH ROBINSON RED A/P 18FR (CATHETERS) ×12 IMPLANT
CATH THORACIC 28FR RT ANG (CATHETERS) ×2 IMPLANT
CATH THORACIC 36FR RT ANG (CATHETERS) ×4 IMPLANT
COVER WAND RF STERILE (DRAPES) ×2 IMPLANT
DERMABOND ADHESIVE PROPEN (GAUZE/BANDAGES/DRESSINGS) ×2
DERMABOND ADVANCED .7 DNX6 (GAUZE/BANDAGES/DRESSINGS) IMPLANT
DRAIN CHANNEL 32F RND 10.7 FF (WOUND CARE) ×4 IMPLANT
DRAPE CARDIOVASCULAR INCISE (DRAPES) ×2
DRAPE SLUSH/WARMER DISC (DRAPES) ×4 IMPLANT
DRAPE SRG 135X102X78XABS (DRAPES) ×2 IMPLANT
DRSG AQUACEL AG ADV 3.5X14 (GAUZE/BANDAGES/DRESSINGS) ×4 IMPLANT
ELECT BLADE 4.0 EZ CLEAN MEGAD (MISCELLANEOUS) ×4
ELECT BLADE 6.5 EXT (BLADE) ×4 IMPLANT
ELECT CAUTERY BLADE 6.4 (BLADE) ×4 IMPLANT
ELECT REM PT RETURN 9FT ADLT (ELECTROSURGICAL) ×8
ELECTRODE BLDE 4.0 EZ CLN MEGD (MISCELLANEOUS) ×2 IMPLANT
ELECTRODE REM PT RTRN 9FT ADLT (ELECTROSURGICAL) ×4 IMPLANT
FELT TEFLON 1X6 (MISCELLANEOUS) ×6 IMPLANT
GAUZE SPONGE 4X4 12PLY STRL (GAUZE/BANDAGES/DRESSINGS) ×8 IMPLANT
GLOVE BIO SURGEON STRL SZ7.5 (GLOVE) ×12 IMPLANT
GOWN STRL REUS W/ TWL LRG LVL3 (GOWN DISPOSABLE) ×8 IMPLANT
GOWN STRL REUS W/TWL LRG LVL3 (GOWN DISPOSABLE) ×16
HEMOSTAT POWDER SURGIFOAM 1G (HEMOSTASIS) ×12 IMPLANT
HEMOSTAT SURGICEL 2X14 (HEMOSTASIS) ×4 IMPLANT
INSERT FOGARTY XLG (MISCELLANEOUS) IMPLANT
KIT BASIN OR (CUSTOM PROCEDURE TRAY) ×4 IMPLANT
KIT SUCTION CATH 14FR (SUCTIONS) ×4 IMPLANT
KIT TURNOVER KIT B (KITS) ×4 IMPLANT
KIT VASOVIEW HEMOPRO 2 VH 4000 (KITS) ×4 IMPLANT
LEAD PACING MYOCARDI (MISCELLANEOUS) ×4 IMPLANT
MARKER GRAFT CORONARY BYPASS (MISCELLANEOUS) ×10 IMPLANT
NDL SUT 1 .5 CRC FRENCH EYE (NEEDLE) IMPLANT
NEEDLE FRENCH EYE (NEEDLE) ×2
NS IRRIG 1000ML POUR BTL (IV SOLUTION) ×20 IMPLANT
PACK E OPEN HEART (SUTURE) ×4 IMPLANT
PACK OPEN HEART (CUSTOM PROCEDURE TRAY) ×4 IMPLANT
PAD ARMBOARD 7.5X6 YLW CONV (MISCELLANEOUS) ×8 IMPLANT
PAD ELECT DEFIB RADIOL ZOLL (MISCELLANEOUS) ×4 IMPLANT
PENCIL BUTTON HOLSTER BLD 10FT (ELECTRODE) ×4 IMPLANT
POSITIONER HEAD DONUT 9IN (MISCELLANEOUS) ×4 IMPLANT
PUNCH AORTIC ROTATE  4.5MM 8IN (MISCELLANEOUS) ×2 IMPLANT
PUNCH AORTIC ROTATE 4.0MM (MISCELLANEOUS) IMPLANT
PUNCH AORTIC ROTATE 4.5MM 8IN (MISCELLANEOUS) IMPLANT
PUNCH AORTIC ROTATE 5MM 8IN (MISCELLANEOUS) IMPLANT
SET CARDIOPLEGIA MPS 5001102 (MISCELLANEOUS) ×2 IMPLANT
SOLUTION ANTI FOG 6CC (MISCELLANEOUS) ×2 IMPLANT
SPONGE LAP 4X18 RFD (DISPOSABLE) ×2 IMPLANT
SURGIFLO W/THROMBIN 8M KIT (HEMOSTASIS) ×4 IMPLANT
SUT BONE WAX W31G (SUTURE) ×4 IMPLANT
SUT MNCRL AB 4-0 PS2 18 (SUTURE) ×2 IMPLANT
SUT PROLENE 3 0 RB 1 (SUTURE) ×2 IMPLANT
SUT PROLENE 3 0 SH DA (SUTURE) ×2 IMPLANT
SUT PROLENE 3 0 SH1 36 (SUTURE) IMPLANT
SUT PROLENE 4 0 RB 1 (SUTURE) ×8
SUT PROLENE 4 0 SH DA (SUTURE) ×6 IMPLANT
SUT PROLENE 4-0 RB1 .5 CRCL 36 (SUTURE) ×2 IMPLANT
SUT PROLENE 5 0 C 1 36 (SUTURE) ×6 IMPLANT
SUT PROLENE 6 0 C 1 30 (SUTURE) ×6 IMPLANT
SUT PROLENE 6 0 CC (SUTURE) ×14 IMPLANT
SUT PROLENE 8 0 BV175 6 (SUTURE) ×4 IMPLANT
SUT PROLENE BLUE 7 0 (SUTURE) ×6 IMPLANT
SUT SILK  1 MH (SUTURE)
SUT SILK 1 MH (SUTURE) IMPLANT
SUT SILK 2 0 SH CR/8 (SUTURE) ×2 IMPLANT
SUT SILK 3 0 SH CR/8 (SUTURE) IMPLANT
SUT STEEL 6MS V (SUTURE) ×6 IMPLANT
SUT STEEL SZ 6 DBL 3X14 BALL (SUTURE) ×6 IMPLANT
SUT VIC AB 1 CTX 36 (SUTURE) ×4
SUT VIC AB 1 CTX36XBRD ANBCTR (SUTURE) ×4 IMPLANT
SUT VIC AB 2-0 CT1 27 (SUTURE) ×2
SUT VIC AB 2-0 CT1 TAPERPNT 27 (SUTURE) IMPLANT
SUT VIC AB 2-0 CTX 27 (SUTURE) IMPLANT
SUT VIC AB 3-0 X1 27 (SUTURE) IMPLANT
SYSTEM SAHARA CHEST DRAIN ATS (WOUND CARE) ×4 IMPLANT
TAPE CLOTH SOFT 2X10 (GAUZE/BANDAGES/DRESSINGS) ×2 IMPLANT
TAPE CLOTH SURG 4X10 WHT LF (GAUZE/BANDAGES/DRESSINGS) ×2 IMPLANT
TOWEL GREEN STERILE (TOWEL DISPOSABLE) ×4 IMPLANT
TOWEL GREEN STERILE FF (TOWEL DISPOSABLE) ×4 IMPLANT
TRAY FOLEY SLVR 16FR TEMP STAT (SET/KITS/TRAYS/PACK) ×4 IMPLANT
TUBING LAP HI FLOW INSUFFLATIO (TUBING) ×4 IMPLANT
UNDERPAD 30X30 (UNDERPADS AND DIAPERS) ×4 IMPLANT
WATER STERILE IRR 1000ML POUR (IV SOLUTION) ×8 IMPLANT

## 2019-06-08 NOTE — Progress Notes (Signed)
Per Respiratory Therapist ABG results: pH 7.66, pC02 below reportable range, p02 124 and BICARB 19.5.  Cardiology paged with this information.  Order received to redraw ABG  STAT per Dr. Elson Areas.  Respiratory Therapist notified of STAT order.

## 2019-06-08 NOTE — Anesthesia Procedure Notes (Signed)
Central Venous Catheter Insertion Performed by: Annye Asa, MD, anesthesiologist Start/End7/01/2019 6:34 AM, 06/08/2019 6:48 AM Patient location: Pre-op. Preanesthetic checklist: patient identified, IV checked, risks and benefits discussed, surgical consent, monitors and equipment checked, pre-op evaluation, timeout performed and anesthesia consent Position: supine Lidocaine 1% used for infiltration and patient sedated Hand hygiene performed , maximum sterile barriers used  and Seldinger technique used Catheter size: 8.5 Fr PA cath was placed.Sheath introducer Swan type:thermodilution Procedure performed using ultrasound guided technique. Ultrasound Notes:anatomy identified, needle tip was noted to be adjacent to the nerve/plexus identified, no ultrasound evidence of intravascular and/or intraneural injection and image(s) printed for medical record Attempts: 1 Following insertion, line sutured, dressing applied and Biopatch. Post procedure assessment: blood return through all ports, free fluid flow and no air  Patient tolerated the procedure well with no immediate complications. Additional procedure comments: PA catheter:  Routine monitors. Timeout, sterile prep, drape, FBP R neck.  Supine position.  1% Lido local, finder and trocar RIJ 1st pass with US guidance.  Cordis placed over J wire. PA catheter in easily.  Sterile dressing applied.  Patient tolerated well, VSS.  Jenita Seashore, MD.

## 2019-06-08 NOTE — ED Provider Notes (Signed)
Received care from Dr. Rex Kras. Please see her note for prior hx and physical exam.  Patient on my exam awake, able to report he was working prior to having a syncopal episode and denies any current symptoms.  Concern given his cardiac history and ECG changes that this was acute ischemic event or arrhythmia. Dr. Angelena Form at bedside and will take him to the cath lab for further care. CTs without bleed or acute cervical spine fracture. Updated patient's wife in the consultation room.    Samuel Morgan, MD 06/08/19 1038

## 2019-06-08 NOTE — Brief Op Note (Signed)
06/04/2019 - 06/08/2019  5:56 PM  PATIENT:  Marcelyn Ditty  73 y.o. male  PRE-OPERATIVE DIAGNOSIS:  CAD  POST-OPERATIVE DIAGNOSIS:  CAD  PROCEDURE:  Procedure(s):  CORONARY ARTERY BYPASS GRAFTING x 3 -LIMA to LAD -SVG to OM2 -SVG to RAMUS INTERMEDIATE   ENDOSCOPIC HARVEST OF GREATER SAPHENOUS VEIN -Left Thigh  TRANSESOPHAGEAL ECHOCARDIOGRAM (TEE) (N/A)  SURGEON:  Surgeon(s) and Role:    Ivin Poot, MD - Primary  PHYSICIAN ASSISTANT: Ellwood Handler PA-C  ANESTHESIA:   general  EBL:  550 mL   BLOOD ADMINISTERED: CELLSAVER  DRAINS: Left Pleural Chest Tube, Mediastinal Chest Drains   LOCAL MEDICATIONS USED:  NONE  SPECIMEN:  No Specimen  DISPOSITION OF SPECIMEN:  N/A  COUNTS:  YES  TOURNIQUET:  * No tourniquets in log *  DICTATION: .Dragon Dictation  PLAN OF CARE: Admit to inpatient   PATIENT DISPOSITION:  ICU - intubated and hemodynamically stable.   Delay start of Pharmacological VTE agent (>24hrs) due to surgical blood loss or risk of bleeding: yes

## 2019-06-08 NOTE — Anesthesia Preprocedure Evaluation (Addendum)
Anesthesia Evaluation  Patient identified by MRN, date of birth, ID band Patient awake    Reviewed: Allergy & Precautions, NPO status , Patient's Chart, lab work & pertinent test results  Airway Mallampati: II  TM Distance: >3 FB     Dental   Pulmonary    breath sounds clear to auscultation       Cardiovascular hypertension, + CAD and + Past MI   Rhythm:Regular Rate:Normal     Neuro/Psych    GI/Hepatic Neg liver ROS, GERD  ,  Endo/Other  negative endocrine ROS  Renal/GU negative Renal ROS     Musculoskeletal   Abdominal   Peds  Hematology   Anesthesia Other Findings   Reproductive/Obstetrics                             Anesthesia Physical Anesthesia Plan  ASA: III  Anesthesia Plan: General   Post-op Pain Management:    Induction: Intravenous  PONV Risk Score and Plan: 2 and Ondansetron and Dexamethasone  Airway Management Planned: Oral ETT  Additional Equipment:   Intra-op Plan:   Post-operative Plan: Post-operative intubation/ventilation  Informed Consent: I have reviewed the patients History and Physical, chart, labs and discussed the procedure including the risks, benefits and alternatives for the proposed anesthesia with the patient or authorized representative who has indicated his/her understanding and acceptance.     Dental advisory given  Plan Discussed with: CRNA and Anesthesiologist  Anesthesia Plan Comments:        Anesthesia Quick Evaluation

## 2019-06-08 NOTE — Anesthesia Procedure Notes (Addendum)
Procedure Name: Intubation Date/Time: 06/08/2019 7:48 AM Performed by: Wilburn Cornelia, CRNA Pre-anesthesia Checklist: Patient identified, Emergency Drugs available, Suction available and Patient being monitored Patient Re-evaluated:Patient Re-evaluated prior to induction Oxygen Delivery Method: Circle System Utilized Preoxygenation: Pre-oxygenation with 100% oxygen Induction Type: IV induction Ventilation: Mask ventilation without difficulty Laryngoscope Size: Mac and 4 Grade View: Grade II Tube type: Oral Tube size: 8.0 mm Number of attempts: 1 Airway Equipment and Method: Stylet and Oral airway Placement Confirmation: ETT inserted through vocal cords under direct vision,  positive ETCO2 and breath sounds checked- equal and bilateral Secured at: 24 cm Tube secured with: Tape Dental Injury: Teeth and Oropharynx as per pre-operative assessment

## 2019-06-08 NOTE — Procedures (Signed)
Extubation Procedure Note  Patient Details:   Name: [name] DOB: 1946-06-20 MRN: [id number]   Airway Documentation:  AIRWAYS (Active)   Vent end date: 06/08/19 Vent end time: 1940   Evaluation  O2 sats: stable throughout Complications: No apparent complications Patient did tolerate procedure well. Bilateral Breath Sounds: Clear, Diminished   Yes   RT extubated patient to 4L [place] with RN at bedside per MD order and rapid wean protocol. Positive cuff leak noted. NIF -22 and VC 1300. Patient tolerated well. No stridor noted. RT worked with patient on incentive and pt achieved about 675ml. RT will continue to monitor as needed.   Vernona Rieger 06/08/2019, 7:55 PM

## 2019-06-08 NOTE — Progress Notes (Signed)
     [address]       [place] [address]             [phone]       Doing well.  HD stable.  Waking up appropriately On milr and neo  Vitals:   06/08/19 1835 06/08/19 1836  BP:  (!) 161/65  Pulse:  97  Resp:    Temp:    SpO2: 100%    CBC    Component Value Date/Time   WBC 17.4 (H) 06/08/2019 1433   RBC 2.79 (L) 06/08/2019 1433   HGB 8.8 (L) 06/08/2019 1456   HCT 26.0 (L) 06/08/2019 1456   PLT 86 (L) 06/08/2019 1433   MCV 97.5 06/08/2019 1433   MCH 33.0 06/08/2019 1433   MCHC 33.8 06/08/2019 1433   RDW 12.8 06/08/2019 1433   LYMPHSABS 2.9 06/04/2019 1445   MONOABS 1.0 06/04/2019 1445   EOSABS 0.2 06/04/2019 1445   BASOSABS 0.1 06/04/2019 1445   BMP Latest Ref Rng & Units 06/08/2019 06/08/2019 06/08/2019  Glucose 70 - 99 mg/dL - 118(H) -  BUN 8 - 23 mg/dL - - -  Creatinine 0.61 - 1.24 mg/dL - - -  BUN/Creat Ratio 10 - 24 - - -  Sodium 135 - 145 mmol/L 137 139 138  Potassium 3.5 - 5.1 mmol/L 4.2 4.2 4.5  Chloride 98 - 111 mmol/L - - -  CO2 22 - 32 mmol/L - - -  Calcium 8.9 - 10.3 mg/dL - - -    Intake/Output Summary (Last 24 hours) at 06/08/2019 1910 Last data filed at 06/08/2019 1800 Gross per 24 hour  Intake 5005.19 ml  Output 1356 ml  Net 3649.19 ml

## 2019-06-08 NOTE — Transfer of Care (Signed)
Immediate Anesthesia Transfer of Care Note  Patient: [name]  Procedure(s) Performed: CORONARY ARTERY BYPASS GRAFTING (CABG), ON PUMP, TIMES THREE, USING LEFT INTERNAL MAMMARY ARTERY AND ENDOSCOPICALLY HARVESTED LEFT GREATER SAPHENOUS VEIN (N/A Chest) TRANSESOPHAGEAL ECHOCARDIOGRAM (TEE) (N/A )  Patient Location: SICU  Anesthesia Type:General  Level of Consciousness: Patient remains intubated per anesthesia plan  Airway & Oxygen Therapy: Patient remains intubated per anesthesia plan and Patient placed on Ventilator (see vital sign flow sheet for setting)  Post-op Assessment: Report given to RN and Post -op Vital signs reviewed and stable  Post vital signs: Reviewed and stable  Last Vitals:  Vitals Value Taken Time  BP    Temp    Pulse    Resp    SpO2    HR 88, bP 110/58, sats 94% on vent  Last Pain:  Vitals:   06/08/19 0523  TempSrc: Oral  PainSc:       Patients Stated Pain Goal: 0 (15/17/61 6073)  Complications: No apparent anesthesia complications

## 2019-06-08 NOTE — Progress Notes (Signed)
Echocardiogram Echocardiogram Transesophageal has been performed.  [name] [name] 06/08/2019, 8:56 AM

## 2019-06-08 NOTE — Anesthesia Procedure Notes (Signed)
Arterial Line Insertion Start/End7/01/2019 6:50 AM, 06/08/2019 7:00 AM Performed by: Josephine Igo, CRNA, CRNA  Patient location: Pre-op. Preanesthetic checklist: patient identified, IV checked, site marked, risks and benefits discussed, surgical consent, monitors and equipment checked, pre-op evaluation, timeout performed and anesthesia consent Lidocaine 1% used for infiltration and patient sedated Right, radial was placed Catheter size: 20 G Hand hygiene performed  and maximum sterile barriers used  Allen's test indicative of satisfactory collateral circulation Attempts: 3 Procedure performed without using ultrasound guided technique. Following insertion, dressing applied and Biopatch. Post procedure assessment: normal

## 2019-06-08 NOTE — Progress Notes (Signed)
Pre Procedure note for inpatients:   [name] has been scheduled for Procedure(s): CORONARY ARTERY BYPASS GRAFTING (CABG) (N/A) TRANSESOPHAGEAL ECHOCARDIOGRAM (TEE) (N/A) today. The various methods of treatment have been discussed with the patient. After consideration of the risks, benefits and treatment options the patient has consented to the planned procedure.   The patient has been seen and labs reviewed. There are no changes in the patient's condition to prevent proceeding with the planned procedure today.  Recent labs:  Lab Results  Component Value Date   WBC 8.9 06/07/2019   HGB 11.8 (L) 06/07/2019   HCT 35.1 (L) 06/07/2019   PLT 120 (L) 06/07/2019   GLUCOSE 113 (H) 06/05/2019   CHOL 140 06/04/2019   TRIG 114 06/04/2019   HDL 56 06/04/2019   LDLCALC 61 06/04/2019   ALT 72 (H) 06/04/2019   AST 81 (H) 06/04/2019   NA 140 06/05/2019   K 3.9 06/05/2019   CL 111 06/05/2019   CREATININE 1.17 06/05/2019   BUN 12 06/05/2019   CO2 22 06/05/2019   TSH 2.029 06/05/2019   INR 1.2 06/05/2019   HGBA1C 5.6 06/05/2019    Len Childs, MD 06/08/2019 7:32 AM

## 2019-06-09 ENCOUNTER — Inpatient Hospital Stay (HOSPITAL_COMMUNITY): Payer: PPO

## 2019-06-09 DIAGNOSIS — Z951 Presence of aortocoronary bypass graft: Secondary | ICD-10-CM

## 2019-06-09 LAB — GLUCOSE, CAPILLARY
Glucose-Capillary: 129 mg/dL — ABNORMAL HIGH (ref 70–99)
Glucose-Capillary: 140 mg/dL — ABNORMAL HIGH (ref 70–99)
Glucose-Capillary: 150 mg/dL — ABNORMAL HIGH (ref 70–99)
Glucose-Capillary: 152 mg/dL — ABNORMAL HIGH (ref 70–99)
Glucose-Capillary: 152 mg/dL — ABNORMAL HIGH (ref 70–99)
Glucose-Capillary: 170 mg/dL — ABNORMAL HIGH (ref 70–99)

## 2019-06-09 LAB — CBC
HCT: 27.6 % — ABNORMAL LOW (ref 39.0–52.0)
HCT: 24.6 % — ABNORMAL LOW (ref 39.0–52.0)
Hemoglobin: 9.3 g/dL — ABNORMAL LOW (ref 13.0–17.0)
Hemoglobin: 8 g/dL — ABNORMAL LOW (ref 13.0–17.0)
MCH: 32.6 pg (ref 26.0–34.0)
MCH: 32.3 pg (ref 26.0–34.0)
MCHC: 33.7 g/dL (ref 30.0–36.0)
MCHC: 32.5 g/dL (ref 30.0–36.0)
MCV: 96.8 fL (ref 80.0–100.0)
MCV: 99.2 fL (ref 80.0–100.0)
Platelets: 96 10*3/uL — ABNORMAL LOW (ref 150–400)
Platelets: 87 10*3/uL — ABNORMAL LOW (ref 150–400)
RBC: 2.85 MIL/uL — ABNORMAL LOW (ref 4.22–5.81)
RBC: 2.48 MIL/uL — ABNORMAL LOW (ref 4.22–5.81)
RDW: 13.7 % (ref 11.5–15.5)
RDW: 13.9 % (ref 11.5–15.5)
WBC: 15.2 10*3/uL — ABNORMAL HIGH (ref 4.0–10.5)
WBC: 13.8 10*3/uL — ABNORMAL HIGH (ref 4.0–10.5)
nRBC: 0 % (ref 0.0–0.2)
nRBC: 0 % (ref 0.0–0.2)

## 2019-06-09 LAB — BASIC METABOLIC PANEL
Anion gap: 7 (ref 5–15)
Anion gap: 8 (ref 5–15)
BUN: 21 mg/dL (ref 8–23)
BUN: 22 mg/dL (ref 8–23)
CO2: 19 mmol/L — ABNORMAL LOW (ref 22–32)
CO2: 22 mmol/L (ref 22–32)
Calcium: 7.7 mg/dL — ABNORMAL LOW (ref 8.9–10.3)
Calcium: 8 mg/dL — ABNORMAL LOW (ref 8.9–10.3)
Chloride: 109 mmol/L (ref 98–111)
Chloride: 105 mmol/L (ref 98–111)
Creatinine, Ser: 1.05 mg/dL (ref 0.61–1.24)
Creatinine, Ser: 1.33 mg/dL — ABNORMAL HIGH (ref 0.61–1.24)
GFR calc Af Amer: >60 mL/min (ref >60)
GFR calc Af Amer: >60 mL/min (ref >60)
GFR calc non Af Amer: >60 mL/min (ref >60)
GFR calc non Af Amer: 52 mL/min — ABNORMAL LOW (ref >60)
Glucose, Bld: 155 mg/dL — ABNORMAL HIGH (ref 70–99)
Glucose, Bld: 181 mg/dL — ABNORMAL HIGH (ref 70–99)
Potassium: 4.2 mmol/L (ref 3.5–5.1)
Potassium: 4.4 mmol/L (ref 3.5–5.1)
Sodium: 135 mmol/L (ref 135–145)
Sodium: 135 mmol/L (ref 135–145)

## 2019-06-09 LAB — MAGNESIUM
Magnesium: 2.6 mg/dL — ABNORMAL HIGH (ref 1.7–2.4)
Magnesium: 2.9 mg/dL — ABNORMAL HIGH (ref 1.7–2.4)

## 2019-06-09 MED ORDER — INSULIN ASPART 100 UNIT/ML ~~LOC~~ SOLN: 0.0000 [IU] | SUBCUTANEOUS | Status: DC

## 2019-06-09 MED ORDER — FUROSEMIDE 10 MG/ML IJ SOLN
20.0000 mg | Freq: Two times a day (BID) | INTRAMUSCULAR | Status: DC
  Administered 2019-06-09 – 2019-06-11 (×4): 20 mg via INTRAVENOUS
  Filled 2019-06-09 (×4): qty 2

## 2019-06-09 MED ORDER — SODIUM BICARBONATE 8.4 % IV SOLN
50.0000 meq | Freq: Once | INTRAVENOUS | Status: AC
  Administered 2019-06-09: 50 meq via INTRAVENOUS
  Filled 2019-06-09: qty 50

## 2019-06-09 NOTE — Op Note (Signed)
NAME: [name], [name] MEDICAL RECORD [id number] ACCOUNT [id number] DATE OF BIRTH:[birth year] FACILITY: [facility] LOCATION: [facility] PHYSICIAN:Nolen Lindamood VAN TRIGT III, MD  OPERATIVE REPORT  DATE OF PROCEDURE:  06/08/2019  OPERATION: 1.  Coronary artery bypass grafting x3 (left internal mammary artery to left anterior descending, saphenous vein graft to ramus intermedius, saphenous vein graft to circumflex marginal). 2.  Endoscopic harvest of left leg greater saphenous vein.  SURGEON:  Len Childs, MD  ASSISTANT:  Ellwood Handler, PA-C  ANESTHESIA:  General by Belinda Block, MD  PREOPERATIVE DIAGNOSES: 1.  Acute non-ST-elevation myocardial infarction with syncope. 2.  Severe 3-vessel coronary artery disease.  POSTOPERATIVE DIAGNOSES: 1.  Acute non-ST-elevation myocardial infarction with syncope. 2.  Severe 3-vessel coronary artery disease.  DESCRIPTION OF PROCEDURE:  The patient was brought from preop holding where informed consent was documented and final questions addressed.  The patient was brought back to the operating room, placed supine on the operating table, and general anesthesia  was induced.  The patient remained stable.  A transesophageal echo probe was placed by the anesthesia team.  The patient was prepped and draped as a sterile field.  A proper time-out was performed.  Previously, I had reviewed the arteriograms and echo with the patient and discussed the procedure of CABG including the use of general anesthesia, cardiopulmonary bypass, and the location of the surgical incisions.  I discussed with the patient the  expected benefits of improved survival and preserved LV function as well as the potential risks of stroke, bleeding, blood transfusion, postoperative infection, postoperative organ failure, and death.  He demonstrated his understanding and agreed to  proceed with surgery.  A sternal incision was made as the saphenous vein was harvested from the left leg  endoscopically.  The left internal mammary artery was harvested.  It was a 1.5 mm vessel with good flow.  The sternal retractor was placed and the pericardium opened and  suspended.  Pursestrings were placed in the ascending aorta and right atrium, and after the ACT was documented as being therapeutic following heparin, the patient was cannulated and placed on bypass.  The coronaries were identified for grafting.  The  distal LAD, mid ramus and distal circumflex marginal were found to be adequate targets.  The mammary artery and vein grafts were prepared for the distal anastomoses.  Cardioplegic cannulas were placed both antegrade and retrograde cold blood  cardioplegia.  The patient was cooled to 32 degrees, and the aortic cross-clamp was applied.  One liter of cold blood cardioplegia was delivered in split doses between the antegrade aortic and retrograde coronary sinus catheters.  There was good  cardioplegic arrest, and supple temperature dropped less than 12 degrees.  Cardioplegia was delivered every 20 minutes.  The distal coronary anastomoses were performed.  The first distal anastomosis was the distal circumflex.  It was a 1.5 mm vessel with proximal 80% stenosis.  Reverse saphenous vein was sewn end-to-side with running 7-0 Prolene with good flow through the  graft.  Cardioplegia was redosed.  A second distal anastomosis was the ramus intermedius branch of the left coronary.  It was a 1.5 mm vessel, proximal 80% stenosis.  Reverse saphenous vein was sewn end-to-side with running 7-0 Prolene.  Cardioplegia was redosed.  The third distal anastomosis was the mid to distal third LAD.  There was a proximal 99% ostial stenosis.  The left IMA pedicle was brought through an opening in the left lateral pericardium and was brought down onto the LAD and  sewn end-to-side with  running 8-0 Prolene.  There was good flow through the anastomosis after briefly releasing the pedicle bulldog and the mammary  artery.  The bulldog was reapplied, and the pedicle was secured to the epicardium.  Cardioplegia was redosed.  While the cross-clamp was still in place, 2 proximal vein anastomoses were performed on the ascending aorta using a 4.5 mm punch and running 6-0 Prolene.  Prior to tying down the final proximal anastomosis, air was vented from the coronaries with a dose  of retrograde warm blood cardioplegia.  The cross-clamp was removed.  The heart resumed a spontaneous rhythm.  The vein grafts were deaired and opened, and each had good flow.  Hemostasis was documented at the proximal and distal anastomoses.  The patient was rewarmed and reperfused.  Temporary pacing wires were applied.   The lungs were expanded and the ventilator resumed.  The patient was weaned from cardiopulmonary bypass on low-dose milrinone without difficulty.  Echo showed preserved LV systolic function, and hemodynamics were stable and normal.  The patient was given protamine without adverse reaction.  The cannulas were removed.  The mediastinum was irrigated.  There was some diffuse coagulopathy, which was treated with cauterization and topical thrombin static agents.  The superior pericardial  fat was closed over the aorta.  The anterior mediastinal and posterior mediastinal and left pleural chest tubes were placed.  The sternum was closed with wire.  The patient remained stable.  The pectoralis fascia was closed with a running #1 Vicryl.   Subcutaneous and skin layers were closed with running Vicryl, and sterile dressings were applied.  Total cardiopulmonary bypass time was 120 minutes.  LN/NUANCE  D:06/09/2019 T:06/09/2019 JOB:[id number]

## 2019-06-09 NOTE — Discharge Summary (Addendum)
Physician Discharge Summary  Patient ID: [name] MRN: [id number] DOB/AGE: Jan 18, 1946 73 y.o.  Admit date: 06/04/2019 Discharge date: 06/14/2019  Admission Diagnoses:  Patient Active Problem List   Diagnosis Date Noted  . 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 Cardiac arrest ([place]) 06/04/2019  . Non-ST elevation (NSTEMI) myocardial infarction ([place])   . 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 CAD (coronary artery disease) 03/31/2011  . Hyperlipemia 03/31/2011   Discharge Diagnoses:   Patient Active Problem List   Diagnosis Date Noted  . 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 Cardiac arrest ([place]) 06/04/2019  . Non-ST elevation (NSTEMI) myocardial infarction ([place])   . 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 CAD (coronary artery disease) 03/31/2011  . Hyperlipemia 03/31/2011   Discharged Condition: good  History of Present Illness:  [name] is a 73 yo white male non smoker who collapsed at work.  He required CPR with intermittent return of conciousness.  When EMS arrived the patient was conscious with pulse.  Upon arrival to the ED he underwent CT scan of the head and neck which was negative for fracture.  He has known history of PCI to the Ramus in 2009 by Dr. Acie Griffin.  He has been on Plavix since that time.  The patient has had no symptoms since that time.  He underwent cardiac catheterization which showed multivessel CAD with a preserved LV function.  It was felt coronary bypass grafting would be indicated.  He was admitted to the hospital for further care.   Hospital Course:   He remained chest pain free during his hospitalization.  He was evaluated by Dr. Prescott Griffin who was in agreement the patient would require coronary bypass surgery.  However due to plavix he would require to wait a few days prior to proceeding.  The risks and benefits of the procedure were explained to the patient and he was agreeable to proceed.  He ruptured a bakers cyst in his right leg with subsequent swelling.  He was taken to the operating room  and underwent CABG x 3 utilizing LIMA to LAD, SVG to OM 2, and SVG to Ramus Intermediate.  He also underwent endoscopic harvest of greater saphenous vein from his left leg.  He tolerated the procedure without difficulty and was taken to the SICU in stable condition.  He was weaned off Neo-synephrine as tolerated.  Her chest tubes and arterial lines were removed without difficulty.  He developed dizziness with ambulation.  His hemoglobin level dropped to 7.5.  He was transfused 1 unit of packed cells with improvement in symptoms.  Follow up hemoglobin level was 8.6.  He was maintaining NSR and stable for transfer to the progressive care unit on 06/12/2019.  His pacing wires were removed without difficulty.  He was mildly tachycardic and his Lopressor dose was titrated accordingly.  He was hypokalemic and supplemented accordingly.  He is ambulating independently.  His incisions are healing without evidence of infection.  He is medically stable for discharge home today.     Significant Diagnostic Studies: angiography:    Mid Cx lesion is 70% stenosed.  Ramus-1 lesion is 10% stenosed.  Ramus-2 lesion is 70% stenosed.  Prox LAD lesion is 99% stenosed.  There is mild left ventricular systolic dysfunction.  The left ventricular ejection fraction is 45-50% by visual estimate.   1. Severe triple vessel CAD  Treatments: surgery:   1.  Coronary artery bypass grafting x3 (left internal mammary artery to left anterior descending, saphenous vein  graft to ramus intermedius, saphenous vein graft to circumflex marginal). 2.  Endoscopic harvest of left leg greater saphenous vein.  Discharge Exam: Blood pressure (!) 148/78, pulse 96, temperature 98.7 F (37.1 C), temperature source Oral, resp. rate 19, height 6\' 2"  (1.88 m), weight 99.4 kg, SpO2 97 %.  General appearance: alert, cooperative and no distress Heart: regular rate and rhythm Lungs: clear to auscultation bilaterally Abdomen: soft, non-tender;  bowel sounds normal; no masses,  no organomegaly Extremities: edema trace Wound: clean and dry  Discharge disposition: 01-Home or Self Care     Discharge Medications:  The patient has been discharged on:   1.Beta Blocker:  Yes [ X  ]                              No   [   ]                              If No, reason:  2.Ace Inhibitor/ARB: Yes [ x  ]                                     No  [    ]                                     If No, reason:  3.Statin:   Yes [ X  ]                  No  [   ]                  If No, reason:  4.Ecasa:  Yes  [ X  ]                  No   [   ]                  If No, reason:      Allergies as of 06/14/2019      Reactions   Clarithromycin Other (See Comments)   Reaction=dizziness      Medication List    STOP taking these medications   nitroGLYCERIN 0.4 MG SL tablet Commonly known as: NITROSTAT     TAKE these medications   aspirin EC 81 MG tablet Take 81 mg by mouth every morning.   atorvastatin 80 MG tablet Commonly known as: LIPITOR Take 1 tablet (80 mg total) by mouth daily at 6 PM. What changed:   medication strength  how much to take  when to take this   B COMPLEX-C-E PO Take 1 tablet by mouth daily.   clopidogrel 75 MG tablet Commonly known as: PLAVIX Take 1 tablet (75 mg total) by mouth every morning.   diphenhydrAMINE 25 MG tablet Commonly known as: BENADRYL Take 25 mg by mouth every morning.   famotidine 20 MG tablet Commonly known as: PEPCID Take 20 mg by mouth daily.   FLONASE NA Place 1 spray into the nose as needed (allergies). For sinus infection   furosemide 40 MG tablet Commonly known as: LASIX Take 1 tablet (40 mg total) by mouth daily.   losartan 25 MG tablet Commonly known as: COZAAR Take 25  mg by mouth daily.   metoprolol tartrate 25 MG tablet Commonly known as: LOPRESSOR TAKE 1 TABLET BY MOUTH TWICE A DAY   oxyCODONE 5 MG immediate release tablet Commonly known as: Oxy  IR/ROXICODONE Take 1-2 tablets (5-10 mg total) by mouth every 4 (four) hours as needed for severe pain.   potassium chloride SA 20 MEQ tablet Commonly known as: K-DUR Take 2 tablets (40 mEq total) by mouth daily. For 7 days   vitamin B-12 1000 MCG tablet Commonly known as: CYANOCOBALAMIN Take 1,000 mcg by mouth daily.   zinc gluconate 50 MG tablet Take 25 mg by mouth daily.      Follow-up Information    Ivin Poot, MD Follow up on 07/12/2019.   Specialty: Cardiothoracic Surgery Why: Appointment is at 11:30, please get CXR at 11:00 at [facility] located on first floor of our office building Contact information: [place] [address] [phone]        Daune Perch, NP Follow up on 06/26/2019.   Specialty: Cardiology Why: Please arrive 15 minutes early for your 9:30am appointment  Contact information: [address] [place] [address] [phone]        Lorene Dy, MD Follow up.   Specialty: Internal Medicine Why: Please contact to have lab work drawn this Friday, for repeat Potassium level Contact information: [place], [place] [address] [phone]        Nahser, Wonda Cheng, MD .   Specialty: Cardiology Contact information: [place]. [address] [place] [address] [phone]           Signed:  Ellwood Handler 06/14/2019, 7:42 AM  patient examined and medical record reviewed,agree with above note. [name] 06/14/2019

## 2019-06-09 NOTE — Progress Notes (Signed)
Patient unable to ambulate today due to feeling nauseated and dizzy while standing up. Sat up in chair for 4.5 hours - tolerated well.   Joellen Jersey, RN

## 2019-06-09 NOTE — Progress Notes (Signed)
      [address]       [place] [address]             [phone]      POD # 1 CABG  Resting comfortably  BP 110/65   Pulse (!) 102   Temp 97.9 F (36.6 C) (Oral)   Resp 16   Ht 6\' 2"  (1.88 m)   Wt 102.3 kg   SpO2 97%   BMI 28.96 kg/m   Intake/Output Summary (Last 24 hours) at 06/09/2019 1657 Last data filed at 06/09/2019 1610 Gross per 24 hour  Intake 1629.52 ml  Output 1314 ml  Net 315.52 ml   On milrinone 0.125 Minimal response to lasix  Remo Lipps C. Roxan Hockey, MD [facility] [phone]

## 2019-06-09 NOTE — Discharge Instructions (Signed)

## 2019-06-09 NOTE — Progress Notes (Signed)
1 Day Post-Op Procedure(s) (LRB): CORONARY ARTERY BYPASS GRAFTING (CABG), ON PUMP, TIMES THREE, USING LEFT INTERNAL MAMMARY ARTERY AND ENDOSCOPICALLY HARVESTED LEFT GREATER SAPHENOUS VEIN (N/A) TRANSESOPHAGEAL ECHOCARDIOGRAM (TEE) (N/A) Subjective: nsr on 20 mcg neo for BP  Objective: Vital signs in last 24 hours: Temp:  [97.3 F (36.3 C)-100.6 F (38.1 C)] 98.1 F (36.7 C) (07/03 0500) Pulse Rate:  [88-107] 100 (07/03 0710) Cardiac Rhythm: Normal sinus rhythm (07/02 2000) Resp:  [12-26] 18 (07/03 0710) BP: (81-161)/(54-85) 113/68 (07/03 0710) SpO2:  [87 %-100 %] 94 % (07/03 0710) Arterial Line BP: (87-169)/(47-75) 138/50 (07/03 0710) FiO2 (%):  [40 %-50 %] 40 % (07/02 1835) Weight:  [102.3 kg] 102.3 kg (07/03 0600)  Hemodynamic parameters for last 24 hours: PAP: (23-41)/(8-24) 33/21 CO:  [5.4 L/min-5.9 L/min] 5.9 L/min CI:  [2.5 L/min/m2-2.7 L/min/m2] 2.7 L/min/m2  Intake/Output from previous day: 07/02 0701 - 07/03 0700 In: 5900.5 [P.O.:360; I.V.:3957.2; Blood:565; IV Piggyback:1018.3] Out: 2144 [Urine:1214; Blood:550; Chest Tube:380] Intake/Output this shift: Total I/O In: -  Out: 90 [Urine:40; Chest Tube:50]       Exam    General- alert and comfortable    Neck- no JVD, no cervical adenopathy palpable, no carotid bruit   Lungs- clear without rales, wheezes   Cor- regular rate and rhythm, no murmur , gallop   Abdomen- soft, non-tender   Extremities - warm, non-tender, minimal edema   Neuro- oriented, appropriate, no focal weakness   Lab Results: Recent Labs    06/08/19 1959  06/08/19 2116 06/09/19 0443  WBC 12.3*  --   --  15.2*  HGB 9.5*   < > 7.1* 9.3*  HCT 28.5*   < > 21.0* 27.6*  PLT 95*  --   --  96*   < > = values in this interval not displayed.   BMET:  Recent Labs    06/08/19 2021 06/08/19 2116 06/09/19 0443  NA 135 141 135  K 4.0 3.6 4.2  CL 109  --  109  CO2 16*  --  19*  GLUCOSE 131*  --  155*  BUN 13  --  21  CREATININE 0.89  --   1.05  CALCIUM 7.4*  --  7.7*    PT/INR:  Recent Labs    06/08/19 1433  LABPROT 19.1*  INR 1.6*   ABG    Component Value Date/Time   PHART 7.339 (L) 06/08/2019 2116   HCO3 14.7 (L) 06/08/2019 2116   TCO2 15 (L) 06/08/2019 2116   ACIDBASEDEF 10.0 (H) 06/08/2019 2116   O2SAT 99.0 06/08/2019 2116   CBG (last 3)  Recent Labs    06/08/19 2353 06/09/19 0449 06/09/19 0814  GLUCAP 122* 150* 152*    Assessment/Plan: S/P Procedure(s) (LRB): CORONARY ARTERY BYPASS GRAFTING (CABG), ON PUMP, TIMES THREE, USING LEFT INTERNAL MAMMARY ARTERY AND ENDOSCOPICALLY HARVESTED LEFT GREATER SAPHENOUS VEIN (N/A) TRANSESOPHAGEAL ECHOCARDIOGRAM (TEE) (N/A) Wean neo Up to chair Low dose milrinone Lasix later when off neo  LOS: 5 days    [name] 06/09/2019

## 2019-06-10 ENCOUNTER — Inpatient Hospital Stay (HOSPITAL_COMMUNITY): Payer: PPO

## 2019-06-10 LAB — CBC
HCT: 26.2 % — ABNORMAL LOW (ref 39.0–52.0)
Hemoglobin: 8.6 g/dL — ABNORMAL LOW (ref 13.0–17.0)
MCH: 32.7 pg (ref 26.0–34.0)
MCHC: 32.8 g/dL (ref 30.0–36.0)
MCV: 99.6 fL (ref 80.0–100.0)
Platelets: 114 10*3/uL — ABNORMAL LOW (ref 150–400)
RBC: 2.63 MIL/uL — ABNORMAL LOW (ref 4.22–5.81)
RDW: 13.8 % (ref 11.5–15.5)
WBC: 13.6 10*3/uL — ABNORMAL HIGH (ref 4.0–10.5)
nRBC: 0 % (ref 0.0–0.2)

## 2019-06-10 LAB — BASIC METABOLIC PANEL
Anion gap: 8 (ref 5–15)
BUN: 27 mg/dL — ABNORMAL HIGH (ref 8–23)
CO2: 23 mmol/L (ref 22–32)
Calcium: 8.2 mg/dL — ABNORMAL LOW (ref 8.9–10.3)
Chloride: 103 mmol/L (ref 98–111)
Creatinine, Ser: 1.31 mg/dL — ABNORMAL HIGH (ref 0.61–1.24)
GFR calc Af Amer: >60 mL/min (ref >60)
GFR calc non Af Amer: 53 mL/min — ABNORMAL LOW (ref >60)
Glucose, Bld: 141 mg/dL — ABNORMAL HIGH (ref 70–99)
Potassium: 4.5 mmol/L (ref 3.5–5.1)
Sodium: 134 mmol/L — ABNORMAL LOW (ref 135–145)

## 2019-06-10 LAB — COOXEMETRY PANEL
Carboxyhemoglobin: 1.4 % (ref 0.5–1.5)
Methemoglobin: 0.7 % (ref 0.0–1.5)
O2 Saturation: 49.9 %
Total hemoglobin: 8 g/dL — ABNORMAL LOW (ref 12.0–16.0)

## 2019-06-10 LAB — GLUCOSE, CAPILLARY
Glucose-Capillary: 128 mg/dL — ABNORMAL HIGH (ref 70–99)
Glucose-Capillary: 130 mg/dL — ABNORMAL HIGH (ref 70–99)
Glucose-Capillary: 148 mg/dL — ABNORMAL HIGH (ref 70–99)
Glucose-Capillary: 105 mg/dL — ABNORMAL HIGH (ref 70–99)
Glucose-Capillary: 132 mg/dL — ABNORMAL HIGH (ref 70–99)

## 2019-06-10 MED ORDER — METOCLOPRAMIDE HCL 5 MG/ML IJ SOLN
10.0000 mg | Freq: Four times a day (QID) | INTRAMUSCULAR | Status: AC
  Administered 2019-06-10 – 2019-06-11 (×4): 10 mg via INTRAVENOUS
  Filled 2019-06-10 (×4): qty 2

## 2019-06-10 MED ORDER — PROMETHAZINE HCL 25 MG/ML IJ SOLN: 12.5000 mg | Freq: Four times a day (QID) | INTRAMUSCULAR | Status: DC | PRN

## 2019-06-10 MED ORDER — ATORVASTATIN CALCIUM 80 MG PO TABS
80.0000 mg | ORAL_TABLET | Freq: Every day | ORAL | Status: DC
  Administered 2019-06-10 – 2019-06-13 (×4): 80 mg via ORAL
  Filled 2019-06-10 (×4): qty 1

## 2019-06-10 MED ORDER — CHLORHEXIDINE GLUCONATE CLOTH 2 % EX PADS
6.0000 | MEDICATED_PAD | Freq: Every day | CUTANEOUS | Status: DC
  Administered 2019-06-12: 6 via TOPICAL

## 2019-06-10 MED ORDER — LACTULOSE 10 GM/15ML PO SOLN
20.0000 g | Freq: Once | ORAL | Status: AC
  Administered 2019-06-10: 10:00:00 20 g via ORAL
  Filled 2019-06-10: qty 30

## 2019-06-10 MED ORDER — INSULIN ASPART 100 UNIT/ML ~~LOC~~ SOLN
0.0000 [IU] | Freq: Three times a day (TID) | SUBCUTANEOUS | Status: DC
  Administered 2019-06-10: 2 [IU] via SUBCUTANEOUS

## 2019-06-10 MED ORDER — MECLIZINE HCL 25 MG PO TABS
25.0000 mg | ORAL_TABLET | Freq: Three times a day (TID) | ORAL | Status: DC | PRN
  Administered 2019-06-11: 25 mg via ORAL
  Filled 2019-06-10: qty 2

## 2019-06-10 MED ORDER — ALBUMIN HUMAN 5 % IV SOLN
12.5000 g | Freq: Once | INTRAVENOUS | Status: AC
  Administered 2019-06-10: 12.5 g via INTRAVENOUS
  Filled 2019-06-10: qty 250

## 2019-06-10 NOTE — Progress Notes (Signed)
      [address]       [place] [address]             [phone]      Still c/o feeling dizzy BP 127/70   Pulse 94   Temp 98.3 F (36.8 C) (Oral)   Resp 17   Ht 6\' 2"  (1.88 m)   Wt 103.1 kg   SpO2 92%   BMI 29.18 kg/m   Intake/Output Summary (Last 24 hours) at 06/10/2019 1832 Last data filed at 06/10/2019 1653 Gross per 24 hour  Intake 1279.46 ml  Output 1010 ml  Net 269.46 ml   Will give albumin to increase intravascular volume Meclizine PRN  Remo Lipps C. Roxan Hockey, MD [facility] [phone]

## 2019-06-10 NOTE — Progress Notes (Signed)
2 Days Post-Op Procedure(s) (LRB): CORONARY ARTERY BYPASS GRAFTING (CABG), ON PUMP, TIMES THREE, USING LEFT INTERNAL MAMMARY ARTERY AND ENDOSCOPICALLY HARVESTED LEFT GREATER SAPHENOUS VEIN (N/A) TRANSESOPHAGEAL ECHOCARDIOGRAM (TEE) (N/A) Subjective: C/o nausea, light headed when first gets up  Objective: Vital signs in last 24 hours: Temp:  [97.5 F (36.4 C)-98.4 F (36.9 C)] 98.4 F (36.9 C) (07/04 0838) Pulse Rate:  [66-122] 97 (07/04 0600) Cardiac Rhythm: Normal sinus rhythm (07/04 0400) Resp:  [14-34] 18 (07/04 0600) BP: (91-145)/(61-89) 120/79 (07/04 0600) SpO2:  [91 %-100 %] 96 % (07/04 0600) Arterial Line BP: (134)/(47) 134/47 (07/03 0930) Weight:  [103.1 kg] 103.1 kg (07/04 0500)  Hemodynamic parameters for last 24 hours: PAP: (27)/(12) 27/12  Intake/Output from previous day: 07/03 0701 - 07/04 0700 In: 1549 [P.O.:960; I.V.:389; IV Piggyback:200.1] Out: 1065 [Urine:755; Chest Tube:310] Intake/Output this shift: No intake/output data recorded.  General appearance: alert, cooperative and no distress Neurologic: intact Heart: regular rate and rhythm Lungs: diminished breath sounds bibasilar Abdomen: soft, nontender, hypoactive BS  Lab Results: Recent Labs    06/09/19 1611 06/10/19 0442  WBC 13.8* 13.6*  HGB 8.0* 8.6*  HCT 24.6* 26.2*  PLT 87* 114*   BMET:  Recent Labs    06/09/19 1611 06/10/19 0442  NA 135 134*  K 4.4 4.5  CL 105 103  CO2 22 23  GLUCOSE 181* 141*  BUN 22 27*  CREATININE 1.33* 1.31*  CALCIUM 8.0* 8.2*    PT/INR:  Recent Labs    06/08/19 1433  LABPROT 19.1*  INR 1.6*   ABG    Component Value Date/Time   PHART 7.339 (L) 06/08/2019 2116   HCO3 14.7 (L) 06/08/2019 2116   TCO2 15 (L) 06/08/2019 2116   ACIDBASEDEF 10.0 (H) 06/08/2019 2116   O2SAT 99.0 06/08/2019 2116   CBG (last 3)  Recent Labs    06/09/19 2339 06/10/19 0342 06/10/19 0654  GLUCAP 129* 128* 130*    Assessment/Plan: S/P Procedure(s) (LRB): CORONARY  ARTERY BYPASS GRAFTING (CABG), ON PUMP, TIMES THREE, USING LEFT INTERNAL MAMMARY ARTERY AND ENDOSCOPICALLY HARVESTED LEFT GREATER SAPHENOUS VEIN (N/A) TRANSESOPHAGEAL ECHOCARDIOGRAM (TEE) (N/A) -POD # 2 CV- in SR  On low dose milrinone- check co-ox RESP- IS for atelectasis RENAL- creatinine stable ENDO- CBG well controlled- change to AC and HS Anemia secondary to ABL, stable, follow GI- nausea is primary complaint, no BM in 5 days  Reglan, lactulose continue cardiac rehab   LOS: 6 days    [name] 06/10/2019

## 2019-06-10 NOTE — Progress Notes (Signed)
End of shift note: Patient has continued to complain of dizziness when sitting or walking. While trying to walk to the bathroom, he stated he felt faint and couldn't make it. Patient has been very drowsy since waking up from his nap around 1430. Keeps falling asleep. The only thing sedating that he has had was 1 oxycodone around 1130. CVP at 1835 was 16. VS stable, 98.0, 94, 122/76 (90), 14, 02 sat 92%. No BM yet even though Lactulose was added. Sat on BSC to try but kept falling asleep and finally decided he didn't have to. He then asked if he cold lay back down. Patient assisted back to bed with a lot of help. Will continue to monitor closely. MD aware.

## 2019-06-10 NOTE — Progress Notes (Signed)
Staff was unable to ambulate the patient this morning. Patient became dizzy and nauseated after sitting on the side of the bed for about 5 minutes. Patient was given IV Zofran. Staff assisted patient with standing so he could be weighted and then to the chair.

## 2019-06-11 ENCOUNTER — Encounter (HOSPITAL_COMMUNITY): Payer: Self-pay | Admitting: Cardiothoracic Surgery

## 2019-06-11 ENCOUNTER — Inpatient Hospital Stay (HOSPITAL_COMMUNITY): Payer: PPO

## 2019-06-11 LAB — CBC
HCT: 22.9 % — ABNORMAL LOW (ref 39.0–52.0)
Hemoglobin: 7.5 g/dL — ABNORMAL LOW (ref 13.0–17.0)
MCH: 32.9 pg (ref 26.0–34.0)
MCHC: 32.8 g/dL (ref 30.0–36.0)
MCV: 100.4 fL — ABNORMAL HIGH (ref 80.0–100.0)
Platelets: 132 10*3/uL — ABNORMAL LOW (ref 150–400)
RBC: 2.28 MIL/uL — ABNORMAL LOW (ref 4.22–5.81)
RDW: 13.7 % (ref 11.5–15.5)
WBC: 10.8 10*3/uL — ABNORMAL HIGH (ref 4.0–10.5)
nRBC: 0.9 % — ABNORMAL HIGH (ref 0.0–0.2)

## 2019-06-11 LAB — COMPREHENSIVE METABOLIC PANEL
ALT: 63 U/L — ABNORMAL HIGH (ref 0–44)
AST: 93 U/L — ABNORMAL HIGH (ref 15–41)
Albumin: 2.8 g/dL — ABNORMAL LOW (ref 3.5–5.0)
Alkaline Phosphatase: 135 U/L — ABNORMAL HIGH (ref 38–126)
Anion gap: 9 (ref 5–15)
BUN: 37 mg/dL — ABNORMAL HIGH (ref 8–23)
CO2: 22 mmol/L (ref 22–32)
Calcium: 8.5 mg/dL — ABNORMAL LOW (ref 8.9–10.3)
Chloride: 102 mmol/L (ref 98–111)
Creatinine, Ser: 1.42 mg/dL — ABNORMAL HIGH (ref 0.61–1.24)
GFR calc Af Amer: 56 mL/min — ABNORMAL LOW (ref >60)
GFR calc non Af Amer: 48 mL/min — ABNORMAL LOW (ref >60)
Glucose, Bld: 136 mg/dL — ABNORMAL HIGH (ref 70–99)
Potassium: 4.3 mmol/L (ref 3.5–5.1)
Sodium: 133 mmol/L — ABNORMAL LOW (ref 135–145)
Total Bilirubin: 1.2 mg/dL (ref 0.3–1.2)
Total Protein: 5.3 g/dL — ABNORMAL LOW (ref 6.5–8.1)

## 2019-06-11 LAB — TYPE AND SCREEN
ABO/RH(D): A POS
Antibody Screen: NEGATIVE
Unit division: 0
Unit division: 0

## 2019-06-11 LAB — BPAM RBC
Blood Product Expiration Date: 202007182359
Blood Product Expiration Date: 202007182359
ISSUE DATE / TIME: 202007020808
ISSUE DATE / TIME: 202007020808
Unit Type and Rh: 6200
Unit Type and Rh: 6200

## 2019-06-11 LAB — GLUCOSE, CAPILLARY
Glucose-Capillary: 93 mg/dL (ref 70–99)
Glucose-Capillary: 118 mg/dL — ABNORMAL HIGH (ref 70–99)
Glucose-Capillary: 117 mg/dL — ABNORMAL HIGH (ref 70–99)
Glucose-Capillary: 96 mg/dL (ref 70–99)

## 2019-06-11 LAB — PREPARE RBC (CROSSMATCH)

## 2019-06-11 MED ORDER — LACTULOSE 10 GM/15ML PO SOLN
20.0000 g | Freq: Every day | ORAL | Status: DC | PRN
  Administered 2019-06-11: 18:00:00 20 g via ORAL
  Filled 2019-06-11: qty 30

## 2019-06-11 MED ORDER — SODIUM CHLORIDE 0.9% IV SOLUTION
Freq: Once | INTRAVENOUS | Status: AC
  Administered 2019-06-11: 13:00:00 via INTRAVENOUS

## 2019-06-11 MED ORDER — FUROSEMIDE 10 MG/ML IJ SOLN
40.0000 mg | Freq: Once | INTRAMUSCULAR | Status: AC
  Administered 2019-06-11: 40 mg via INTRAVENOUS
  Filled 2019-06-11: qty 4

## 2019-06-11 MED ORDER — FLEET ENEMA 7-19 GM/118ML RE ENEM
1.0000 | ENEMA | Freq: Every day | RECTAL | Status: DC | PRN
  Administered 2019-06-11: 1 via RECTAL
  Filled 2019-06-11 (×3): qty 1

## 2019-06-11 MED ORDER — FUROSEMIDE 10 MG/ML IJ SOLN
20.0000 mg | Freq: Once | INTRAMUSCULAR | Status: AC
  Administered 2019-06-11: 20 mg via INTRAVENOUS
  Filled 2019-06-11: qty 2

## 2019-06-11 NOTE — Progress Notes (Signed)
      [address]       [place] [address]             [phone]      Ambulated 2 laps around unit  BP 109/71   Pulse 95   Temp 98.3 F (36.8 C) (Oral)   Resp (!) 21   Ht 6\' 2"  (1.88 m)   Wt 102.5 kg   SpO2 93%   BMI 29.01 kg/m  BP 140/86 after transfusion   Intake/Output Summary (Last 24 hours) at 06/11/2019 1836 Last data filed at 06/11/2019 1700 Gross per 24 hour  Intake 2288.34 ml  Output 1200 ml  Net 1088.34 ml   Feels like he needs to have a BM  Makynlee Kressin C. Roxan Hockey, MD [facility] [phone]

## 2019-06-11 NOTE — Progress Notes (Signed)
3 Days Post-Op Procedure(s) (LRB): CORONARY ARTERY BYPASS GRAFTING (CABG), ON PUMP, TIMES THREE, USING LEFT INTERNAL MAMMARY ARTERY AND ENDOSCOPICALLY HARVESTED LEFT GREATER SAPHENOUS VEIN (N/A) TRANSESOPHAGEAL ECHOCARDIOGRAM (TEE) (N/A) Subjective: Just back form walking around unit Still some dizziness No BM yet  Objective: Vital signs in last 24 hours: Temp:  [97.5 F (36.4 C)-98.8 F (37.1 C)] 97.5 F (36.4 C) (07/05 0736) Pulse Rate:  [25-99] 96 (07/05 0700) Cardiac Rhythm: Normal sinus rhythm;Bundle branch block (07/05 0400) Resp:  [11-28] 18 (07/05 0700) BP: (89-149)/(61-110) 137/76 (07/05 0700) SpO2:  [91 %-98 %] 98 % (07/05 0700) Weight:  [102.5 kg] 102.5 kg (07/05 0600)  Hemodynamic parameters for last 24 hours:    Intake/Output from previous day: 07/04 0701 - 07/05 0700 In: 1693.4 [P.O.:1320; I.V.:128.1; IV Piggyback:245.3] Out: 810 [Urine:800; Chest Tube:10] Intake/Output this shift: No intake/output data recorded.  General appearance: alert, cooperative and no distress Neurologic: intact Heart: regular rate and rhythm Lungs: diminished breath sounds bibasilar Abdomen: normal findings: soft, non-tender Wound: clean and dry  Lab Results: Recent Labs    06/10/19 0442 06/11/19 0341  WBC 13.6* 10.8*  HGB 8.6* 7.5*  HCT 26.2* 22.9*  PLT 114* 132*   BMET:  Recent Labs    06/10/19 0442 06/11/19 0341  NA 134* 133*  K 4.5 4.3  CL 103 102  CO2 23 22  GLUCOSE 141* 136*  BUN 27* 37*  CREATININE 1.31* 1.42*  CALCIUM 8.2* 8.5*    PT/INR:  Recent Labs    06/08/19 1433  LABPROT 19.1*  INR 1.6*   ABG    Component Value Date/Time   PHART 7.339 (L) 06/08/2019 2116   HCO3 14.7 (L) 06/08/2019 2116   TCO2 15 (L) 06/08/2019 2116   ACIDBASEDEF 10.0 (H) 06/08/2019 2116   O2SAT 49.9 06/10/2019 1618   CBG (last 3)  Recent Labs    06/10/19 1550 06/10/19 2115 06/11/19 0655  GLUCAP 105* 132* 93    Assessment/Plan: S/P Procedure(s) (LRB): CORONARY  ARTERY BYPASS GRAFTING (CABG), ON PUMP, TIMES THREE, USING LEFT INTERNAL MAMMARY ARTERY AND ENDOSCOPICALLY HARVESTED LEFT GREATER SAPHENOUS VEIN (N/A) TRANSESOPHAGEAL ECHOCARDIOGRAM (TEE) (N/A) POD # 3 CABG CV- in SR, BP a little low RESP- bibasilar atelectasis- continue IS RENAL- creatinine up slightly to 1.42, lytes OK Anemia- Hct 22- given dizziness, malaise will transfuse 1 unit for symptomatic anemia- d/w patient SCD + ambulation for DVT prophylaxis ENDO_ CBG well controlled   LOS: 7 days    [name] 06/11/2019

## 2019-06-12 ENCOUNTER — Inpatient Hospital Stay (HOSPITAL_COMMUNITY): Payer: PPO

## 2019-06-12 DIAGNOSIS — Z951 Presence of aortocoronary bypass graft: Secondary | ICD-10-CM

## 2019-06-12 LAB — BPAM RBC
Blood Product Expiration Date: 202007182359
ISSUE DATE / TIME: 202007051241
Unit Type and Rh: 6200

## 2019-06-12 LAB — BASIC METABOLIC PANEL
Anion gap: 8 (ref 5–15)
BUN: 29 mg/dL — ABNORMAL HIGH (ref 8–23)
CO2: 25 mmol/L (ref 22–32)
Calcium: 8.3 mg/dL — ABNORMAL LOW (ref 8.9–10.3)
Chloride: 102 mmol/L (ref 98–111)
Creatinine, Ser: 1.1 mg/dL (ref 0.61–1.24)
GFR calc Af Amer: >60 mL/min (ref >60)
GFR calc non Af Amer: >60 mL/min (ref >60)
Glucose, Bld: 110 mg/dL — ABNORMAL HIGH (ref 70–99)
Potassium: 4 mmol/L (ref 3.5–5.1)
Sodium: 135 mmol/L (ref 135–145)

## 2019-06-12 LAB — COOXEMETRY PANEL
Carboxyhemoglobin: 2.2 % — ABNORMAL HIGH (ref 0.5–1.5)
Methemoglobin: 1.1 % (ref 0.0–1.5)
O2 Saturation: 67.7 %
Total hemoglobin: 8.7 g/dL — ABNORMAL LOW (ref 12.0–16.0)

## 2019-06-12 LAB — TYPE AND SCREEN
ABO/RH(D): A POS
Antibody Screen: NEGATIVE
Unit division: 0

## 2019-06-12 LAB — CBC
HCT: 26.5 % — ABNORMAL LOW (ref 39.0–52.0)
Hemoglobin: 8.6 g/dL — ABNORMAL LOW (ref 13.0–17.0)
MCH: 32.1 pg (ref 26.0–34.0)
MCHC: 32.5 g/dL (ref 30.0–36.0)
MCV: 98.9 fL (ref 80.0–100.0)
Platelets: 155 10*3/uL (ref 150–400)
RBC: 2.68 MIL/uL — ABNORMAL LOW (ref 4.22–5.81)
RDW: 14.9 % (ref 11.5–15.5)
WBC: 11.4 10*3/uL — ABNORMAL HIGH (ref 4.0–10.5)
nRBC: 1.7 % — ABNORMAL HIGH (ref 0.0–0.2)

## 2019-06-12 LAB — GLUCOSE, CAPILLARY
Glucose-Capillary: 87 mg/dL (ref 70–99)
Glucose-Capillary: 99 mg/dL (ref 70–99)

## 2019-06-12 MED ORDER — MILRINONE LACTATE IN DEXTROSE 20-5 MG/100ML-% IV SOLN: 0.1250 ug/kg/min | INTRAVENOUS | Status: DC

## 2019-06-12 MED ORDER — FUROSEMIDE 40 MG PO TABS
40.0000 mg | ORAL_TABLET | Freq: Every day | ORAL | Status: DC
  Administered 2019-06-12 – 2019-06-14 (×3): 40 mg via ORAL
  Filled 2019-06-12 (×3): qty 1

## 2019-06-12 MED ORDER — LISINOPRIL 2.5 MG PO TABS
2.5000 mg | ORAL_TABLET | Freq: Every day | ORAL | Status: DC
  Administered 2019-06-12 – 2019-06-13 (×2): 2.5 mg via ORAL
  Filled 2019-06-12 (×2): qty 1

## 2019-06-12 MED ORDER — METOPROLOL TARTRATE 25 MG/10 ML ORAL SUSPENSION
25.0000 mg | Freq: Two times a day (BID) | ORAL | Status: DC
  Filled 2019-06-12 (×5): qty 10

## 2019-06-12 MED ORDER — METOPROLOL TARTRATE 25 MG PO TABS
25.0000 mg | ORAL_TABLET | Freq: Two times a day (BID) | ORAL | Status: DC
  Administered 2019-06-12 – 2019-06-14 (×5): 25 mg via ORAL
  Filled 2019-06-12 (×5): qty 1

## 2019-06-12 NOTE — Progress Notes (Signed)
Spoke with RN Morey Hummingbird and RN stated she is aware of the DC central line order

## 2019-06-12 NOTE — Progress Notes (Addendum)
      [name]       [place] [address]             [phone]      4 Days Post-Op Procedure(s) (LRB): CORONARY ARTERY BYPASS GRAFTING (CABG), ON PUMP, TIMES THREE, USING LEFT INTERNAL MAMMARY ARTERY AND ENDOSCOPICALLY HARVESTED LEFT GREATER SAPHENOUS VEIN (N/A) TRANSESOPHAGEAL ECHOCARDIOGRAM (TEE) (N/A)   Subjective:  Patient up in chair.  Feeling better after blood transfusion yesterday.  + ambulation  + BM  Objective: Vital signs in last 24 hours: Temp:  [97.8 F (36.6 C)-99.2 F (37.3 C)] 97.9 F (36.6 C) (07/06 0400) Pulse Rate:  [85-115] 100 (07/06 0700) Cardiac Rhythm: Normal sinus rhythm (07/06 0400) Resp:  [14-29] 17 (07/06 0700) BP: (103-143)/(61-100) 116/69 (07/06 0700) SpO2:  [85 %-100 %] 94 % (07/06 0700) Weight:  [101.9 kg] 101.9 kg (07/06 0620)  Intake/Output from previous day: 07/05 0701 - 07/06 0700 In: 2384.4 [P.O.:1860; I.V.:209.4; Blood:315] Out: 2400 [Urine:2400]  General appearance: alert, cooperative and no distress Heart: regular rate and rhythm Lungs: clear to auscultation bilaterally Abdomen: soft, non-tender; bowel sounds normal; no masses,  no organomegaly Extremities: edema trace Wound: clean and dry  Lab Results: Recent Labs    06/11/19 0341 06/12/19 0529  WBC 10.8* 11.4*  HGB 7.5* 8.6*  HCT 22.9* 26.5*  PLT 132* 155   BMET:  Recent Labs    06/11/19 0341 06/12/19 0529  NA 133* 135  K 4.3 4.0  CL 102 102  CO2 22 25  GLUCOSE 136* 110*  BUN 37* 29*  CREATININE 1.42* 1.10  CALCIUM 8.5* 8.3*    PT/INR: No results for input(s): LABPROT, INR in the last 72 hours. ABG    Component Value Date/Time   PHART 7.339 (L) 06/08/2019 2116   HCO3 14.7 (L) 06/08/2019 2116   TCO2 15 (L) 06/08/2019 2116   ACIDBASEDEF 10.0 (H) 06/08/2019 2116   O2SAT 67.7 06/12/2019 0535   CBG (last 3)  Recent Labs    06/11/19 1528 06/11/19 2111 06/12/19 0648  GLUCAP 117* 96 87    Assessment/Plan: S/P Procedure(s)  (LRB): CORONARY ARTERY BYPASS GRAFTING (CABG), ON PUMP, TIMES THREE, USING LEFT INTERNAL MAMMARY ARTERY AND ENDOSCOPICALLY HARVESTED LEFT GREATER SAPHENOUS VEIN (N/A) TRANSESOPHAGEAL ECHOCARDIOGRAM (TEE) (N/A)  1. CV- Sinus Tachycardia- will increase Lopressor to 25 mg BID,  2. Pulm- no acute issues, continue IS 3. Renal- creatinine WNL, weight is elevated, however edema on exam doesn't match weight numbers, will transition to oral regimen of lasix 4. GI- constipation resolved 5. Expected post operative blood loss anemia, Hgb improved to 8.6 after transfusion 6.Ruptured Bakers Cyst on Right- continue Kpad 7. CBGs controlled- will d/c SSIP and cbgs as patient is not a diabetic 8. Dispo- patient stable, increase Lopressor for better HR control, , transfer to 4E, possible d/c in next 24-48 hours   LOS: 8 days    [name] patient examined and medical record reviewed,agree with above note. Add low dose lisinopril for preop MI [name] 06/12/2019

## 2019-06-12 NOTE — Progress Notes (Signed)
Progress Note  Patient Name: [name] Date of Encounter: 06/12/2019  Primary Cardiologist: Mertie Moores, MD   Subjective   Feeling better, improved.  No unusual chest pain, shortness of breath.  Going to bathroom, trying have a bowel movement.  Inpatient Medications    Scheduled Meds:  acetaminophen  1,000 mg Oral Q6H   Or   acetaminophen (TYLENOL) oral liquid 160 mg/5 mL  1,000 mg Per Tube Q6H   aspirin EC  325 mg Oral Daily   Or   aspirin  324 mg Per Tube Daily   atorvastatin  80 mg Oral q1800   bisacodyl  10 mg Oral Daily   Or   bisacodyl  10 mg Rectal Daily   Chlorhexidine Gluconate Cloth  6 each Topical Daily   Chlorhexidine Gluconate Cloth  6 each Topical Daily   docusate sodium  200 mg Oral Daily   famotidine  20 mg Oral Daily   furosemide  40 mg Oral Daily   metoprolol tartrate  25 mg Oral BID   Or   metoprolol tartrate  25 mg Per Tube BID   pantoprazole  40 mg Oral Daily   sodium chloride flush  10-40 mL Intracatheter Q12H   sodium chloride flush  3 mL Intravenous Q12H   Continuous Infusions:  sodium chloride 10 mL/hr at 06/08/19 1630   PRN Meds: lactulose, lip balm, meclizine, metoprolol tartrate, ondansetron (ZOFRAN) IV, oxyCODONE, promethazine, sodium chloride flush, sodium chloride flush, sodium phosphate, traMADol   Vital Signs    Vitals:   06/12/19 0600 06/12/19 0620 06/12/19 0700 06/12/19 0815  BP:  (!) 131/100 116/69   Pulse: (!) 110 (!) 107 100   Resp: 19 20 17    Temp:    98.8 F (37.1 C)  TempSrc:    Oral  SpO2: 94%  94%   Weight:  101.9 kg    Height:        Intake/Output Summary (Last 24 hours) at 06/12/2019 0853 Last data filed at 06/12/2019 0700 Gross per 24 hour  Intake 1650.41 ml  Output 2400 ml  Net -749.59 ml   Last 3 Weights 06/12/2019 06/11/2019 06/10/2019  Weight (lbs) 224 lb 10.4 oz 225 lb 15.5 oz 227 lb 4.7 oz  Weight (kg) 101.9 kg 102.5 kg 103.1 kg      Telemetry    89-105 sinus tachycardia-  Personally Reviewed  ECG    No new- Personally Reviewed  Physical Exam   GEN: No acute distress.   Neck: No JVD Cardiac: RRR, no murmurs, rubs, or gallops.  Post CABG scar Respiratory: Clear to auscultation bilaterally. GI: Soft, nontender, non-distended  MS: No edema; No deformity. Neuro:  Nonfocal  Psych: Normal affect   Labs    High Sensitivity Troponin:   Recent Labs  Lab 06/04/19 1445 06/04/19 1658 06/04/19 1850 06/04/19 2030 06/04/19 2323  TROPONINIHS 8 166* 358* 455* 1,003*      Cardiac EnzymesNo results for input(s): TROPONINI in the last 168 hours. No results for input(s): TROPIPOC in the last 168 hours.   Chemistry Recent Labs  Lab 06/10/19 0442 06/11/19 0341 06/12/19 0529  NA 134* 133* 135  K 4.5 4.3 4.0  CL 103 102 102  CO2 23 22 25   GLUCOSE 141* 136* 110*  BUN 27* 37* 29*  CREATININE 1.31* 1.42* 1.10  CALCIUM 8.2* 8.5* 8.3*  PROT  --  5.3*  --   ALBUMIN  --  2.8*  --   AST  --  93*  --  ALT  --  63*  --   ALKPHOS  --  135*  --   BILITOT  --  1.2  --   GFRNONAA 53* 48* >60  GFRAA >60 56* >60  ANIONGAP 8 9 8      Hematology Recent Labs  Lab 06/10/19 0442 06/11/19 0341 06/12/19 0529  WBC 13.6* 10.8* 11.4*  RBC 2.63* 2.28* 2.68*  HGB 8.6* 7.5* 8.6*  HCT 26.2* 22.9* 26.5*  MCV 99.6 100.4* 98.9  MCH 32.7 32.9 32.1  MCHC 32.8 32.8 32.5  RDW 13.8 13.7 14.9  PLT 114* 132* 155    BNPNo results for input(s): BNP, PROBNP in the last 168 hours.   DDimer No results for input(s): DDIMER in the last 168 hours.   Radiology    Dg Chest Port 1 View  Result Date: 06/12/2019 CLINICAL DATA:  CABG. EXAM: PORTABLE CHEST 1 VIEW COMPARISON:  06/11/2019. FINDINGS: Right IJ line in stable position. Prior CABG. Persistent bibasilar atelectasis. Small left pleural effusion again noted. No pneumothorax. Chest is unchanged from prior exam. IMPRESSION: 1.  Right IJ line stable position. 2.  Prior CABG.  Stable cardiomegaly. 3. Stable bibasilar atelectasis.  And small left pleural effusion. No change from prior exam. Electronically Signed   By: Marcello Moores  Register   On: 06/12/2019 06:11   Dg Chest Port 1 View  Result Date: 06/11/2019 CLINICAL DATA:  Atelectasis. EXAM: PORTABLE CHEST 1 VIEW COMPARISON:  Chest x-ray from yesterday. FINDINGS: Unchanged right internal jugular sheath. Interval removal of the left chest tube and mediastinal drain. Stable cardiomediastinal silhouette status post CABG. Normal pulmonary vascularity. Unchanged left greater than right basilar atelectasis and probable small left pleural effusion. No pneumothorax. No acute osseous abnormality. IMPRESSION: 1. Interval removal of the left chest tube and mediastinal drain. No pneumothorax. 2. Unchanged bibasilar atelectasis and probable small left pleural effusion. Electronically Signed   By: Titus Dubin M.D.   On: 06/11/2019 08:21    Cardiac Studies   Severe triple-vessel coronary artery disease, echocardiogram EF 50 to 55%  Patient Profile     73 y.o. male with non-ST elevation myocardial infarction, EF 50 to 55% with severe triple-vessel coronary artery disease who underwent CABG  Assessment & Plan    Coronary artery disease status post CABG - Currently progressing well.  Secondary prevention.  Aspirin, statin, beta-blocker  Out of hospital cardiac arrest - Non-STEMI, CABG for revascularization, EF 50 to 55%.  Sinus tachycardia with right bundle branch block - No evidence of atrial fibrillation or atrial flutter.  Agree with increasing metoprolol.  Watch blood pressure.  Non-ST elevation myocardial infarction - If able, consider clopidogrel 75 mg once a day for 1 year if bleeding risk felt to be low enough.  Hyperlipidemia -LDL 61-atorvastatin 80 mg, high intensity.     For questions or updates, please contact [place] Please consult [email or web address] for contact info under        Signed, Candee Furbish, MD  06/12/2019, 8:53 AM

## 2019-06-13 ENCOUNTER — Inpatient Hospital Stay (HOSPITAL_COMMUNITY): Payer: PPO

## 2019-06-13 LAB — CBC
HCT: 25.9 % — ABNORMAL LOW (ref 39.0–52.0)
Hemoglobin: 8.5 g/dL — ABNORMAL LOW (ref 13.0–17.0)
MCH: 32.1 pg (ref 26.0–34.0)
MCHC: 32.8 g/dL (ref 30.0–36.0)
MCV: 97.7 fL (ref 80.0–100.0)
Platelets: 174 10*3/uL (ref 150–400)
RBC: 2.65 MIL/uL — ABNORMAL LOW (ref 4.22–5.81)
RDW: 14.5 % (ref 11.5–15.5)
WBC: 10 10*3/uL (ref 4.0–10.5)
nRBC: 0.7 % — ABNORMAL HIGH (ref 0.0–0.2)

## 2019-06-13 LAB — COMPREHENSIVE METABOLIC PANEL
ALT: 77 U/L — ABNORMAL HIGH (ref 0–44)
AST: 66 U/L — ABNORMAL HIGH (ref 15–41)
Albumin: 2.4 g/dL — ABNORMAL LOW (ref 3.5–5.0)
Alkaline Phosphatase: 228 U/L — ABNORMAL HIGH (ref 38–126)
Anion gap: 9 (ref 5–15)
BUN: 26 mg/dL — ABNORMAL HIGH (ref 8–23)
CO2: 27 mmol/L (ref 22–32)
Calcium: 8.4 mg/dL — ABNORMAL LOW (ref 8.9–10.3)
Chloride: 100 mmol/L (ref 98–111)
Creatinine, Ser: 1.17 mg/dL (ref 0.61–1.24)
GFR calc Af Amer: >60 mL/min (ref >60)
GFR calc non Af Amer: >60 mL/min (ref >60)
Glucose, Bld: 102 mg/dL — ABNORMAL HIGH (ref 70–99)
Potassium: 4.5 mmol/L (ref 3.5–5.1)
Sodium: 136 mmol/L (ref 135–145)
Total Bilirubin: 1.5 mg/dL — ABNORMAL HIGH (ref 0.3–1.2)
Total Protein: 5.1 g/dL — ABNORMAL LOW (ref 6.5–8.1)

## 2019-06-13 LAB — BLOOD GAS, ARTERIAL
Acid-base deficit: 1.5 mmol/L (ref 0.0–2.0)
Bicarbonate: 19.5 mmol/L — ABNORMAL LOW (ref 20.0–28.0)
Drawn by: 44135
FIO2: 21
O2 Saturation: 99.4 %
Patient temperature: 98.6
pH, Arterial: 7.662 (ref 7.350–7.450)
pO2, Arterial: 124 mmHg — ABNORMAL HIGH (ref 83.0–108.0)

## 2019-06-13 MED ORDER — LOSARTAN POTASSIUM 25 MG PO TABS
25.0000 mg | ORAL_TABLET | Freq: Every day | ORAL | Status: DC
  Administered 2019-06-14: 08:00:00 25 mg via ORAL
  Filled 2019-06-13: qty 1

## 2019-06-13 MED ORDER — SORBITOL 70 % PO SOLN: 60.0000 mL | Freq: Once | ORAL | Status: DC

## 2019-06-13 MED ORDER — METOLAZONE 5 MG PO TABS
5.0000 mg | ORAL_TABLET | Freq: Every day | ORAL | Status: AC
  Administered 2019-06-13 – 2019-06-14 (×2): 5 mg via ORAL
  Filled 2019-06-13 (×2): qty 1

## 2019-06-13 MED ORDER — METOLAZONE 5 MG PO TABS: 5.0000 mg | ORAL_TABLET | Freq: Once | ORAL | Status: DC

## 2019-06-13 MED ORDER — SORBITOL 70 % SOLN
60.0000 mL | Freq: Once | Status: AC
  Administered 2019-06-13: 60 mL via ORAL
  Filled 2019-06-13: qty 60

## 2019-06-13 MED FILL — Heparin Sodium (Porcine) Inj 1000 Unit/ML: INTRAMUSCULAR | Qty: 30 | Status: AC

## 2019-06-13 MED FILL — Magnesium Sulfate Inj 50%: INTRAMUSCULAR | Qty: 10 | Status: AC

## 2019-06-13 MED FILL — Electrolyte-R (PH 7.4) Solution: INTRAVENOUS | Qty: 3000 | Status: AC

## 2019-06-13 MED FILL — Sodium Chloride IV Soln 0.9%: INTRAVENOUS | Qty: 4000 | Status: AC

## 2019-06-13 MED FILL — Sodium Bicarbonate IV Soln 8.4%: INTRAVENOUS | Qty: 50 | Status: AC

## 2019-06-13 MED FILL — Lidocaine HCl Local Soln Prefilled Syringe 100 MG/5ML (2%): INTRAMUSCULAR | Qty: 5 | Status: AC

## 2019-06-13 MED FILL — Potassium Chloride Inj 2 mEq/ML: INTRAVENOUS | Qty: 40 | Status: AC

## 2019-06-13 MED FILL — Mannitol IV Soln 20%: INTRAVENOUS | Qty: 1000 | Status: AC

## 2019-06-13 MED FILL — Heparin Sodium (Porcine) Inj 1000 Unit/ML: INTRAMUSCULAR | Qty: 20 | Status: AC

## 2019-06-13 NOTE — Progress Notes (Addendum)
      [address]       [place] [address]             [phone]      5 Days Post-Op Procedure(s) (LRB): CORONARY ARTERY BYPASS GRAFTING (CABG), ON PUMP, TIMES THREE, USING LEFT INTERNAL MAMMARY ARTERY AND ENDOSCOPICALLY HARVESTED LEFT GREATER SAPHENOUS VEIN (N/A) TRANSESOPHAGEAL ECHOCARDIOGRAM (TEE) (N/A)   Subjective:  No new complaints.  + ambulation  + BM  Objective: Vital signs in last 24 hours: Temp:  [97.6 F (36.4 C)-98.8 F (37.1 C)] 98.1 F (36.7 C) (07/07 0344) Pulse Rate:  [85-112] 97 (07/07 0344) Cardiac Rhythm: Normal sinus rhythm (07/07 0344) Resp:  [15-24] 20 (07/07 0344) BP: (77-150)/(62-89) 118/72 (07/07 0344) SpO2:  [94 %-100 %] 99 % (07/07 0344) Weight:  [101.8 kg] 101.8 kg (07/07 0400)  Intake/Output from previous day: 07/06 0701 - 07/07 0700 In: 743.5 [P.O.:740; I.V.:3.5] Out: 520 [Urine:520]  General appearance: alert, cooperative and no distress Heart: regular rate and rhythm Lungs: clear to auscultation bilaterally Abdomen: soft, non-tender; bowel sounds normal; no masses,  no organomegaly Extremities: edema 2+ pitting Wound: clean and dry  Lab Results: Recent Labs    06/12/19 0529 06/13/19 0259  WBC 11.4* 10.0  HGB 8.6* 8.5*  HCT 26.5* 25.9*  PLT 155 174   BMET:  Recent Labs    06/12/19 0529 06/13/19 0259  NA 135 136  K 4.0 4.5  CL 102 100  CO2 25 27  GLUCOSE 110* 102*  BUN 29* 26*  CREATININE 1.10 1.17  CALCIUM 8.3* 8.4*    PT/INR: No results for input(s): LABPROT, INR in the last 72 hours. ABG    Component Value Date/Time   PHART 7.339 (L) 06/08/2019 2116   HCO3 14.7 (L) 06/08/2019 2116   TCO2 15 (L) 06/08/2019 2116   ACIDBASEDEF 10.0 (H) 06/08/2019 2116   O2SAT 67.7 06/12/2019 0535   CBG (last 3)  Recent Labs    06/11/19 2111 06/12/19 0648 06/12/19 1118  GLUCAP 96 87 99    Assessment/Plan: S/P Procedure(s) (LRB): CORONARY ARTERY BYPASS GRAFTING (CABG), ON PUMP, TIMES THREE, USING  LEFT INTERNAL MAMMARY ARTERY AND ENDOSCOPICALLY HARVESTED LEFT GREATER SAPHENOUS VEIN (N/A) TRANSESOPHAGEAL ECHOCARDIOGRAM (TEE) (N/A)  1. CV- NSR, BP controlled- continue Lopressor, Lisinopril for now, will start Plavix at discharge per Cardiology recommendations  2. Pulm- no acute issues, continue IS 3. Renal- creatinine relatively stable, weight is up about 10 lbs as patient states baseline is 214-218 at home, will continue Lasix, Metolazone added per PVT 4. Expected post operative blood loss anemia, stable 5. Dispo- patient stable, maintaining NSR, hopefully he will diurese well today with addition of Metolazone, possibly for d/c in AM   LOS: 9 days    Ellwood Handler 06/13/2019  cxr clear, incisions clean, nsr Ruptured Bakers cyst in R calf edematous but improving DC instructions reviewed with patient Cont po lasix at home 1 week Resume preop losartan 25 mg  dose for preop nonstemi  patient examined and medical record reviewed,agree with above note. [name] 06/13/2019

## 2019-06-13 NOTE — Progress Notes (Signed)
CARDIAC REHAB PHASE I   PRE:  Rate/Rhythm: 88 SR  BP:  Sitting: 126/75      SaO2: 100 RA  MODE:  Ambulation: 370 ft   POST:  Rate/Rhythm: 108 ST  BP:  Sitting: 131/73    SaO2: 94 RA   Pt ambulated 366ft in hallway standby assist with front wheel walkers. Pt c/o some SOB, and slight dizziness. Pt returned to recliner. Call bell and phone within reach. Encouraged to continue IS use and go for a third walk later in the day. Will continue to follow.  [id number] Rufina Falco, RN BSN 06/13/2019 10:19 AM

## 2019-06-14 DIAGNOSIS — I10 Essential (primary) hypertension: Secondary | ICD-10-CM | POA: Diagnosis not present

## 2019-06-14 LAB — BASIC METABOLIC PANEL
Anion gap: 10 (ref 5–15)
BUN: 19 mg/dL (ref 8–23)
CO2: 28 mmol/L (ref 22–32)
Calcium: 8.8 mg/dL — ABNORMAL LOW (ref 8.9–10.3)
Chloride: 97 mmol/L — ABNORMAL LOW (ref 98–111)
Creatinine, Ser: 1.19 mg/dL (ref 0.61–1.24)
GFR calc Af Amer: >60 mL/min (ref >60)
GFR calc non Af Amer: 60 mL/min — ABNORMAL LOW (ref >60)
Glucose, Bld: 117 mg/dL — ABNORMAL HIGH (ref 70–99)
Potassium: 3.3 mmol/L — ABNORMAL LOW (ref 3.5–5.1)
Sodium: 135 mmol/L (ref 135–145)

## 2019-06-14 MED ORDER — FUROSEMIDE 40 MG PO TABS: 40.0000 mg | ORAL_TABLET | Freq: Every day | ORAL | 0 refills | Status: DC

## 2019-06-14 MED ORDER — OXYCODONE HCL 5 MG PO TABS: 5.0000 mg | ORAL_TABLET | ORAL | 0 refills | Status: DC | PRN

## 2019-06-14 MED ORDER — POTASSIUM CHLORIDE CRYS ER 20 MEQ PO TBCR: 40.0000 meq | EXTENDED_RELEASE_TABLET | Freq: Every day | ORAL | 0 refills | Status: DC

## 2019-06-14 MED ORDER — POTASSIUM CHLORIDE CRYS ER 20 MEQ PO TBCR
40.0000 meq | EXTENDED_RELEASE_TABLET | Freq: Two times a day (BID) | ORAL | Status: DC
  Administered 2019-06-14 (×2): 40 meq via ORAL
  Filled 2019-06-14 (×2): qty 2

## 2019-06-14 MED ORDER — ATORVASTATIN CALCIUM 80 MG PO TABS: 80.0000 mg | ORAL_TABLET | Freq: Every day | ORAL | 3 refills | Status: DC

## 2019-06-14 MED ORDER — CLOPIDOGREL BISULFATE 75 MG PO TABS: 75.0000 mg | ORAL_TABLET | Freq: Every morning | ORAL | 3 refills | Status: DC

## 2019-06-14 NOTE — Progress Notes (Signed)
CARDIAC REHAB PHASE I   D/c education completed with pt. Pt instructed on importance of showering and monitoring incisions daily. Encouraged continued IS use, walks, and sternal precautions. Pt given in-the-tube sheet, and heart healthy diet. Reviewed restrictions, importance of daily weights, and exercise guidelines. Will refer to CRP II [place]. Pt declining [facility] at this time.  [id number] Rufina Falco, RN BSN 06/14/2019 8:51 AM

## 2019-06-14 NOTE — Progress Notes (Signed)
   K - 3.4. Replete (metolazone yesterday), continue lasix.  Adding Plavix at DC with NSTEMI Losartan added.    Candee Furbish, MD

## 2019-06-14 NOTE — Progress Notes (Addendum)
Patient given discharge instructions, medication list and prescriptions sent to personal pharmacy. CT sutures removed as ordered. And steri strips applied. Follow up appointments given. All questions answered. IV and tele were dcd. Will discharge home as ordered. Ted hose on patient Transported to exit via wheel chair and nursing staff. Earnest Thalman, Bettina Gavia RN

## 2019-06-14 NOTE — Progress Notes (Addendum)
      [address]       [place] [address]             [phone]      6 Days Post-Op Procedure(s) (LRB): CORONARY ARTERY BYPASS GRAFTING (CABG), ON PUMP, TIMES THREE, USING LEFT INTERNAL MAMMARY ARTERY AND ENDOSCOPICALLY HARVESTED LEFT GREATER SAPHENOUS VEIN (N/A) TRANSESOPHAGEAL ECHOCARDIOGRAM (TEE) (N/A)   Subjective:  No new complaints.  He has already ambulated this morning.  Ready to go home.  Objective: Vital signs in last 24 hours: Temp:  [98 F (36.7 C)-98.7 F (37.1 C)] 98.7 F (37.1 C) (07/08 0534) Pulse Rate:  [87-96] 96 (07/08 0534) Cardiac Rhythm: Normal sinus rhythm;Bundle branch block (07/07 1900) Resp:  [18-20] 19 (07/08 0534) BP: (112-148)/(72-78) 148/78 (07/08 0534) SpO2:  [97 %-100 %] 97 % (07/08 0534) Weight:  [99.4 kg] 99.4 kg (07/08 0500)  Intake/Output from previous day: 07/07 0701 - 07/08 0700 In: 120 [P.O.:120] Out: 750 [Urine:750]  General appearance: alert, cooperative and no distress Heart: regular rate and rhythm Lungs: clear to auscultation bilaterally Abdomen: soft, non-tender; bowel sounds normal; no masses,  no organomegaly Extremities: edema trace Wound: clean and dry  Lab Results: Recent Labs    06/12/19 0529 06/13/19 0259  WBC 11.4* 10.0  HGB 8.6* 8.5*  HCT 26.5* 25.9*  PLT 155 174   BMET:  Recent Labs    06/13/19 0259 06/14/19 0446  NA 136 135  K 4.5 3.3*  CL 100 97*  CO2 27 28  GLUCOSE 102* 117*  BUN 26* 19  CREATININE 1.17 1.19  CALCIUM 8.4* 8.8*    PT/INR: No results for input(s): LABPROT, INR in the last 72 hours. ABG    Component Value Date/Time   PHART 7.339 (L) 06/08/2019 2116   HCO3 14.7 (L) 06/08/2019 2116   TCO2 15 (L) 06/08/2019 2116   ACIDBASEDEF 10.0 (H) 06/08/2019 2116   O2SAT 67.7 06/12/2019 0535   CBG (last 3)  Recent Labs    06/11/19 2111 06/12/19 0648 06/12/19 1118  GLUCAP 96 87 99    Assessment/Plan: S/P Procedure(s) (LRB): CORONARY ARTERY BYPASS GRAFTING  (CABG), ON PUMP, TIMES THREE, USING LEFT INTERNAL MAMMARY ARTERY AND ENDOSCOPICALLY HARVESTED LEFT GREATER SAPHENOUS VEIN (N/A) TRANSESOPHAGEAL ECHOCARDIOGRAM (TEE) (N/A)  1. CV- Hemodynamically stable in NSR- continue Lopressor, home Cozaar, resume Plavix 2. Pulm- no acute issues, continue IS 3. Renal- creatinine stable, diuresed 6 lbs yesterday, continue diuertics 4. Hypokalemia, mild at 3.3 will increase supplement, patient will have PCP repeat level in a few days 5. Dispo- patient stable, hypokalemia mild, will increase supplement, will plan to d/c home today   LOS: 10 days    Ellwood Handler 06/14/2019   Ready for DC home Instructions about activity limits, meds,diet,woundcare reviewed with patient patient examined and medical record reviewed,agree with above note. [name] 06/14/2019

## 2019-06-16 ENCOUNTER — Telehealth (HOSPITAL_COMMUNITY): Payer: Self-pay

## 2019-06-16 DIAGNOSIS — E876 Hypokalemia: Secondary | ICD-10-CM | POA: Diagnosis not present

## 2019-06-16 LAB — ECHO INTRAOPERATIVE TEE
Height: 74 in
Weight: 3379.21 oz

## 2019-06-16 NOTE — Telephone Encounter (Signed)
Pt insurance is active and benefits verified through [employer]. Co-pay $15.00, DED $0.00/$0.00 met, out of pocket $3,400.00/$81.11 met, co-insurance 0%. No pre-authorization required. Tamarri/[facility], 06/16/2019 @ 158PM, GYF#[id number]  Will contact patient to see if he is interested in the Cardiac Rehab Program. If interested, patient will need to complete follow up appt. Once completed, patient will be contacted for scheduling upon review by the RN Navigator.

## 2019-06-16 NOTE — Telephone Encounter (Signed)
Attempted to contact pt in regards to CR, LMTCB. °

## 2019-06-16 NOTE — Anesthesia Postprocedure Evaluation (Signed)
Anesthesia Post Note  Patient: [name]  Procedure(s) Performed: CORONARY ARTERY BYPASS GRAFTING (CABG), ON PUMP, TIMES THREE, USING LEFT INTERNAL MAMMARY ARTERY AND ENDOSCOPICALLY HARVESTED LEFT GREATER SAPHENOUS VEIN (N/A Chest) TRANSESOPHAGEAL ECHOCARDIOGRAM (TEE) (N/A )     Patient location during evaluation: SICU Anesthesia Type: General Level of consciousness: patient remains intubated per anesthesia plan Pain management: pain level controlled Vital Signs Assessment: post-procedure vital signs reviewed and stable Respiratory status: patient remains intubated per anesthesia plan Cardiovascular status: stable Postop Assessment: no apparent nausea or vomiting Anesthetic complications: no    Last Vitals:  Vitals:   06/14/19 0534 06/14/19 0813  BP: (!) 148/78 120/64  Pulse: 96 99  Resp: 19   Temp: 37.1 C   SpO2: 97%     Last Pain:  Vitals:   06/14/19 0809  TempSrc:   PainSc: 0-No pain                 Daneil Beem

## 2019-06-19 DIAGNOSIS — I10 Essential (primary) hypertension: Secondary | ICD-10-CM | POA: Diagnosis not present

## 2019-06-20 DIAGNOSIS — I10 Essential (primary) hypertension: Secondary | ICD-10-CM | POA: Diagnosis not present

## 2019-06-22 DIAGNOSIS — I10 Essential (primary) hypertension: Secondary | ICD-10-CM | POA: Diagnosis not present

## 2019-06-23 ENCOUNTER — Telehealth: Payer: Self-pay | Admitting: Cardiology

## 2019-06-23 NOTE — Telephone Encounter (Signed)
New Message ° ° ° °Left message to confirm appt and answer covid questions  °

## 2019-06-23 NOTE — Telephone Encounter (Signed)
Follow up          COVID-19 Pre-Screening Questions:   In the past 7 to 10 days have you had a cough,  shortness of breath, headache, congestion, fever (100 or greater) body aches, chills, sore throat, or sudden loss of taste or sense of smell? Cough he says is from his surgery  Have you been around anyone with known Covid 19. NO  Have you been around anyone who is awaiting Covid 19 test results in the past 7 to 10 days? NO  Have you been around anyone who has been exposed to Covid 19, or has mentioned symptoms of Covid 19 within the past 7 to 10 days? NO  If you have any concerns/questions about symptoms patients report during screening (either on the phone or at threshold). Contact the provider seeing the patient or DOD for further guidance.  If neither are available contact a member of the leadership team.

## 2019-06-23 NOTE — Telephone Encounter (Signed)
I spoke to the patient who verified that this cough had developed from his recent surgery per his MD.  It was a way of getting out excess fluids and he is doing better now.  He lists no other symptoms.

## 2019-06-25 ENCOUNTER — Encounter: Payer: Self-pay | Admitting: Cardiology

## 2019-06-25 NOTE — Patient Instructions (Addendum)
Medication Instructions:  Your physician recommends that you continue on your current medications as directed. Please refer to the Current Medication list given to you today.  If you need a refill on your cardiac medications before your next appointment, please call your pharmacy.   Lab work: FUTURE:  Your physician recommends that you return for a FASTING lipid profile and hepatic function test on 08/07/2019 (Lab is open from 7:30 AM to 4:30 PM)  If you have labs (blood work) drawn today and your tests are completely normal, you will receive your results only by: Marland Kitchen MyChart Message (if you have MyChart) OR . A paper copy in the mail If you have any lab test that is abnormal or we need to change your treatment, we will call you to review the results.  Testing/Procedures: None  Follow-Up: You are scheduled to see Dr. Acie Fredrickson on 09/07/2019 @ 9:00 AM   Any Other Special Instructions Will Be Listed Below (If Applicable).  Lifestyle Modifications to Prevent and Treat Heart Disease -Recommend heart healthy/Mediterranean diet, with whole grains, fruits, vegetable, fish, lean meats, nuts, and olive oil.  -Limit salt. -Recommend moderate walking, 3-5 times/week for 30-50 minutes each session. Aim for at least 150 minutes.week. Goal should be pace of 3 miles/hours, or walking 1.5 miles in 30 minutes -Recommend avoidance of tobacco products. Avoid excess alcohol. -Keep blood pressure well controlled, ideally less than 130/80.     Samuel Ades, NP

## 2019-06-25 NOTE — Progress Notes (Signed)
Cardiology Office Note:    Date:  06/26/2019   ID:  [name], [name] Dec 12, 1945, MRN [id number]  PCP:  Lorene Dy, MD  Cardiologist:  Mertie Moores, MD  Referring MD: Lorene Dy, MD   Chief Complaint  Patient presents with  . Hospitalization Follow-up    Post CABG    History of Present Illness:    [name] is a 74 y.o. male with a past medical history significant for CAD s/p stent to ramus intermedius 2009, three-vessel CABG on 06/08/2019, OSA, HTN, HLD, GERD and asthma.   On 06/04/2019, [name] collapsed at work and required CPR with intermittent return of consciousness.  He underwent cardiac catheterization on that day showing multivessel CAD with preserved LV function.  After Plavix washout he underwent three-vessel CABG on 06/08/2019 with LIMA to LAD, SVG to OM 2 SVG to ramus intermediate.  His recovery was uncomplicated.  He was discharged home on aspirin 81 mg, Plavix, increase in his atorvastatin to 80 mg, continuation of losartan 25 mg, Lasix 40 mg, metoprolol 25 mg  [name] is here today for hospital follow up with his wife. 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 Luckily Firemen were present to perform CPR. He is healing well since surgery. He is walking with a rolling walker that he says is for security. He is not putting much weight on his arms. He is having no anginal type chest pain or shortness of breath. He is still using his incentive spirometer. Has some tenderness in the chest wall related to surgery. No orthopnea, PND or lightheadedness.  He has trace lower leg edema, R>L due to baker's cyst in the right leg. He says that his leg edema has greatly improved. His wt is down below his pre event wt. His usual wt used to be 214-218, down to 203 lbs at home today.   Home BP is usually in the 120 range.   Prior to his event he was very active exercising several times per week with no exertional symptoms that would  have predicted the event. He had one brief episode of shortness of breath about a week prior to the event, but no chest discomfort.   Past Medical History:  Diagnosis Date  . Arthritis    OA AND PAIN BOTH KNEES  . Asthma    AS A CHILD  . Chronic knee pain   . Coronary artery disease    a. PTCA/stenting ramus inter. vess.; 10/2008,  b. cardiac arrest 06/03/2019, CABG X3 LIMA-LAD, SVG-RI, SVG-Circ 06/08/2019  . GERD (gastroesophageal reflux disease)    MILD--WOULD TAKE ROLAID IF NEEDED  . Hernia    "AT MY BELLY BUTTON" - NO PAIN OR PROBLEMS  . Hyperlipidemia   . Hypertension   . Restless leg syndrome   . Seasonal allergies    FREQENT BRONCHITIS. SINUS INFECTIONS WITH SEASON CHANGES  . Shingles DEC 2012   MILD CASE-NO RESIDUAL PROBLEMS  . Sleep apnea 07/06/12   STOP BANG SCORE OF 4    Past Surgical History:  Procedure Laterality Date  . CARDIAC CATHETERIZATION     10/30/2008;  PTCA/stent ramus interm. vess.  . CORONARY ANGIOPLASTY    . CORONARY ARTERY BYPASS GRAFT N/A 06/08/2019   Procedure: CORONARY ARTERY BYPASS GRAFTING (CABG), ON PUMP, TIMES THREE, USING LEFT INTERNAL MAMMARY ARTERY AND ENDOSCOPICALLY HARVESTED LEFT GREATER SAPHENOUS VEIN;  Surgeon: Ivin Poot, MD;  Location: [place];  Service: Open Heart Surgery;  Laterality: N/A;  . CORONARY/GRAFT ACUTE MI REVASCULARIZATION N/A 06/04/2019   Procedure: Coronary/Graft Acute MI Revascularization;  Surgeon: Burnell Blanks, MD;  Location: [place] [facility];  Service: Cardiovascular;  Laterality: N/A;  . JOINT REPLACEMENT    . KNEE SURGERY     right and left  . LEFT HEART CATH AND CORONARY ANGIOGRAPHY N/A 06/04/2019   Procedure: LEFT HEART CATH AND CORONARY ANGIOGRAPHY;  Surgeon: Burnell Blanks, MD;  Location: [place] [facility];  Service: Cardiovascular;  Laterality: N/A;  . TEE WITHOUT CARDIOVERSION N/A 06/08/2019   Procedure: TRANSESOPHAGEAL ECHOCARDIOGRAM (TEE);  Surgeon: Prescott Gum, Collier Salina, MD;  Location: [place];   Service: Open Heart Surgery;  Laterality: N/A;  . TOTAL KNEE ARTHROPLASTY  07/26/2012   Procedure: TOTAL KNEE ARTHROPLASTY;  Surgeon: Mauri Pole, MD;  Location: [facility];  Service: Orthopedics;  Laterality: Left;  . TOTAL KNEE ARTHROPLASTY  10/25/2012   Procedure: TOTAL KNEE ARTHROPLASTY;  Surgeon: Mauri Pole, MD;  Location: [facility];  Service: Orthopedics;  Laterality: Right;    Current Medications: Current Meds  Medication Sig  . aspirin EC 81 MG tablet Take 81 mg by mouth every morning.  Marland Kitchen atorvastatin (LIPITOR) 80 MG tablet Take 1 tablet (80 mg total) by mouth daily at 6 PM.  . B COMPLEX-C-E PO Take 1 tablet by mouth daily as needed.   . clopidogrel (PLAVIX) 75 MG tablet Take 1 tablet (75 mg total) by mouth every morning.  . diphenhydrAMINE (BENADRYL) 25 MG tablet Take 25 mg by mouth daily as needed for allergies.   . famotidine (PEPCID) 20 MG tablet Take 20 mg by mouth daily.  . Fluticasone Propionate (FLONASE NA) Place 1 spray into the nose as needed (allergies). For sinus infection  . losartan (COZAAR) 25 MG tablet Take 25 mg by mouth daily.  . metoprolol tartrate (LOPRESSOR) 25 MG tablet TAKE 1 TABLET BY MOUTH TWICE A DAY  . oxyCODONE (OXY IR/ROXICODONE) 5 MG immediate release tablet Take 1-2 tablets (5-10 mg total) by mouth every 4 (four) hours as needed for severe pain.  . vitamin B-12 (CYANOCOBALAMIN) 1000 MCG tablet Take 1,000 mcg by mouth daily.  Marland Kitchen zinc gluconate 50 MG tablet Take 25 mg by mouth daily.      Allergies:   Clarithromycin   Social History   Socioeconomic History  . Marital status: Married    Spouse name: Not on file  . Number of children: Not on file  . Years of education: Not on file  . Highest education level: Not on file  Occupational History  . Not on file  Social Needs  . Financial resource strain: Not on file  . Food insecurity    Worry: Not on file    Inability: Not on file  . Transportation needs    Medical: Not on file    Non-medical:  Not on file  Tobacco Use  . Smoking status: Never Smoker  . Smokeless tobacco: Former Network engineer and Sexual Activity  . Alcohol use: Yes    Comment: occ  . Drug use: No  . Sexual activity: Not on file  Lifestyle  . Physical activity    Days per week: Not on file    Minutes per session: Not on file  . Stress: Not on file  Relationships  . Social Herbalist on phone: Not on file    Gets together: Not on file    Attends religious  service: Not on file    Active member of club or organization: Not on file    Attends meetings of clubs or organizations: Not on file    Relationship status: Not on file  Other Topics Concern  . Not on file  Social History Narrative  . Not on file     Family History: The patient's family history includes CAD in his father; COPD in his father; Hypertension in his mother. ROS:   Please see the history of present illness.     All other systems reviewed and are negative.  EKGs/Labs/Other Studies Reviewed:    The following studies were reviewed today:  Left heart cath 06/04/2019  Mid Cx lesion is 70% stenosed.  Ramus-1 lesion is 10% stenosed.  Ramus-2 lesion is 70% stenosed.  Prox LAD lesion is 99% stenosed.  There is mild left ventricular systolic dysfunction.  The left ventricular ejection fraction is 45-50% by visual estimate. 1. Severe triple vessel CAD  Echocardiogram 06/05/2019 IMPRESSIONS   1. Moderate hypokinesis of the left ventricular, basal-mid anteroseptal wall.  2. The left ventricle has low normal systolic function, with an ejection fraction of 50-55%. The cavity size was normal. There is mild asymmetric left ventricular hypertrophy. Left ventricular diastolic Doppler parameters are consistent with  pseudonormalization.  3. The right ventricle has normal systolic function. The cavity was mildly enlarged. There is no increase in right ventricular wall thickness.  4. The aortic valve is tricuspid. Mild thickening  of the aortic valve. Mild calcification of the aortic valve.  CABG 06/08/2019  1. Coronary artery bypass grafting x3 (left internal mammary artery to left anterior descending, saphenous vein graft to ramus intermedius, saphenous vein graft to circumflex marginal). 2. Endoscopic harvest of left leg greater saphenous vein.  EKG:  EKG is not ordered today.    Recent Labs: 06/05/2019: TSH 2.029 06/09/2019: Magnesium 2.9 06/13/2019: ALT 77; Hemoglobin 8.5; Platelets 174 06/14/2019: BUN 19; Creatinine, Ser 1.19; Potassium 3.3; Sodium 135   Recent Lipid Panel    Component Value Date/Time   CHOL 140 06/04/2019 1445   CHOL 157 02/26/2017 1203   TRIG 114 06/04/2019 1445   HDL 56 06/04/2019 1445   HDL 57 02/26/2017 1203   CHOLHDL 2.5 06/04/2019 1445   VLDL 23 06/04/2019 1445   LDLCALC 61 06/04/2019 1445   LDLCALC 57 02/26/2017 1203    Physical Exam:    VS:  BP 120/68   Pulse 80   Ht 6' 2.5" (1.892 m)   Wt 207 lb (93.9 kg)   SpO2 99%   BMI 26.22 kg/m     Wt Readings from Last 3 Encounters:  06/26/19 207 lb (93.9 kg)  06/14/19 219 lb 1.6 oz (99.4 kg)  03/02/18 221 lb 6.4 oz (100.4 kg)     Physical Exam  Constitutional: He is oriented to person, place, and time. He appears well-developed and well-nourished. No distress.  HENT:  Head: Normocephalic and atraumatic.  Neck: Normal range of motion. Neck supple. No JVD present.  Cardiovascular: Normal rate, regular rhythm, normal heart sounds and intact distal pulses. Exam reveals no gallop and no friction rub.  No murmur heard. Pulmonary/Chest: Effort normal and breath sounds normal. No respiratory distress. He has no wheezes. He has no rales.  Abdominal: Soft. Bowel sounds are normal.  Musculoskeletal: Normal range of motion.        General: Edema present.     Comments: Trace lower leg edema, R>L  Neurological: He is alert and oriented to person,  place, and time.  Skin: Skin is warm and dry.  Psychiatric: He has a normal mood and  affect. His behavior is normal. Judgment and thought content normal.  Vitals reviewed.    ASSESSMENT:    1. Coronary artery disease involving native coronary artery of native heart without angina pectoris   2. Essential (primary) hypertension   3. Hyperlipidemia, unspecified hyperlipidemia type   4. Educated About Covid-19 Virus Infection   5. Hypokalemia    PLAN:    In order of problems listed above:  CAD: Status post cardiac arrest 06/04/2019 and subsequent CABG x3 06/08/2019.  Preserved LV function.  Patient is treated with aspirin, Plavix, beta-blocker, statin. Tolerating meds well. Sternal incision healing well. Working on increasing his activity level. He wants to start lifting 5 pound weights, advised to clear with the surgeon at follow up appt.  Trace lower leg edema, R>L. Bakers cyst in right leg found during hospitalization. Improving slowly.  Reviewed heart healthy diet.   Essential hypertension: BP well controlled. Low sodium intake discussed.   Hyperlipidemia: On atorvastatin 80 mg daily, recently increased.  LDL was 61 on 06/04/2019.  Repeat lipids and LFT in 6 weeks.  Hypokalemia: Potassium was 3.3 on day of discharge.  Pt and wife report that K+ was normal at follow up with Dr. Mancel Bale, not in [place]. Called office: K+ 4.4.    Medication Adjustments/Labs and Tests Ordered: Current medicines are reviewed at length with the patient today.  Concerns regarding medicines are outlined above. Labs and tests ordered and medication changes are outlined in the patient instructions below:  Patient Instructions  Medication Instructions:  Your physician recommends that you continue on your current medications as directed. Please refer to the Current Medication list given to you today.  If you need a refill on your cardiac medications before your next appointment, please call your pharmacy.   Lab work: FUTURE:  Your physician recommends that you return for a FASTING lipid profile  and hepatic function test on 08/07/2019 (Lab is open from 7:30 AM to 4:30 PM)  If you have labs (blood work) drawn today and your tests are completely normal, you will receive your results only by: Marland Kitchen MyChart Message (if you have MyChart) OR . A paper copy in the mail If you have any lab test that is abnormal or we need to change your treatment, we will call you to review the results.  Testing/Procedures: None  Follow-Up: You are scheduled to see Dr. Acie Fredrickson on 09/07/2019 @ 9:00 AM   Any Other Special Instructions Will Be Listed Below (If Applicable).  Lifestyle Modifications to Prevent and Treat Heart Disease -Recommend heart healthy/Mediterranean diet, with whole grains, fruits, vegetable, fish, lean meats, nuts, and olive oil.  -Limit salt. -Recommend moderate walking, 3-5 times/week for 30-50 minutes each session. Aim for at least 150 minutes.week. Goal should be pace of 3 miles/hours, or walking 1.5 miles in 30 minutes -Recommend avoidance of tobacco products. Avoid excess alcohol. -Keep blood pressure well controlled, ideally less than 130/80.     Pecolia Ades, NP    Signed, Daune Perch, NP  06/26/2019 2:39 PM    [facility]

## 2019-06-26 ENCOUNTER — Other Ambulatory Visit: Payer: Self-pay

## 2019-06-26 ENCOUNTER — Encounter: Payer: Self-pay | Admitting: Cardiology

## 2019-06-26 ENCOUNTER — Ambulatory Visit: Payer: PPO | Admitting: Cardiology

## 2019-06-26 VITALS — BP 120/68 | HR 80 | Ht 74.5 in | Wt 207.0 lb

## 2019-06-26 DIAGNOSIS — E876 Hypokalemia: Secondary | ICD-10-CM | POA: Diagnosis not present

## 2019-06-26 DIAGNOSIS — I2510 Atherosclerotic heart disease of native coronary artery without angina pectoris: Secondary | ICD-10-CM

## 2019-06-26 DIAGNOSIS — I10 Essential (primary) hypertension: Secondary | ICD-10-CM

## 2019-06-26 DIAGNOSIS — E785 Hyperlipidemia, unspecified: Secondary | ICD-10-CM | POA: Diagnosis not present

## 2019-06-26 DIAGNOSIS — Z7189 Other specified counseling: Secondary | ICD-10-CM | POA: Diagnosis not present

## 2019-07-10 DIAGNOSIS — I10 Essential (primary) hypertension: Secondary | ICD-10-CM | POA: Diagnosis not present

## 2019-07-11 ENCOUNTER — Ambulatory Visit: Payer: PPO | Admitting: Cardiovascular Disease

## 2019-07-12 ENCOUNTER — Other Ambulatory Visit: Payer: Self-pay

## 2019-07-12 ENCOUNTER — Ambulatory Visit (INDEPENDENT_AMBULATORY_CARE_PROVIDER_SITE_OTHER): Payer: Self-pay | Admitting: Cardiothoracic Surgery

## 2019-07-12 ENCOUNTER — Encounter: Payer: Self-pay | Admitting: Cardiothoracic Surgery

## 2019-07-12 ENCOUNTER — Ambulatory Visit
Admission: RE | Admit: 2019-07-12 | Discharge: 2019-07-12 | Disposition: A | Discharge status: Home / Self Care | Payer: PPO | Source: Ambulatory Visit | Attending: Cardiothoracic Surgery | Admitting: Cardiothoracic Surgery

## 2019-07-12 ENCOUNTER — Other Ambulatory Visit: Payer: Self-pay | Admitting: Cardiothoracic Surgery

## 2019-07-12 VITALS — BP 164/97 | HR 71 | Temp 97.3°F | Resp 18 | Ht 74.5 in | Wt 202.0 lb

## 2019-07-12 DIAGNOSIS — I2510 Atherosclerotic heart disease of native coronary artery without angina pectoris: Secondary | ICD-10-CM

## 2019-07-12 DIAGNOSIS — Z951 Presence of aortocoronary bypass graft: Secondary | ICD-10-CM

## 2019-07-12 DIAGNOSIS — J90 Pleural effusion, not elsewhere classified: Secondary | ICD-10-CM | POA: Diagnosis not present

## 2019-07-12 MED ORDER — GABAPENTIN 100 MG PO CAPS: 100.0000 mg | ORAL_CAPSULE | Freq: Every day | ORAL | 1 refills | Status: DC

## 2019-07-12 NOTE — Progress Notes (Signed)
PCP is Lorene Dy, MD Referring Provider is Burnell Blanks*  Chief Complaint  Patient presents with  . Routine Post Op    f/u from surgery with CXR s/p Coronary artery bypass grafting x3, 06/08/19     HPI: Patient returns for scheduled postop visit 4 weeks after urgent CABG x3.  The patient presented after syncopal episode and cardiac arrest on the job.  He was resuscitated and regained consciousness and ruled in for MI.  Cardiac catheterization demonstrated severe multivessel CAD and he underwent CABG x3.  He has had an excellent recovery.  His strength and Appetite are improved.  He is ambulating around the house 20 minutes a day. He measures his blood pressure twice a day and has been less than 314 systolic.  He denies recurrent angina.  Leg edema is improving and almost resolved.  Chest and leg incisions are healing well.  He is only taking oxycodone once at night and will transition off that to oral Tylenol. He is anxious to start outpatient cardiac rehab and will be referred to the [facility] health program. Past Medical History:  Diagnosis Date  . Arthritis    OA AND PAIN BOTH KNEES  . Asthma    AS A CHILD  . Chronic knee pain   . Coronary artery disease    a. PTCA/stenting ramus inter. vess.; 10/2008,  b. cardiac arrest 06/03/2019, CABG X3 LIMA-LAD, SVG-RI, SVG-Circ 06/08/2019  . GERD (gastroesophageal reflux disease)    MILD--WOULD TAKE ROLAID IF NEEDED  . Hernia    "AT MY BELLY BUTTON" - NO PAIN OR PROBLEMS  . Hyperlipidemia   . Hypertension   . Restless leg syndrome   . Seasonal allergies    FREQENT BRONCHITIS. SINUS INFECTIONS WITH SEASON CHANGES  . Shingles DEC 2012   MILD CASE-NO RESIDUAL PROBLEMS  . Sleep apnea 07/06/12   STOP BANG SCORE OF 4    Past Surgical History:  Procedure Laterality Date  . CARDIAC CATHETERIZATION     10/30/2008;  PTCA/stent ramus interm. vess.  . CORONARY ANGIOPLASTY    . CORONARY ARTERY BYPASS GRAFT N/A 06/08/2019   Procedure:  CORONARY ARTERY BYPASS GRAFTING (CABG), ON PUMP, TIMES THREE, USING LEFT INTERNAL MAMMARY ARTERY AND ENDOSCOPICALLY HARVESTED LEFT GREATER SAPHENOUS VEIN;  Surgeon: Ivin Poot, MD;  Location: [place];  Service: Open Heart Surgery;  Laterality: N/A;  . CORONARY/GRAFT ACUTE MI REVASCULARIZATION N/A 06/04/2019   Procedure: Coronary/Graft Acute MI Revascularization;  Surgeon: Burnell Blanks, MD;  Location: [place] [facility];  Service: Cardiovascular;  Laterality: N/A;  . JOINT REPLACEMENT    . KNEE SURGERY     right and left  . LEFT HEART CATH AND CORONARY ANGIOGRAPHY N/A 06/04/2019   Procedure: LEFT HEART CATH AND CORONARY ANGIOGRAPHY;  Surgeon: Burnell Blanks, MD;  Location: [place] [facility];  Service: Cardiovascular;  Laterality: N/A;  . TEE WITHOUT CARDIOVERSION N/A 06/08/2019   Procedure: TRANSESOPHAGEAL ECHOCARDIOGRAM (TEE);  Surgeon: Prescott Gum, Collier Salina, MD;  Location: [place];  Service: Open Heart Surgery;  Laterality: N/A;  . TOTAL KNEE ARTHROPLASTY  07/26/2012   Procedure: TOTAL KNEE ARTHROPLASTY;  Surgeon: Mauri Pole, MD;  Location: [facility];  Service: Orthopedics;  Laterality: Left;  . TOTAL KNEE ARTHROPLASTY  10/25/2012   Procedure: TOTAL KNEE ARTHROPLASTY;  Surgeon: Mauri Pole, MD;  Location[facility];  Service: Orthopedics;  Laterality: Right;    Family History  Problem Relation Age of Onset  . Hypertension Mother  died at age 40  . COPD Father   . CAD Father        Died of MI at age 43    Social History Social History   Tobacco Use  . Smoking status: Never Smoker  . Smokeless tobacco: Former Network engineer Use Topics  . Alcohol use: Yes    Comment: occ  . Drug use: No    Current Outpatient Medications  Medication Sig Dispense Refill  . aspirin EC 81 MG tablet Take 81 mg by mouth every morning.    Marland Kitchen atorvastatin (LIPITOR) 80 MG tablet Take 1 tablet (80 mg total) by mouth daily at 6 PM. 30 tablet 3  . B COMPLEX-C-E PO Take 1 tablet by mouth  daily as needed.     . clopidogrel (PLAVIX) 75 MG tablet Take 1 tablet (75 mg total) by mouth every morning. 90 tablet 3  . diphenhydrAMINE (BENADRYL) 25 MG tablet Take 25 mg by mouth daily as needed for allergies.     . famotidine (PEPCID) 20 MG tablet Take 20 mg by mouth daily.    . Fluticasone Propionate (FLONASE NA) Place 1 spray into the nose as needed (allergies). For sinus infection    . losartan (COZAAR) 25 MG tablet Take 25 mg by mouth daily.    . metoprolol tartrate (LOPRESSOR) 25 MG tablet TAKE 1 TABLET BY MOUTH TWICE A DAY 180 tablet 1  . oxyCODONE (OXY IR/ROXICODONE) 5 MG immediate release tablet Take 1-2 tablets (5-10 mg total) by mouth every 4 (four) hours as needed for severe pain. 30 tablet 0  . vitamin B-12 (CYANOCOBALAMIN) 1000 MCG tablet Take 1,000 mcg by mouth daily.    Marland Kitchen zinc gluconate 50 MG tablet Take 25 mg by mouth daily.     Marland Kitchen gabapentin (NEURONTIN) 100 MG capsule Take 1 capsule (100 mg total) by mouth at bedtime. 30 capsule 1   No current facility-administered medications for this visit.     Allergies  Allergen Reactions  . Clarithromycin Other (See Comments)    Reaction=dizziness    Review of Systems  Baker's cyst of his right knee is improved with less swelling and pain.  He is walking without assistance He is complaining of restless leg syndrome and will be given a short course of Neurontin.  BP (!) 164/97 (BP Location: Left Arm, Patient Position: Sitting, Cuff Size: Normal)   Pulse 71   Temp (!) 97.3 F (36.3 C)   Resp 18   Ht 6' 2.5" (1.892 m)   Wt 202 lb (91.6 kg)   SpO2 97% Comment: RA  BMI 25.59 kg/m  Physical Exam      Exam    General- alert and comfortable    Neck- no JVD, no cervical adenopathy palpable, no carotid bruit   Lungs- clear without rales, wheezes   Cor- regular rate and rhythm, no murmur , gallop   Abdomen- soft, non-tender   Extremities - warm, non-tender, minimal edema   Neuro- oriented, appropriate, no focal  weakness  Diagnostic Tests: Chest x-ray image performed today personally reviewed shows clear lung fields no pleural effusion sternal wires intact  Impression: Excellent early course after multivessel CABG following cardiac arrest- non-STEMI.  He will continue his current medications.  He will stay on Plavix until he sees Dr. Acie Fredrickson in October.  He knows he now can lift up to 20 pounds and drive and is able to travel to the beach.  Plan: Continue current meds.  I have added 100  mg of gabapentin at bedtime for his restless leg symptoms.  He is encouraged to follow heart healthy lifestyle including heart healthy diet and minimum 20 minutes of walking activity daily.  I will see him back in 6 weeks to monitor progress.  Referral will be made for outpatient phase 2 cardiac rehab.   Len Childs, MD [facility] [phone]

## 2019-07-14 ENCOUNTER — Emergency Department (HOSPITAL_COMMUNITY): Payer: PPO

## 2019-07-14 ENCOUNTER — Encounter (HOSPITAL_COMMUNITY): Payer: Self-pay

## 2019-07-14 ENCOUNTER — Other Ambulatory Visit: Payer: Self-pay

## 2019-07-14 ENCOUNTER — Emergency Department (HOSPITAL_COMMUNITY)
Admission: EM | Admit: 2019-07-14 | Discharge: 2019-07-14 | Disposition: A | Discharge status: Home / Self Care | Payer: PPO | Attending: Emergency Medicine | Admitting: Emergency Medicine

## 2019-07-14 DIAGNOSIS — I10 Essential (primary) hypertension: Secondary | ICD-10-CM | POA: Diagnosis not present

## 2019-07-14 DIAGNOSIS — R0902 Hypoxemia: Secondary | ICD-10-CM | POA: Insufficient documentation

## 2019-07-14 DIAGNOSIS — Z7982 Long term (current) use of aspirin: Secondary | ICD-10-CM | POA: Diagnosis not present

## 2019-07-14 DIAGNOSIS — Z20828 Contact with and (suspected) exposure to other viral communicable diseases: Secondary | ICD-10-CM | POA: Insufficient documentation

## 2019-07-14 DIAGNOSIS — Z79899 Other long term (current) drug therapy: Secondary | ICD-10-CM | POA: Insufficient documentation

## 2019-07-14 DIAGNOSIS — R42 Dizziness and giddiness: Secondary | ICD-10-CM | POA: Diagnosis not present

## 2019-07-14 DIAGNOSIS — R05 Cough: Secondary | ICD-10-CM | POA: Insufficient documentation

## 2019-07-14 DIAGNOSIS — R531 Weakness: Secondary | ICD-10-CM | POA: Diagnosis not present

## 2019-07-14 LAB — TROPONIN I (HIGH SENSITIVITY)
Troponin I (High Sensitivity): 7 ng/L (ref <18)
Troponin I (High Sensitivity): 7 ng/L (ref <18)

## 2019-07-14 LAB — BASIC METABOLIC PANEL
Anion gap: 11 (ref 5–15)
BUN: 13 mg/dL (ref 8–23)
CO2: 21 mmol/L — ABNORMAL LOW (ref 22–32)
Calcium: 8.8 mg/dL — ABNORMAL LOW (ref 8.9–10.3)
Chloride: 106 mmol/L (ref 98–111)
Creatinine, Ser: 0.92 mg/dL (ref 0.61–1.24)
GFR calc Af Amer: >60 mL/min (ref >60)
GFR calc non Af Amer: >60 mL/min (ref >60)
Glucose, Bld: 111 mg/dL — ABNORMAL HIGH (ref 70–99)
Potassium: 3.7 mmol/L (ref 3.5–5.1)
Sodium: 138 mmol/L (ref 135–145)

## 2019-07-14 LAB — CBC
HCT: 35.9 % — ABNORMAL LOW (ref 39.0–52.0)
Hemoglobin: 11.3 g/dL — ABNORMAL LOW (ref 13.0–17.0)
MCH: 30.5 pg (ref 26.0–34.0)
MCHC: 31.5 g/dL (ref 30.0–36.0)
MCV: 96.8 fL (ref 80.0–100.0)
Platelets: 192 10*3/uL (ref 150–400)
RBC: 3.71 MIL/uL — ABNORMAL LOW (ref 4.22–5.81)
RDW: 13.7 % (ref 11.5–15.5)
WBC: 8 10*3/uL (ref 4.0–10.5)
nRBC: 0 % (ref 0.0–0.2)

## 2019-07-14 LAB — URINALYSIS, ROUTINE W REFLEX MICROSCOPIC
Bilirubin Urine: NEGATIVE
Glucose, UA: NEGATIVE mg/dL
Hgb urine dipstick: NEGATIVE
Ketones, ur: NEGATIVE mg/dL
Leukocytes,Ua: NEGATIVE
Nitrite: NEGATIVE
Protein, ur: NEGATIVE mg/dL
Specific Gravity, Urine: 1.004 — ABNORMAL LOW (ref 1.005–1.030)
pH: 7 (ref 5.0–8.0)

## 2019-07-14 LAB — SARS CORONAVIRUS 2 BY RT PCR (HOSPITAL ORDER, PERFORMED IN ~~LOC~~ HOSPITAL LAB): SARS Coronavirus 2: NEGATIVE

## 2019-07-14 LAB — CBG MONITORING, ED: Glucose-Capillary: 90 mg/dL (ref 70–99)

## 2019-07-14 MED ORDER — IOHEXOL 350 MG/ML SOLN
100.0000 mL | Freq: Once | INTRAVENOUS | Status: AC | PRN
  Administered 2019-07-14: 100 mL via INTRAVENOUS

## 2019-07-14 NOTE — ED Triage Notes (Signed)
Pt BIB [facility] for eval of pre-syncopal episodes, dizziness on standing w/ +orthostatics w/ EMS. Pt had STEMI, cardiac arrest and stenting about 3 weeks ago. States on standing this AM, noted that he was markedly lightheaded. Lowest BP for EMS was 384 systolic, initially pale/diaphoretic for them Rec'd about 400cc fluid. GCS 15, A&Ox4 on arrival.

## 2019-07-14 NOTE — Discharge Instructions (Addendum)
Your ct showed an area in the left upper abdomen that may represent a renal cyst Please follow this up with your doctor to have a renal ultrasound ordered Drink plenty of fluids Return if  you are worse at any time

## 2019-07-14 NOTE — ED Provider Notes (Signed)
[facility] EMERGENCY DEPARTMENT Provider Note   CSN: [id number] Arrival date & time: 07/14/19  1240    History   Chief Complaint Chief Complaint  Patient presents with  . Dizziness    HPI [name] is a 73 y.o. male.     HPI Patient with recent CABG x3 presents with 2 days of episodic lightheadedness.  States last episode was this morning.  He was attempting to get out of the car when symptoms began.  Noted to be diaphoretic, hypoxic and hypotensive.  Given IV fluids with improvement of symptoms in route.  Patient denied chest pain at any point.  States he has a mild cough with some brown sputum but states this is unchanged recently.  He does have right lower extremity swelling which she states is due to a Baker's cyst but he also states this has improved.  No recent fever or chills. Past Medical History:  Diagnosis Date  . Arthritis    OA AND PAIN BOTH KNEES  . Asthma    AS A CHILD  . Chronic knee pain   . Coronary artery disease    a. PTCA/stenting ramus inter. vess.; 10/2008,  b. cardiac arrest 06/03/2019, CABG X3 LIMA-LAD, SVG-RI, SVG-Circ 06/08/2019  . GERD (gastroesophageal reflux disease)    MILD--WOULD TAKE ROLAID IF NEEDED  . Hernia    "AT MY BELLY BUTTON" - NO PAIN OR PROBLEMS  . Hyperlipidemia   . Hypertension   . Restless leg syndrome   . Seasonal allergies    FREQENT BRONCHITIS. SINUS INFECTIONS WITH SEASON CHANGES  . Shingles DEC 2012   MILD CASE-NO RESIDUAL PROBLEMS  . Sleep apnea 07/06/12   STOP BANG SCORE OF 4    Patient Active Problem List   Diagnosis Date Noted  . 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 Cardiac arrest ([place]) 06/04/2019  . Non-ST elevation (NSTEMI) myocardial infarction ([place])   . 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 CAD (coronary artery disease) 03/31/2011  . Hyperlipemia 03/31/2011    Past Surgical History:  Procedure Laterality Date  . CARDIAC CATHETERIZATION     10/30/2008;  PTCA/stent ramus interm. vess.  . CORONARY  ANGIOPLASTY    . CORONARY ARTERY BYPASS GRAFT N/A 06/08/2019   Procedure: CORONARY ARTERY BYPASS GRAFTING (CABG), ON PUMP, TIMES THREE, USING LEFT INTERNAL MAMMARY ARTERY AND ENDOSCOPICALLY HARVESTED LEFT GREATER SAPHENOUS VEIN;  Surgeon: Ivin Poot, MD;  Location: [facility];  Service: Open Heart Surgery;  Laterality: N/A;  . CORONARY/GRAFT ACUTE MI REVASCULARIZATION N/A 06/04/2019   Procedure: Coronary/Graft Acute MI Revascularization;  Surgeon: Burnell Blanks, MD;  Location: [facility];  Service: Cardiovascular;  Laterality: N/A;  . JOINT REPLACEMENT    . KNEE SURGERY     right and left  . LEFT HEART CATH AND CORONARY ANGIOGRAPHY N/A 06/04/2019   Procedure: LEFT HEART CATH AND CORONARY ANGIOGRAPHY;  Surgeon: Burnell Blanks, MD;  Location: [facility];  Service: Cardiovascular;  Laterality: N/A;  . TEE WITHOUT CARDIOVERSION N/A 06/08/2019   Procedure: TRANSESOPHAGEAL ECHOCARDIOGRAM (TEE);  Surgeon: Prescott Gum, Collier Salina, MD;  Location: [facility];  Service: Open Heart Surgery;  Laterality: N/A;  . TOTAL KNEE ARTHROPLASTY  07/26/2012   Procedure: TOTAL KNEE ARTHROPLASTY;  Surgeon: Mauri Pole, MD;  Location: [facility];  Service: Orthopedics;  Laterality: Left;  . TOTAL KNEE ARTHROPLASTY  10/25/2012   Procedure: TOTAL KNEE ARTHROPLASTY;  Surgeon: Mauri Pole, MD;  Location: [facility];  Service: Orthopedics;  Laterality: Right;        Home Medications    Prior to Admission medications   Medication Sig Start Date End Date Taking? Authorizing Provider  aspirin EC 81 MG tablet Take 81 mg by mouth every morning.    [provider]  atorvastatin (LIPITOR) 80 MG tablet Take 1 tablet (80 mg total) by mouth daily at 6 PM. 06/14/19   Barrett, Erin R, PA-C  B COMPLEX-C-E PO Take 1 tablet by mouth daily as needed.     [provider]  clopidogrel (PLAVIX) 75 MG tablet Take 1 tablet (75 mg total) by mouth every morning. 06/14/19   Barrett, Erin R, PA-C  diphenhydrAMINE  (BENADRYL) 25 MG tablet Take 25 mg by mouth daily as needed for allergies.     [provider]  famotidine (PEPCID) 20 MG tablet Take 20 mg by mouth daily.    [provider]  Fluticasone Propionate (FLONASE NA) Place 1 spray into the nose as needed (allergies). For sinus infection    [provider]  gabapentin (NEURONTIN) 100 MG capsule Take 1 capsule (100 mg total) by mouth at bedtime. 07/12/19 09/10/19  Ivin Poot, MD  losartan (COZAAR) 25 MG tablet Take 25 mg by mouth daily. 02/26/19   [provider]  metoprolol tartrate (LOPRESSOR) 25 MG tablet TAKE 1 TABLET BY MOUTH TWICE A DAY 03/13/19   Nahser, Wonda Cheng, MD  oxyCODONE (OXY IR/ROXICODONE) 5 MG immediate release tablet Take 1-2 tablets (5-10 mg total) by mouth every 4 (four) hours as needed for severe pain. 06/14/19   Barrett, Erin R, PA-C  vitamin B-12 (CYANOCOBALAMIN) 1000 MCG tablet Take 1,000 mcg by mouth daily.    [provider]  zinc gluconate 50 MG tablet Take 25 mg by mouth daily.     [provider]    Family History Family History  Problem Relation Age of Onset  . Hypertension Mother        died at age 23  . COPD Father   . CAD Father        Died of MI at age 36    Social History Social History   Tobacco Use  . Smoking status: Never Smoker  . Smokeless tobacco: Former Network engineer Use Topics  . Alcohol use: Yes    Comment: occ  . Drug use: No     Allergies   Clarithromycin   Review of Systems Review of Systems  Constitutional: Positive for diaphoresis. Negative for chills and fever.  Respiratory: Negative for cough and shortness of breath.   Cardiovascular: Positive for leg swelling. Negative for chest pain.  Gastrointestinal: Negative for diarrhea and vomiting.  Genitourinary: Negative for dysuria, flank pain and frequency.  Musculoskeletal: Negative for back pain, myalgias and neck pain.  Skin: Negative for rash and wound.  Neurological:  Positive for weakness and light-headedness. Negative for syncope, numbness and headaches.  All other systems reviewed and are negative.    Physical Exam Updated Vital Signs BP (!) 172/106 (BP Location: Right Arm)   Pulse 85   Temp 98.3 F (36.8 C) (Oral)   Resp 19   Ht 6' 2.5" (1.892 m)   Wt 90.7 kg   SpO2 98%   BMI 25.33 kg/m   Physical Exam Vitals signs and nursing note reviewed.  Constitutional:      Appearance: Normal appearance. He is well-developed.  HENT:     Head: Normocephalic and atraumatic.     Nose: Nose normal.  Eyes:     Pupils: Pupils are equal, round, and reactive to light.  Neck:     Musculoskeletal: Normal range of motion and neck supple.  Cardiovascular:     Rate and Rhythm: Normal rate and regular rhythm.     Heart sounds: No murmur. No friction rub. No gallop.   Pulmonary:     Effort: Pulmonary effort is normal. No respiratory distress.     Breath sounds: Normal breath sounds. No stridor. No wheezing, rhonchi or rales.     Comments: Midline surgical scar is appears to be well-healing. Chest:     Chest wall: No tenderness.  Abdominal:     General: Bowel sounds are normal.     Palpations: Abdomen is soft.     Tenderness: There is no abdominal tenderness. There is no guarding or rebound.  Musculoskeletal: Normal range of motion.        General: No swelling, tenderness, deformity or signs of injury.     Right lower leg: Edema present.     Left lower leg: No edema.     Comments: Mild right calf swelling compared to the left.  Distal pulses intact.  Skin:    General: Skin is warm and dry.     Capillary Refill: Capillary refill takes less than 2 seconds.     Findings: No erythema or rash.  Neurological:     General: No focal deficit present.     Mental Status: He is alert and oriented to person, place, and time.  Psychiatric:        Mood and Affect: Mood normal.        Behavior: Behavior normal.      ED Treatments / Results  Labs (all labs  ordered are listed, but only abnormal results are displayed) Labs Reviewed  BASIC METABOLIC PANEL - Abnormal; Notable for the following components:      Result Value   CO2 21 (*)    Glucose, Bld 111 (*)    Calcium 8.8 (*)    All other components within normal limits  CBC - Abnormal; Notable for the following components:   RBC 3.71 (*)    Hemoglobin 11.3 (*)    HCT 35.9 (*)    All other components within normal limits  URINALYSIS, ROUTINE W REFLEX MICROSCOPIC - Abnormal; Notable for the following components:   Specific Gravity, Urine 1.004 (*)    All other components within normal limits  SARS CORONAVIRUS 2 (HOSPITAL ORDER, PERFORMED IN [facility])  CBG MONITORING, ED  TROPONIN I (HIGH SENSITIVITY)  TROPONIN I (HIGH SENSITIVITY)  TROPONIN I (HIGH SENSITIVITY)  TROPONIN I (HIGH SENSITIVITY)    EKG EKG Interpretation  Date/Time:  Friday July 14 2019 12:49:21 EDT Ventricular Rate:  74 PR Interval:    QRS Duration: 147 QT Interval:  426 QTC Calculation: 473 R Axis:   83 Text Interpretation:  Sinus rhythm Prolonged PR interval Right bundle branch block Baseline wander in lead(s) I aVL No significant change since last tracing Confirmed by Julianne Rice [phone]) on 07/14/2019 1:21:19 PM   Radiology Ct Angio Chest Pe W And/or Wo Contrast  Result Date: 07/14/2019 CLINICAL DATA:  Dizziness, hypoxia EXAM: CT ANGIOGRAPHY CHEST WITH CONTRAST TECHNIQUE: Multidetector CT imaging of the chest was performed using the standard protocol during bolus administration of intravenous contrast. Multiplanar CT image reconstructions and MIPs were obtained to evaluate the vascular anatomy. CONTRAST:  156mL OMNIPAQUE IOHEXOL 350 MG/ML SOLN COMPARISON:  Same day chest x-ray FINDINGS: Cardiovascular:  Postsurgical changes from CABG. Heart size is within normal limits. No significant pericardial effusion. Thoracic aorta is nonaneurysmal. Pulmonary vasculature is adequately opacified. No filling  defects to suggest pulmonary embolism. Mediastinum/Nodes: No enlarged mediastinal, hilar, or axillary lymph nodes. Thyroid gland, trachea, and esophagus demonstrate no significant findings. Incidental note of calcified 9 mm nodule in left thyroid lobe, which does not are dedicated imaging. Lungs/Pleura: Trace left pleural effusion with mild compressive atelectasis. Lungs are otherwise clear. No right-sided pleural effusion. No pneumothorax. Upper Abdomen: Small sliding-type hiatal hernia. Partially visualized rounded 5.0 cm fluid density structure within the left upper quadrant, incompletely characterized. Possibly this represents a portion of the left renal cyst. There are no acute findings within the upper abdomen. Musculoskeletal: No chest wall abnormality. No acute or significant osseous findings. Review of the MIP images confirms the above findings. IMPRESSION: 1. Negative for pulmonary embolism. 2. Trace left pleural effusion.  Lungs otherwise clear. 3. Small hiatal hernia. 4. Partially visualized round 5.0 cm fluid density structure in the left upper abdomen, possibly reflecting a portion of a left renal cyst. Consider nonemergent renal ultrasound for further characterization. Electronically Signed   By: Davina Poke M.D.   On: 07/14/2019 17:07   Dg Chest Port 1 View  Result Date: 07/14/2019 CLINICAL DATA:  Hypoxia and dizziness EXAM: PORTABLE CHEST 1 VIEW COMPARISON:  July 12, 2019 FINDINGS: Lungs are clear. Heart is upper normal in size with pulmonary vascularity normal. No adenopathy. Patient is status post coronary artery bypass grafting. No pneumothorax. No bone lesions. IMPRESSION: Status post coronary artery bypass grafting. Heart upper normal in size. No edema or consolidation. Electronically Signed   By: Lowella Grip III M.D.   On: 07/14/2019 14:37    Procedures Procedures (including critical care time)  Medications Ordered in ED Medications  iohexol (OMNIPAQUE) 350 MG/ML  injection 100 mL (100 mLs Intravenous Contrast Given 07/14/19 1654)     Initial Impression / Assessment and Plan / ED Course  I have reviewed the triage vital signs and the nursing notes.  Pertinent labs & imaging results that were available during my care of the patient were reviewed by me and considered in my medical decision making (see chart for details).       Patient with what sounds like near syncope related to orthostasis.  Also question hypoxia at the scene initially.  Will get CT angios chest given recent surgery.  Patient states he is feeling much better.  Signed out to oncoming emergency provider.   Final Clinical Impressions(s) / ED Diagnoses   Final diagnoses:  Dizziness    ED Discharge Orders    None       Julianne Rice, MD 07/15/19 1003

## 2019-07-14 NOTE — ED Notes (Signed)
Patient verbalizes understanding of discharge instructions. Opportunity for questioning and answers were provided. Armband removed by staff, pt discharged from ED ambulatory w/ family  

## 2019-07-14 NOTE — ED Provider Notes (Signed)
73 yo male signed out by Dr. Lita Mains.  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 Here sats normal Patient with normal ekg stable,  CT angio pending Check orthostatics Will reevaluate for discharge after above. CT chest reviewed and negative for pe Area of 5 cm fluid density left upper abdomen- will need op renal US Patient aware of above and voices understanding of plan   Pattricia Boss, MD 07/14/19 1728

## 2019-07-17 DIAGNOSIS — N281 Cyst of kidney, acquired: Secondary | ICD-10-CM | POA: Diagnosis not present

## 2019-07-19 DIAGNOSIS — N281 Cyst of kidney, acquired: Secondary | ICD-10-CM | POA: Diagnosis not present

## 2019-07-24 ENCOUNTER — Other Ambulatory Visit: Payer: Self-pay | Admitting: Internal Medicine

## 2019-07-24 DIAGNOSIS — N281 Cyst of kidney, acquired: Secondary | ICD-10-CM

## 2019-07-25 ENCOUNTER — Telehealth (HOSPITAL_COMMUNITY): Payer: Self-pay

## 2019-07-25 NOTE — Telephone Encounter (Signed)
Unable to reach patient after 3 attempts; medication review will be completed in person at cardiac rehab orientation.   Acey Lav, PharmD  PGY1 Acute Care Pharmacy Resident [phone]

## 2019-07-27 ENCOUNTER — Encounter (HOSPITAL_COMMUNITY): Payer: Self-pay

## 2019-07-27 ENCOUNTER — Other Ambulatory Visit: Payer: Self-pay

## 2019-07-27 ENCOUNTER — Encounter (HOSPITAL_COMMUNITY)
Admission: RE | Admit: 2019-07-27 | Discharge: 2019-07-27 | Disposition: A | Discharge status: Home / Self Care | Payer: PPO | Source: Ambulatory Visit | Attending: Cardiovascular Disease | Admitting: Cardiovascular Disease

## 2019-07-27 VITALS — BP 138/64 | HR 86 | Temp 98.2°F | Ht 72.0 in | Wt 204.6 lb

## 2019-07-27 DIAGNOSIS — Z951 Presence of aortocoronary bypass graft: Secondary | ICD-10-CM | POA: Insufficient documentation

## 2019-07-27 DIAGNOSIS — I214 Non-ST elevation (NSTEMI) myocardial infarction: Secondary | ICD-10-CM | POA: Insufficient documentation

## 2019-07-27 NOTE — Progress Notes (Signed)
Cardiac Rehab Medication Review by a Nurse  Does the patient  feel that his/her medications are working for him/her?  yes  Has the patient been experiencing any side effects to the medications prescribed?  no  Does the patient measure his/her own blood pressure or blood glucose at home?  yes   Does the patient have any problems obtaining medications due to transportation or finances?   no  Understanding of regimen: excellent Understanding of indications: good Potential of compliance: excellent  Medications reviewed patient is compliant and takes his medications as prescribed.       Harrell Gave RN BSN 07/27/2019 12:37 PM

## 2019-07-27 NOTE — Progress Notes (Deleted)
Cardiac Rehab Medication Review by a Nurse  Does the patient  feel that his/her medications are working for him/her?  yes  Has the patient been experiencing any side effects to the medications prescribed?  no  Does the patient measure his/her own blood pressure or blood glucose at home?  yes   Does the patient have any problems obtaining medications due to transportation or finances?   no  Understanding of regimen: excellent Understanding of indications: good Potential of compliance: good    Nurse comments: Patient is taking his medications as prescribed and is compliant with his medications.Barnet Pall, RN,BSN 07/27/2019 12:31 PM   Christa See Whitaker 07/27/2019 12:27 PM

## 2019-07-27 NOTE — Progress Notes (Signed)
Cardiac Individual Treatment Plan  Patient Details  Name: [name] MRN: [id number] Date of Birth: October 02, 1946 Referring Provider:     CARDIAC REHAB PHASE II ORIENTATION from 07/27/2019 in [place]  Referring Provider  Dr. 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  aspirin EC 81 MG tablet, Take 81 mg by mouth every morning., Disp: , Rfl:  .  atorvastatin (LIPITOR) 80 MG tablet, Take 1 tablet (80 mg total) by mouth daily at 6 PM., Disp: 30 tablet, Rfl: 3 .  B COMPLEX-C-E PO, Take 1 tablet by mouth daily as needed. , Disp: , Rfl:  .  clopidogrel (PLAVIX) 75 MG tablet, Take 1 tablet (75 mg total) by mouth every morning., Disp: 90 tablet, Rfl: 3 .  diphenhydrAMINE (BENADRYL) 25 MG tablet, Take 25 mg by mouth daily as needed for allergies. , Disp: , Rfl:  .  famotidine (PEPCID) 20 MG tablet, Take 20 mg by mouth daily., Disp: , Rfl:  .  Fluticasone Propionate (FLONASE NA), Place 1 spray into the nose as needed (allergies). For sinus infection, Disp: , Rfl:  .  gabapentin (NEURONTIN) 100 MG capsule, Take 1 capsule (100 mg total) by mouth at bedtime. (Patient taking differently: Take 100 mg by mouth at bedtime as needed. ), Disp: 30 capsule, Rfl: 1 .  losartan (COZAAR) 25 MG tablet, Take 25 mg by mouth daily., Disp: , Rfl:  .  metoprolol tartrate (LOPRESSOR) 25 MG tablet, TAKE 1 TABLET BY MOUTH TWICE A DAY, Disp: 180 tablet, Rfl: 1 .  oxyCODONE (OXY IR/ROXICODONE) 5 MG immediate release tablet, Take 1-2 tablets (5-10 mg total) by mouth every 4 (four) hours as needed for severe pain., Disp: 30 tablet, Rfl: 0 .  vitamin B-12 (CYANOCOBALAMIN) 1000 MCG tablet, Take 1,000  mcg by mouth daily., Disp: , Rfl:  .  zinc gluconate 50 MG tablet, Take 25 mg by mouth daily. , Disp: , Rfl:   Past Medical History: Past Medical History:  Diagnosis Date  . Arthritis    OA AND PAIN BOTH KNEES  . Asthma    AS A CHILD  . Chronic knee pain   . Coronary artery disease    a. PTCA/stenting ramus inter. vess.; 10/2008,  b. cardiac arrest 06/03/2019, CABG X3 LIMA-LAD, SVG-RI, SVG-Circ 06/08/2019  . GERD (gastroesophageal reflux disease)    MILD--WOULD TAKE ROLAID IF NEEDED  . Hernia    "AT MY BELLY BUTTON" - NO PAIN OR PROBLEMS  . Hyperlipidemia   . Hypertension   . Restless leg syndrome   . Seasonal allergies    FREQENT BRONCHITIS. SINUS INFECTIONS WITH SEASON CHANGES  . Shingles DEC 2012   MILD CASE-NO RESIDUAL PROBLEMS  . 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[employer] for ITP Cardiac and Pulmonary Rehab Latest Ref Rng & Units 06/08/2019 06/08/2019 06/08/2019 06/10/2019 06/12/2019   Cholestrol 0 - 200 mg/dL - - - - -   LDLCALC 0 - 99 mg/dL - - - - -  HDL >40 mg/dL - - - - -   Trlycerides <150 mg/dL - - - - -   Hemoglobin A1c 4.8 - 5.6 % - - - - -   PHART 7.350 - 7.450 7.386 - 7.339(L) - -   PCO2ART 32.0 - 48.0 mmHg 28.0(L) - 27.5(L) - -   HCO3 20.0 - 28.0 mmol/L 16.8(L) - 14.7(L) - -   TCO2 22 - 32 mmol/L 18(L) 17(L) 15(L) - -   ACIDBASEDEF 0.0 - 2.0 mmol/L 7.0(H) - 10.0(H) - -   O2SAT % 100.0 - 99.0 49.9 67.7      Capillary Blood Glucose: Lab Results  Component Value Date   GLUCAP 90 07/14/2019   GLUCAP 99 06/12/2019   GLUCAP 87 06/12/2019   GLUCAP 96 06/11/2019   GLUCAP 117 (H) 06/11/2019     Exercise Target Goals: Exercise Program Goal: Individual exercise prescription set using results from initial 6 min walk test and THRR while considering  patient's activity barriers and safety.   Exercise  Prescription Goal: Initial exercise prescription builds to 30-45 minutes a day of aerobic activity, 2-3 days per week.  Home exercise guidelines will be given to patient during program as part of exercise prescription that the participant will acknowledge.  Activity Barriers & Risk Stratification: Activity Barriers & Cardiac Risk Stratification - 07/27/19 1151      Activity Barriers & Cardiac Risk Stratification   Activity Barriers  None    Cardiac Risk Stratification  High       6 Minute Walk: 6 Minute Walk    Row Name 07/27/19 1149         6 Minute Walk   Phase  Initial     Distance  1515 feet     Walk Time  6 minutes     # of Rest Breaks  0     MPH  2.8     METS  3.4     RPE  11     VO2 Peak  12.19     Symptoms  No     Resting HR  86 bpm     Resting BP  138/64     Resting Oxygen Saturation   99 %     Exercise Oxygen Saturation  during 6 min walk  96 %     Max Ex. HR  114 bpm     Max Ex. BP  158/84     2 Minute Post BP  138/78        Oxygen Initial Assessment:   Oxygen Re-Evaluation:   Oxygen Discharge (Final Oxygen Re-Evaluation):   Initial Exercise Prescription: Initial Exercise Prescription - 07/27/19 1100      Date of Initial Exercise RX and Referring Provider   Date  07/27/19    Referring Provider  Dr. Cathie Olden    Expected Discharge Date  09/01/19      Treadmill   MPH  3    Grade  1    Minutes  15      NuStep   Level  3    SPM  85    Minutes  15    METs  3      Prescription Details   Frequency (times per week)  3    Duration  Progress to 30 minutes of continuous aerobic without signs/symptoms of physical distress      Intensity   THRR 40-80% of Max Heartrate  58-117    Ratings of Perceived Exertion  11-13  Progression   Progression  Continue to progress workloads to maintain intensity without signs/symptoms of physical distress.      Resistance Training   Training Prescription  Yes    Weight  4 lbs.     Reps  10-15        Perform Capillary Blood Glucose checks as needed.  Exercise Prescription Changes:   Exercise Comments:   Exercise Goals and Review: Exercise Goals    Row Name 07/27/19 1154             Exercise Goals   Increase Physical Activity  Yes       Intervention  Provide advice, education, support and counseling about physical activity/exercise needs.;Develop an individualized exercise prescription for aerobic and resistive training based on initial evaluation findings, risk stratification, comorbidities and participant's personal goals.       Expected Outcomes  Short Term: Attend rehab on a regular basis to increase amount of physical activity.;Long Term: Add in home exercise to make exercise part of routine and to increase amount of physical activity.;Long Term: Exercising regularly at least 3-5 days a week.       Increase Strength and Stamina  Yes       Intervention  Provide advice, education, support and counseling about physical activity/exercise needs.;Develop an individualized exercise prescription for aerobic and resistive training based on initial evaluation findings, risk stratification, comorbidities and participant's personal goals.       Expected Outcomes  Short Term: Increase workloads from initial exercise prescription for resistance, speed, and METs.;Short Term: Perform resistance training exercises routinely during rehab and add in resistance training at home;Long Term: Improve cardiorespiratory fitness, muscular endurance and strength as measured by increased METs and functional capacity (6MWT)       Able to understand and use rate of perceived exertion (RPE) scale  Yes       Intervention  Provide education and explanation on how to use RPE scale       Expected Outcomes  Short Term: Able to use RPE daily in rehab to express subjective intensity level;Long Term:  Able to use RPE to guide intensity level when exercising independently       Knowledge and understanding of Target  Heart Rate Range (THRR)  Yes       Intervention  Provide education and explanation of THRR including how the numbers were predicted and where they are located for reference       Expected Outcomes  Short Term: Able to state/look up THRR;Long Term: Able to use THRR to govern intensity when exercising independently;Short Term: Able to use daily as guideline for intensity in rehab       Able to check pulse independently  Yes       Intervention  Provide education and demonstration on how to check pulse in carotid and radial arteries.;Review the importance of being able to check your own pulse for safety during independent exercise       Expected Outcomes  Short Term: Able to explain why pulse checking is important during independent exercise;Long Term: Able to check pulse independently and accurately       Understanding of Exercise Prescription  Yes       Intervention  Provide education, explanation, and written materials on patient's individual exercise prescription       Expected Outcomes  Short Term: Able to explain program exercise prescription;Long Term: Able to explain home exercise prescription to exercise independently          Exercise  Goals Re-Evaluation :   Discharge Exercise Prescription (Final Exercise Prescription Changes):   Nutrition:  Target Goals: Understanding of nutrition guidelines, daily intake of sodium 1500mg , cholesterol 200mg , calories 30% from fat and 7% or less from saturated fats, daily to have 5 or more servings of fruits and vegetables.  Biometrics: Pre Biometrics - 07/27/19 1028      Pre Biometrics   Waist Circumference  40 inches    Hip Circumference  39 inches    Waist to Hip Ratio  1.03 %    Triceps Skinfold  30 mm    % Body Fat  30.3 %    Grip Strength  40 kg    Flexibility  14.5 in    Single Leg Stand  11.06 seconds        Nutrition Therapy Plan and Nutrition Goals:   Nutrition Assessments:   Nutrition Goals Re-Evaluation:   Nutrition  Goals Re-Evaluation:   Nutrition Goals Discharge (Final Nutrition Goals Re-Evaluation):   Psychosocial: Target Goals: Acknowledge presence or absence of significant depression and/or stress, maximize coping skills, provide positive support system. Participant is able to verbalize types and ability to use techniques and skills needed for reducing stress and depression.  Initial Review & Psychosocial Screening: Initial Psych Review & Screening - 07/27/19 1102      Initial Review   Current issues with  None Identified      Family Dynamics   Good Support System?  Yes   [name] has his wife for support     Barriers   Psychosocial barriers to participate in program  There are no identifiable barriers or psychosocial needs.      Screening Interventions   Interventions  Encouraged to exercise       Quality of Life Scores: Quality of Life - 07/27/19 1045      Quality of Life   Select  Quality of Life      Quality of Life Scores   Health/Function Pre  26.2 %    Socioeconomic Pre  28 %    Psych/Spiritual Pre  30 %    Family Pre  23.7 %    GLOBAL Pre  26.95 %      Scores of 19 and below usually indicate a poorer quality of life in these areas.  A difference of  2-3 points is a clinically meaningful difference.  A difference of 2-3 points in the total score of the Quality of Life Index has been associated with significant improvement in overall quality of life, self-image, physical symptoms, and general health in studies assessing change in quality of life.  PHQ-9: Recent Review Flowsheet Data    Depression screen [facility] 2/9 07/27/2019   Decreased Interest 0   Down, Depressed, Hopeless 0   PHQ - 2 Score 0     Interpretation of Total Score  Total Score Depression Severity:  1-4 = Minimal depression, 5-9 = Mild depression, 10-14 = Moderate depression, 15-19 = Moderately severe depression, 20-27 = Severe depression   Psychosocial Evaluation and Intervention:   Psychosocial  Re-Evaluation:   Psychosocial Discharge (Final Psychosocial Re-Evaluation):   Vocational Rehabilitation: Provide vocational rehab assistance to qualifying candidates.   Vocational Rehab Evaluation & Intervention: Vocational Rehab - 07/27/19 1059      Initial Vocational Rehab Evaluation & Intervention   Assessment shows need for Vocational Rehabilitation  No   [name] works part time and does not need vocational rehab at this time      Education: Education Goals: Education  classes will be provided on a weekly basis, covering required topics. Participant will state understanding/return demonstration of topics presented.  Learning Barriers/Preferences: Learning Barriers/Preferences - 07/27/19 1155      Learning Barriers/Preferences   Learning Barriers  None    Learning Preferences  Audio;Skilled Demonstration;Individual Instruction;Video;Verbal Instruction       Education Topics: Count Your Pulse:  -Group instruction provided by verbal instruction, demonstration, patient participation and written materials to support subject.  Instructors address importance of being able to find your pulse and how to count your pulse when at home without a heart monitor.  Patients get hands on experience counting their pulse with staff help and individually.   Heart Attack, Angina, and Risk Factor Modification:  -Group instruction provided by verbal instruction, video, and written materials to support subject.  Instructors address signs and symptoms of angina and heart attacks.    Also discuss risk factors for heart disease and how to make changes to improve heart health risk factors.   Functional Fitness:  -Group instruction provided by verbal instruction, demonstration, patient participation, and written materials to support subject.  Instructors address safety measures for doing things around the house.  Discuss how to get up and down off the floor, how to pick things up properly, how to safely  get out of a chair without assistance, and balance training.   Meditation and Mindfulness:  -Group instruction provided by verbal instruction, patient participation, and written materials to support subject.  Instructor addresses importance of mindfulness and meditation practice to help reduce stress and improve awareness.  Instructor also leads participants through a meditation exercise.    Stretching for Flexibility and Mobility:  -Group instruction provided by verbal instruction, patient participation, and written materials to support subject.  Instructors lead participants through series of stretches that are designed to increase flexibility thus improving mobility.  These stretches are additional exercise for major muscle groups that are typically performed during regular warm up and cool down.   Hands Only CPR:  -Group verbal, video, and participation provides a basic overview of AHA guidelines for community CPR. Role-play of emergencies allow participants the opportunity to practice calling for help and chest compression technique with discussion of AED use.   Hypertension: -Group verbal and written instruction that provides a basic overview of hypertension including the most recent diagnostic guidelines, risk factor reduction with self-care instructions and medication management.    Nutrition I class: Heart Healthy Eating:  -Group instruction provided by PowerPoint slides, verbal discussion, and written materials to support subject matter. The instructor gives an explanation and review of the Therapeutic Lifestyle Changes diet recommendations, which includes a discussion on lipid goals, dietary fat, sodium, fiber, plant stanol/sterol esters, sugar, and the components of a well-balanced, healthy diet.   Nutrition II class: Lifestyle Skills:  -Group instruction provided by PowerPoint slides, verbal discussion, and written materials to support subject matter. The instructor gives an  explanation and review of label reading, grocery shopping for heart health, heart healthy recipe modifications, and ways to make healthier choices when eating out.   Diabetes Question & Answer:  -Group instruction provided by PowerPoint slides, verbal discussion, and written materials to support subject matter. The instructor gives an explanation and review of diabetes co-morbidities, pre- and post-prandial blood glucose goals, pre-exercise blood glucose goals, signs, symptoms, and treatment of hypoglycemia and hyperglycemia, and foot care basics.   Diabetes Blitz:  -Group instruction provided by PowerPoint slides, verbal discussion, and written materials to support subject matter. The instructor  gives an explanation and review of the physiology behind type 1 and type 2 diabetes, diabetes medications and rational behind using different medications, pre- and post-prandial blood glucose recommendations and Hemoglobin A1c goals, diabetes diet, and exercise including blood glucose guidelines for exercising safely.    Portion Distortion:  -Group instruction provided by PowerPoint slides, verbal discussion, written materials, and food models to support subject matter. The instructor gives an explanation of serving size versus portion size, changes in portions sizes over the last 20 years, and what consists of a serving from each food group.   Stress Management:  -Group instruction provided by verbal instruction, video, and written materials to support subject matter.  Instructors review role of stress in heart disease and how to cope with stress positively.     Exercising on Your Own:  -Group instruction provided by verbal instruction, power point, and written materials to support subject.  Instructors discuss benefits of exercise, components of exercise, frequency and intensity of exercise, and end points for exercise.  Also discuss use of nitroglycerin and activating EMS.  Review options of places to  exercise outside of rehab.  Review guidelines for sex with heart disease.   Cardiac Drugs I:  -Group instruction provided by verbal instruction and written materials to support subject.  Instructor reviews cardiac drug classes: antiplatelets, anticoagulants, beta blockers, and statins.  Instructor discusses reasons, side effects, and lifestyle considerations for each drug class.   Cardiac Drugs II:  -Group instruction provided by verbal instruction and written materials to support subject.  Instructor reviews cardiac drug classes: angiotensin converting enzyme inhibitors (ACE-I), angiotensin II receptor blockers (ARBs), nitrates, and calcium channel blockers.  Instructor discusses reasons, side effects, and lifestyle considerations for each drug class.   Anatomy and Physiology of the Circulatory System:  Group verbal and written instruction and models provide basic cardiac anatomy and physiology, with the coronary electrical and arterial systems. Review of: AMI, Angina, Valve disease, Heart Failure, Peripheral Artery Disease, Cardiac Arrhythmia, Pacemakers, and the ICD.   Other Education:  -Group or individual verbal, written, or video instructions that support the educational goals of the cardiac rehab program.   Holiday Eating Survival Tips:  -Group instruction provided by PowerPoint slides, verbal discussion, and written materials to support subject matter. The instructor gives patients tips, tricks, and techniques to help them not only survive but enjoy the holidays despite the onslaught of food that accompanies the holidays.   Knowledge Questionnaire Score: Knowledge Questionnaire Score - 07/27/19 1046      Knowledge Questionnaire Score   Pre Score  18/24       Core Components/Risk Factors/Patient Goals at Admission: Personal Goals and Risk Factors at Admission - 07/27/19 1156      Core Components/Risk Factors/Patient Goals on Admission    Weight Management  Yes;Weight  Maintenance;Weight Loss    Admit Weight  204 lb 9.4 oz (92.8 kg)    Hypertension  Yes    Intervention  Provide education on lifestyle modifcations including regular physical activity/exercise, weight management, moderate sodium restriction and increased consumption of fresh fruit, vegetables, and low fat dairy, alcohol moderation, and smoking cessation.;Monitor prescription use compliance.    Expected Outcomes  Short Term: Continued assessment and intervention until BP is < 140/5mm HG in hypertensive participants. < 130/97mm HG in hypertensive participants with diabetes, heart failure or chronic kidney disease.;Long Term: Maintenance of blood pressure at goal levels.    Lipids  Yes    Intervention  Provide education and support for participant  on nutrition & aerobic/resistive exercise along with prescribed medications to achieve LDL 70mg , HDL >40mg .    Expected Outcomes  Long Term: Cholesterol controlled with medications as prescribed, with individualized exercise RX and with personalized nutrition plan. Value goals: LDL < 70mg , HDL > 40 mg.;Short Term: Participant states understanding of desired cholesterol values and is compliant with medications prescribed. Participant is following exercise prescription and nutrition guidelines.       Core Components/Risk Factors/Patient Goals Review:    Core Components/Risk Factors/Patient Goals at Discharge (Final Review):    ITP Comments: ITP Comments    Row Name 07/27/19 1027           ITP Comments  Dr. Fransico Him, Medical Director          Comments:Keith attended orientation on 07/27/2019 to review rules and guidelines for program.  Completed 6 minute walk test, Intitial ITP, and exercise prescription.  VSS. 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  Asymptomatic. Safety measures and social distancing in place per CDC guidelines.Barnet Pall, RN,BSN 07/27/2019 12:25  PM

## 2019-07-28 ENCOUNTER — Ambulatory Visit
Admission: RE | Admit: 2019-07-28 | Discharge: 2019-07-28 | Disposition: A | Discharge status: Home / Self Care | Payer: PPO | Source: Ambulatory Visit | Attending: Internal Medicine | Admitting: Internal Medicine

## 2019-07-28 DIAGNOSIS — N281 Cyst of kidney, acquired: Secondary | ICD-10-CM

## 2019-07-31 ENCOUNTER — Encounter (HOSPITAL_COMMUNITY)
Admission: RE | Admit: 2019-07-31 | Discharge: 2019-07-31 | Disposition: A | Discharge status: Home / Self Care | Payer: PPO | Source: Ambulatory Visit | Attending: Cardiovascular Disease | Admitting: Cardiovascular Disease

## 2019-07-31 ENCOUNTER — Other Ambulatory Visit: Payer: Self-pay

## 2019-07-31 DIAGNOSIS — Z951 Presence of aortocoronary bypass graft: Secondary | ICD-10-CM | POA: Diagnosis not present

## 2019-07-31 DIAGNOSIS — N281 Cyst of kidney, acquired: Secondary | ICD-10-CM | POA: Diagnosis not present

## 2019-07-31 DIAGNOSIS — I214 Non-ST elevation (NSTEMI) myocardial infarction: Secondary | ICD-10-CM | POA: Diagnosis not present

## 2019-07-31 NOTE — Progress Notes (Signed)
Daily Session Note  Patient Details  Name: [name] MRN: [id number] Date of Birth: 1946/11/01 Referring Provider:     CARDIAC REHAB PHASE II ORIENTATION from 07/27/2019 in [place]  Referring Provider  Dr. Cathie Olden      Encounter Date: 07/31/2019  Check In: Session Check In - 07/31/19 0921      Check-In   Supervising physician immediately available to respond to emergencies  [facility] immediately available    Physician(s)  Dr. Benny Lennert    Location  [facility]    Staff Present  Barnet Pall, RN, Toma Deiters, RN, BSN;Brittany Durene Fruits, BS, ACSM CEP, Exercise Physiologist;Dalton Kris Mouton, MS, Exercise Physiologist;Olinty Celesta Aver, MS, ACSM CEP, Exercise Physiologist    Virtual Visit  No    Medication changes reported      No    Fall or balance concerns reported     No    Tobacco Cessation  No Change    Warm-up and Cool-down  Performed on first and last piece of equipment    Resistance Training Performed  Yes    VAD Patient?  No    PAD/SET Patient?  No      Pain Assessment   Currently in Pain?  No/denies    Multiple Pain Sites  No       Capillary Blood Glucose: No results found for this or any previous visit (from the past 24 hour(s)).  Exercise Prescription Changes - 07/31/19 1000      Response to Exercise   Blood Pressure (Admit)  112/74    Blood Pressure (Exercise)  158/82    Blood Pressure (Exit)  112/68    Heart Rate (Admit)  81 bpm    Heart Rate (Exercise)  136 bpm    Heart Rate (Exit)  85 bpm    Rating of Perceived Exertion (Exercise)  13    Perceived Dyspnea (Exercise)  0    Symptoms  none    Comments  Tolerated exe well, decreased incline on TM due to elevated HR.    Duration  Continue with 30 min of aerobic exercise without signs/symptoms of physical distress.    Intensity  THRR unchanged      Progression   Progression  Continue to progress workloads to maintain intensity without  signs/symptoms of physical distress.    Average METs  3.2      Resistance Training   Training Prescription  Yes    Weight  3lbs    Reps  10-15    Time  10 Minutes      Interval Training   Interval Training  No      Treadmill   MPH  3    Grade  1    Minutes  15    METs  3.71      NuStep   Level  3    SPM  85    Minutes  15    METs  2.6       Social History   Tobacco Use  Smoking Status Never Smoker  Smokeless Tobacco Former Systems developer    Goals Met:  Exercise tolerated well No report of cardiac concerns or symptoms  Goals Unmet:  Not Applicable  Comments: Pt started cardiac rehab today.  Pt tolerated light exercise without difficulty. VSS, telemetry-Sinus Rhythm, asymptomatic.  Medication list reconciled. Pt denies barriers to medicaiton compliance.  PSYCHOSOCIAL ASSESSMENT:  PHQ-0. Pt exhibits positive coping skills, hopeful outlook with supportive family. No psychosocial needs  identified at this time, no psychosocial interventions necessary.    Pt enjoys hunting fishing and sports.   Pt oriented to exercise equipment and routine.    Understanding verbalized.Barnet Pall, RN,BSN 07/31/2019 1:57 PM   Dr. Fransico Him is Medical Director for Cardiac Rehab at [facility].

## 2019-08-02 ENCOUNTER — Encounter (HOSPITAL_COMMUNITY)
Admission: RE | Admit: 2019-08-02 | Discharge: 2019-08-02 | Disposition: A | Discharge status: Home / Self Care | Payer: PPO | Source: Ambulatory Visit | Attending: Cardiovascular Disease | Admitting: Cardiovascular Disease

## 2019-08-02 ENCOUNTER — Other Ambulatory Visit: Payer: Self-pay

## 2019-08-02 DIAGNOSIS — I214 Non-ST elevation (NSTEMI) myocardial infarction: Secondary | ICD-10-CM | POA: Diagnosis not present

## 2019-08-02 DIAGNOSIS — Z951 Presence of aortocoronary bypass graft: Secondary | ICD-10-CM

## 2019-08-04 ENCOUNTER — Encounter (HOSPITAL_COMMUNITY)
Admission: RE | Admit: 2019-08-04 | Discharge: 2019-08-04 | Disposition: A | Discharge status: Home / Self Care | Payer: PPO | Source: Ambulatory Visit | Attending: Cardiovascular Disease | Admitting: Cardiovascular Disease

## 2019-08-04 ENCOUNTER — Other Ambulatory Visit: Payer: Self-pay

## 2019-08-04 DIAGNOSIS — Z951 Presence of aortocoronary bypass graft: Secondary | ICD-10-CM

## 2019-08-04 DIAGNOSIS — I214 Non-ST elevation (NSTEMI) myocardial infarction: Secondary | ICD-10-CM

## 2019-08-07 ENCOUNTER — Encounter (HOSPITAL_COMMUNITY)
Admission: RE | Admit: 2019-08-07 | Discharge: 2019-08-07 | Disposition: A | Discharge status: Home / Self Care | Payer: PPO | Source: Ambulatory Visit | Attending: Cardiovascular Disease | Admitting: Cardiovascular Disease

## 2019-08-07 ENCOUNTER — Other Ambulatory Visit: Payer: Self-pay

## 2019-08-07 ENCOUNTER — Other Ambulatory Visit: Payer: PPO

## 2019-08-07 DIAGNOSIS — E785 Hyperlipidemia, unspecified: Secondary | ICD-10-CM | POA: Diagnosis not present

## 2019-08-07 DIAGNOSIS — I2510 Atherosclerotic heart disease of native coronary artery without angina pectoris: Secondary | ICD-10-CM | POA: Diagnosis not present

## 2019-08-07 DIAGNOSIS — Z951 Presence of aortocoronary bypass graft: Secondary | ICD-10-CM

## 2019-08-07 DIAGNOSIS — I214 Non-ST elevation (NSTEMI) myocardial infarction: Secondary | ICD-10-CM | POA: Diagnosis not present

## 2019-08-07 LAB — LIPID PANEL
Chol/HDL Ratio: 2.4 ratio (ref 0.0–5.0)
Cholesterol, Total: 144 mg/dL (ref 100–199)
HDL: 59 mg/dL (ref >39)
LDL Chol Calc (NIH): 65 mg/dL (ref 0–99)
Triglycerides: 114 mg/dL (ref 0–149)
VLDL Cholesterol Cal: 20 mg/dL (ref 5–40)

## 2019-08-07 LAB — HEPATIC FUNCTION PANEL
ALT: 21 IU/L (ref 0–44)
AST: 20 IU/L (ref 0–40)
Albumin: 4.2 g/dL (ref 3.7–4.7)
Alkaline Phosphatase: 81 IU/L (ref 39–117)
Bilirubin Total: 0.7 mg/dL (ref 0.0–1.2)
Bilirubin, Direct: 0.19 mg/dL (ref 0.00–0.40)
Total Protein: 6.3 g/dL (ref 6.0–8.5)

## 2019-08-09 ENCOUNTER — Encounter (HOSPITAL_COMMUNITY)
Admission: RE | Admit: 2019-08-09 | Discharge: 2019-08-09 | Disposition: A | Discharge status: Home / Self Care | Payer: PPO | Source: Ambulatory Visit | Attending: Cardiovascular Disease | Admitting: Cardiovascular Disease

## 2019-08-09 ENCOUNTER — Other Ambulatory Visit: Payer: Self-pay

## 2019-08-09 DIAGNOSIS — Z951 Presence of aortocoronary bypass graft: Secondary | ICD-10-CM | POA: Diagnosis not present

## 2019-08-09 DIAGNOSIS — I214 Non-ST elevation (NSTEMI) myocardial infarction: Secondary | ICD-10-CM | POA: Insufficient documentation

## 2019-08-09 NOTE — Progress Notes (Signed)
I have reviewed a Home Exercise Prescription with [name] . [name] is currently exercising at home.  The patient was advised to walk 5-7 days a week for 30-45 minutes.  [name] and I discussed how to progress their exercise prescription.  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  The patient stated that they understand the exercise prescription.  We reviewed exercise guidelines, target heart rate during exercise, RPE Scale, weather conditions, NTG use, endpoints for exercise, warmup and cool down.  Patient is encouraged to come to me with any questions. I will continue to follow up with the patient to assist them with progression and safety.   [name] BS, ACSM CEP   08/09/2019 10:39 AM

## 2019-08-11 ENCOUNTER — Other Ambulatory Visit: Payer: Self-pay

## 2019-08-11 ENCOUNTER — Encounter (HOSPITAL_COMMUNITY)
Admission: RE | Admit: 2019-08-11 | Discharge: 2019-08-11 | Disposition: A | Discharge status: Home / Self Care | Payer: PPO | Source: Ambulatory Visit | Attending: Cardiovascular Disease | Admitting: Cardiovascular Disease

## 2019-08-11 DIAGNOSIS — I214 Non-ST elevation (NSTEMI) myocardial infarction: Secondary | ICD-10-CM | POA: Diagnosis not present

## 2019-08-11 DIAGNOSIS — Z951 Presence of aortocoronary bypass graft: Secondary | ICD-10-CM

## 2019-08-16 ENCOUNTER — Other Ambulatory Visit: Payer: Self-pay

## 2019-08-16 ENCOUNTER — Encounter (HOSPITAL_COMMUNITY)
Admission: RE | Admit: 2019-08-16 | Discharge: 2019-08-16 | Disposition: A | Discharge status: Home / Self Care | Payer: PPO | Source: Ambulatory Visit | Attending: Cardiovascular Disease | Admitting: Cardiovascular Disease

## 2019-08-16 DIAGNOSIS — I214 Non-ST elevation (NSTEMI) myocardial infarction: Secondary | ICD-10-CM | POA: Diagnosis not present

## 2019-08-16 DIAGNOSIS — Z951 Presence of aortocoronary bypass graft: Secondary | ICD-10-CM

## 2019-08-18 ENCOUNTER — Other Ambulatory Visit: Payer: Self-pay

## 2019-08-18 ENCOUNTER — Encounter (HOSPITAL_COMMUNITY)
Admission: RE | Admit: 2019-08-18 | Discharge: 2019-08-18 | Disposition: A | Discharge status: Home / Self Care | Payer: PPO | Source: Ambulatory Visit | Attending: Cardiovascular Disease | Admitting: Cardiovascular Disease

## 2019-08-18 DIAGNOSIS — Z951 Presence of aortocoronary bypass graft: Secondary | ICD-10-CM

## 2019-08-18 DIAGNOSIS — I214 Non-ST elevation (NSTEMI) myocardial infarction: Secondary | ICD-10-CM | POA: Diagnosis not present

## 2019-08-21 ENCOUNTER — Encounter (HOSPITAL_COMMUNITY)
Admission: RE | Admit: 2019-08-21 | Discharge: 2019-08-21 | Disposition: A | Discharge status: Home / Self Care | Payer: PPO | Source: Ambulatory Visit | Attending: Cardiovascular Disease | Admitting: Cardiovascular Disease

## 2019-08-21 ENCOUNTER — Other Ambulatory Visit: Payer: Self-pay

## 2019-08-21 DIAGNOSIS — I214 Non-ST elevation (NSTEMI) myocardial infarction: Secondary | ICD-10-CM

## 2019-08-21 DIAGNOSIS — Z951 Presence of aortocoronary bypass graft: Secondary | ICD-10-CM

## 2019-08-23 ENCOUNTER — Encounter: Payer: Self-pay | Admitting: Cardiothoracic Surgery

## 2019-08-23 ENCOUNTER — Other Ambulatory Visit: Payer: Self-pay

## 2019-08-23 ENCOUNTER — Ambulatory Visit (INDEPENDENT_AMBULATORY_CARE_PROVIDER_SITE_OTHER): Payer: Self-pay | Admitting: Cardiothoracic Surgery

## 2019-08-23 ENCOUNTER — Encounter (HOSPITAL_COMMUNITY)
Admission: RE | Admit: 2019-08-23 | Discharge: 2019-08-23 | Disposition: A | Discharge status: Home / Self Care | Payer: PPO | Source: Ambulatory Visit | Attending: Cardiovascular Disease | Admitting: Cardiovascular Disease

## 2019-08-23 VITALS — BP 147/86 | HR 70 | Temp 97.7°F | Resp 16 | Ht 74.5 in | Wt 205.2 lb

## 2019-08-23 DIAGNOSIS — Z951 Presence of aortocoronary bypass graft: Secondary | ICD-10-CM

## 2019-08-23 DIAGNOSIS — I2510 Atherosclerotic heart disease of native coronary artery without angina pectoris: Secondary | ICD-10-CM

## 2019-08-23 DIAGNOSIS — I214 Non-ST elevation (NSTEMI) myocardial infarction: Secondary | ICD-10-CM | POA: Diagnosis not present

## 2019-08-23 NOTE — Progress Notes (Signed)
PCP is Lorene Dy, MD Referring Provider is Lorene Dy, MD  Chief Complaint  Patient presents with  . Routine Post Op    6 wk f/u s/p CABG X 3 06/16/19...added Gabapentin last visIT FOR RESTLESS LEG    HPI: Final 75-month follow-up after urgent CABG after the patient presented with V. fib arrest and three-vessel CAD.  He is now doing well and almost completed his phase 2 cardiac rehab.  His strength and exercise tolerance have significant improved.  His incisions are healed.  He has no postop chest pain presyncope edema or shortness of breath.  He is considering returning to a part-time job next month which would be fine for low intensity activities. He is followed carefully by his cardiologist Dr. Acie Fredrickson and a recent lipid profile demonstrated excellent lipid control  Past Medical History:  Diagnosis Date  . Arthritis    OA AND PAIN BOTH KNEES  . Asthma    AS A CHILD  . Chronic knee pain   . Coronary artery disease    a. PTCA/stenting ramus inter. vess.; 10/2008,  b. cardiac arrest 06/03/2019, CABG X3 LIMA-LAD, SVG-RI, SVG-Circ 06/08/2019  . GERD (gastroesophageal reflux disease)    MILD--WOULD TAKE ROLAID IF NEEDED  . Hernia    "AT MY BELLY BUTTON" - NO PAIN OR PROBLEMS  . Hyperlipidemia   . Hypertension   . Restless leg syndrome   . Seasonal allergies    FREQENT BRONCHITIS. SINUS INFECTIONS WITH SEASON CHANGES  . Shingles DEC 2012   MILD CASE-NO RESIDUAL PROBLEMS  . Sleep apnea 07/06/12   STOP BANG SCORE OF 4    Past Surgical History:  Procedure Laterality Date  . CARDIAC CATHETERIZATION     10/30/2008;  PTCA/stent ramus interm. vess.  . CORONARY ANGIOPLASTY    . CORONARY ARTERY BYPASS GRAFT N/A 06/08/2019   Procedure: CORONARY ARTERY BYPASS GRAFTING (CABG), ON PUMP, TIMES THREE, USING LEFT INTERNAL MAMMARY ARTERY AND ENDOSCOPICALLY HARVESTED LEFT GREATER SAPHENOUS VEIN;  Surgeon: Ivin Poot, MD;  Location: [place];  Service: Open Heart Surgery;  Laterality: N/A;   . CORONARY/GRAFT ACUTE MI REVASCULARIZATION N/A 06/04/2019   Procedure: Coronary/Graft Acute MI Revascularization;  Surgeon: Burnell Blanks, MD;  Location: [place] [facility];  Service: Cardiovascular;  Laterality: N/A;  . JOINT REPLACEMENT    . KNEE SURGERY     right and left  . LEFT HEART CATH AND CORONARY ANGIOGRAPHY N/A 06/04/2019   Procedure: LEFT HEART CATH AND CORONARY ANGIOGRAPHY;  Surgeon: Burnell Blanks, MD;  Location: [place] [facility];  Service: Cardiovascular;  Laterality: N/A;  . TEE WITHOUT CARDIOVERSION N/A 06/08/2019   Procedure: TRANSESOPHAGEAL ECHOCARDIOGRAM (TEE);  Surgeon: Prescott Gum, Collier Salina, MD;  Location: [place];  Service: Open Heart Surgery;  Laterality: N/A;  . TOTAL KNEE ARTHROPLASTY  07/26/2012   Procedure: TOTAL KNEE ARTHROPLASTY;  Surgeon: Mauri Pole, MD;  Location: [facility];  Service: Orthopedics;  Laterality: Left;  . TOTAL KNEE ARTHROPLASTY  10/25/2012   Procedure: TOTAL KNEE ARTHROPLASTY;  Surgeon: Mauri Pole, MD;  Location: [facility];  Service: Orthopedics;  Laterality: Right;    Family History  Problem Relation Age of Onset  . Hypertension Mother        died at age 63  . COPD Father   . CAD Father        Died of MI at age 57    Social History Social History   Tobacco Use  . Smoking status: Never Smoker  .  Smokeless tobacco: Former Network engineer Use Topics  . Alcohol use: Yes    Comment: occ  . Drug use: No    Current Outpatient Medications  Medication Sig Dispense Refill  . aspirin EC 81 MG tablet Take 81 mg by mouth every morning.    . B COMPLEX-C-E PO Take 1 tablet by mouth daily as needed.     . clopidogrel (PLAVIX) 75 MG tablet Take 1 tablet (75 mg total) by mouth every morning. 90 tablet 3  . diphenhydrAMINE (BENADRYL) 25 MG tablet Take 25 mg by mouth daily as needed for allergies.     . famotidine (PEPCID) 20 MG tablet Take 20 mg by mouth daily.    . Fluticasone Propionate (FLONASE NA) Place 1 spray into the nose as  needed (allergies). For sinus infection    . gabapentin (NEURONTIN) 100 MG capsule Take 1 capsule (100 mg total) by mouth at bedtime. (Patient taking differently: Take 100 mg by mouth at bedtime as needed. ) 30 capsule 1  . losartan (COZAAR) 25 MG tablet Take 25 mg by mouth daily.    . metoprolol tartrate (LOPRESSOR) 25 MG tablet TAKE 1 TABLET BY MOUTH TWICE A DAY 180 tablet 1  . oxyCODONE (OXY IR/ROXICODONE) 5 MG immediate release tablet Take 1-2 tablets (5-10 mg total) by mouth every 4 (four) hours as needed for severe pain. 30 tablet 0  . vitamin B-12 (CYANOCOBALAMIN) 1000 MCG tablet Take 1,000 mcg by mouth daily.    Marland Kitchen zinc gluconate 50 MG tablet Take 25 mg by mouth daily.      No current facility-administered medications for this visit.     Allergies  Allergen Reactions  . Clarithromycin Other (See Comments)    Reaction=dizziness    Review of Systems  Data from cardiac rehab personally reviewed with patient.  BP (!) 147/86 (BP Location: Right Arm, Patient Position: Sitting, Cuff Size: Normal)   Pulse 70   Temp 97.7 F (36.5 C)   Resp 16   Ht 6' 2.5" (1.892 m)   Wt 205 lb 4 oz (93.1 kg)   SpO2 95% Comment: RA  BMI 26.00 kg/m  Physical Exam      Exam    General- alert and comfortable    Neck- no JVD, no cervical adenopathy palpable, no carotid bruit   Lungs- clear without rales, wheezes   Cor- regular rate and rhythm, no murmur , gallop   Abdomen- soft, non-tender   Extremities - warm, non-tender, minimal edema   Neuro- oriented, appropriate, no focal weakness   Diagnostic Tests: No chest x-ray today  Impression: Excellent postop recovery Ready to resume normal activities without limitations after October 2 He has demonstrated understanding of heart healthy lifestyle Plan: Continue current meds and return as needed.   Len Childs, MD [facility] [phone]

## 2019-08-24 NOTE — Progress Notes (Signed)
Cardiac Individual Treatment Plan  Patient Details  Name: [name] MRN: [id number] Date of Birth: 12-15-1945 Referring Provider:     CARDIAC REHAB PHASE II ORIENTATION from 07/27/2019 in [facility]  Referring Provider  Dr. 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  aspirin EC 81 MG tablet, Take 81 mg by mouth every morning., Disp: , Rfl:  .  B COMPLEX-C-E PO, Take 1 tablet by mouth daily as needed. , Disp: , Rfl:  .  clopidogrel (PLAVIX) 75 MG tablet, Take 1 tablet (75 mg total) by mouth every morning., Disp: 90 tablet, Rfl: 3 .  diphenhydrAMINE (BENADRYL) 25 MG tablet, Take 25 mg by mouth daily as needed for allergies. , Disp: , Rfl:  .  famotidine (PEPCID) 20 MG tablet, Take 20 mg by mouth daily., Disp: , Rfl:  .  Fluticasone Propionate (FLONASE NA), Place 1 spray into the nose as needed (allergies). For sinus infection, Disp: , Rfl:  .  gabapentin (NEURONTIN) 100 MG capsule, Take 1 capsule (100 mg total) by mouth at bedtime. (Patient taking differently: Take 100 mg by mouth at bedtime as needed. ), Disp: 30 capsule, Rfl: 1 .  losartan (COZAAR) 25 MG tablet, Take 25 mg by mouth daily., Disp: , Rfl:  .  metoprolol tartrate (LOPRESSOR) 25 MG tablet, TAKE 1 TABLET BY MOUTH TWICE A DAY, Disp: 180 tablet, Rfl: 1 .  oxyCODONE (OXY IR/ROXICODONE) 5 MG immediate release tablet, Take 1-2 tablets (5-10 mg total) by mouth every 4 (four) hours as needed for severe pain., Disp: 30 tablet, Rfl: 0 .  vitamin B-12 (CYANOCOBALAMIN) 1000 MCG tablet, Take 1,000 mcg by mouth daily., Disp: , Rfl:  .  zinc gluconate 50 MG tablet, Take 25 mg by mouth daily. , Disp: , Rfl:   Past  Medical History: Past Medical History:  Diagnosis Date  . Arthritis    OA AND PAIN BOTH KNEES  . Asthma    AS A CHILD  . Chronic knee pain   . Coronary artery disease    a. PTCA/stenting ramus inter. vess.; 10/2008,  b. cardiac arrest 06/03/2019, CABG X3 LIMA-LAD, SVG-RI, SVG-Circ 06/08/2019  . GERD (gastroesophageal reflux disease)    MILD--WOULD TAKE ROLAID IF NEEDED  . Hernia    "AT MY BELLY BUTTON" - NO PAIN OR PROBLEMS  . Hyperlipidemia   . Hypertension   . Restless leg syndrome   . Seasonal allergies    FREQENT BRONCHITIS. SINUS INFECTIONS WITH SEASON CHANGES  . Shingles DEC 2012   MILD CASE-NO RESIDUAL PROBLEMS  . 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[employer] officer for ITP Cardiac and Pulmonary Rehab Latest Ref Rng & Units 06/08/2019 06/08/2019 06/10/2019 06/12/2019 08/07/2019   Cholestrol 100 - 199 mg/dL - - - - 144   LDLCALC 0 - 99 mg/dL - - - - -   HDL >39 mg/dL - - - - 59   Trlycerides 0 - 149 mg/dL - - - -  114   Hemoglobin A1c 4.8 - 5.6 % - - - - -   PHART 7.350 - 7.450 - 7.339(L) - - -   PCO2ART 32.0 - 48.0 mmHg - 27.5(L) - - -   HCO3 20.0 - 28.0 mmol/L - 14.7(L) - - -   TCO2 22 - 32 mmol/L 17(L) 15(L) - - -   ACIDBASEDEF 0.0 - 2.0 mmol/L - 10.0(H) - - -   O2SAT % - 99.0 49.9 67.7 -      Capillary Blood Glucose: Lab Results  Component Value Date   GLUCAP 90 07/14/2019   GLUCAP 99 06/12/2019   GLUCAP 87 06/12/2019   GLUCAP 96 06/11/2019   GLUCAP 117 (H) 06/11/2019     Exercise Target Goals: Exercise Program Goal: Individual exercise prescription set using results from initial 6 min walk test and THRR while considering  patient's activity barriers and safety.   Exercise Prescription Goal: Starting with aerobic activity 30 plus minutes a day, 3 days per week for initial exercise prescription. Provide home exercise  prescription and guidelines that participant acknowledges understanding prior to discharge.  Activity Barriers & Risk Stratification: Activity Barriers & Cardiac Risk Stratification - 07/27/19 1151      Activity Barriers & Cardiac Risk Stratification   Activity Barriers  None    Cardiac Risk Stratification  High       6 Minute Walk: 6 Minute Walk    Row Name 07/27/19 1149         6 Minute Walk   Phase  Initial     Distance  1515 feet     Walk Time  6 minutes     # of Rest Breaks  0     MPH  2.8     METS  3.4     RPE  11     VO2 Peak  12.19     Symptoms  No     Resting HR  86 bpm     Resting BP  138/64     Resting Oxygen Saturation   99 %     Exercise Oxygen Saturation  during 6 min walk  96 %     Max Ex. HR  114 bpm     Max Ex. BP  158/84     2 Minute Post BP  138/78        Oxygen Initial Assessment:   Oxygen Re-Evaluation:   Oxygen Discharge (Final Oxygen Re-Evaluation):   Initial Exercise Prescription: Initial Exercise Prescription - 07/27/19 1100      Date of Initial Exercise RX and Referring Provider   Date  07/27/19    Referring Provider  Dr. Cathie Olden    Expected Discharge Date  09/01/19      Treadmill   MPH  3    Grade  1    Minutes  15      NuStep   Level  3    SPM  85    Minutes  15    METs  3      Prescription Details   Frequency (times per week)  3    Duration  Progress to 30 minutes of continuous aerobic without signs/symptoms of physical distress      Intensity   THRR 40-80% of Max Heartrate  58-117    Ratings of Perceived Exertion  11-13      Progression   Progression  Continue to progress workloads to maintain intensity without signs/symptoms of physical distress.  Resistance Training   Training Prescription  Yes    Weight  4 lbs.     Reps  10-15       Perform Capillary Blood Glucose checks as needed.  Exercise Prescription Changes: Exercise Prescription Changes    Row Name 07/31/19 1000 08/09/19 1000 08/23/19 0915          Response to Exercise   Blood Pressure (Admit)  112/74  118/78  118/64     Blood Pressure (Exercise)  158/82  128/70  118/68     Blood Pressure (Exit)  112/68  120/72  104/68     Heart Rate (Admit)  81 bpm  72 bpm  74 bpm     Heart Rate (Exercise)  136 bpm  119 bpm  121 bpm     Heart Rate (Exit)  85 bpm  78 bpm  81 bpm     Rating of Perceived Exertion (Exercise)  _0 Perceived Dyspnea (Exercise)  0  0  0     Symptoms  none  none  none     Comments  Tolerated exe well, decreased incline on TM due to elevated HR.  Tolerated exe well, decreased incline on TM due to elevated HR.  -     Duration  Continue with 30 min of aerobic exercise without signs/symptoms of physical distress.  Continue with 30 min of aerobic exercise without signs/symptoms of physical distress.  Continue with 30 min of aerobic exercise without signs/symptoms of physical distress.     Intensity  THRR unchanged  THRR unchanged  THRR unchanged       Progression   Progression  Continue to progress workloads to maintain intensity without signs/symptoms of physical distress.  Continue to progress workloads to maintain intensity without signs/symptoms of physical distress.  Continue to progress workloads to maintain intensity without signs/symptoms of physical distress.     Average METs  3.2  3.2  3.3       Resistance Training   Training Prescription  Yes  No  No     Weight  3lbs  -  -     Reps  10-15  -  -     Time  10 Minutes  -  -       Interval Training   Interval Training  No  No  -       Treadmill   MPH  3  2.8  3     Grade  _1 Minutes  _2 METs  3.71  3.22  3.71       Recumbant Bike   Level  -  2.2  3     Watts  -  -  38     Minutes  -  15  15     METs  -  2.9  3.3       NuStep   Level  3  -  -     SPM  85  -  -     Minutes  15  -  -     METs  2.6  -  -       Home Exercise Plan   Plans to continue exercise at  -  Home (comment)  Home (comment)     Frequency  -   Add 3 additional days to program exercise sessions.  Add  3 additional days to program exercise sessions.     Initial Home Exercises Provided  -  08/09/19  08/09/19        Exercise Comments: Exercise Comments    Row Name 07/31/19 1018 08/09/19 1042 08/21/19 1150       Exercise Comments  Patient tolerated low-moderate intesnity exercise well without c/o,  Reviewed HEP with Pt. Pt understands goals.  Reviewed METs and goals with Pt. Pt understands goals.        Exercise Goals and Review: Exercise Goals    Row Name 07/27/19 1154             Exercise Goals   Increase Physical Activity  Yes       Intervention  Provide advice, education, support and counseling about physical activity/exercise needs.;Develop an individualized exercise prescription for aerobic and resistive training based on initial evaluation findings, risk stratification, comorbidities and participant's personal goals.       Expected Outcomes  Short Term: Attend rehab on a regular basis to increase amount of physical activity.;Long Term: Add in home exercise to make exercise part of routine and to increase amount of physical activity.;Long Term: Exercising regularly at least 3-5 days a week.       Increase Strength and Stamina  Yes       Intervention  Provide advice, education, support and counseling about physical activity/exercise needs.;Develop an individualized exercise prescription for aerobic and resistive training based on initial evaluation findings, risk stratification, comorbidities and participant's personal goals.       Expected Outcomes  Short Term: Increase workloads from initial exercise prescription for resistance, speed, and METs.;Short Term: Perform resistance training exercises routinely during rehab and add in resistance training at home;Long Term: Improve cardiorespiratory fitness, muscular endurance and strength as measured by increased METs and functional capacity (6MWT)       Able to understand and use  rate of perceived exertion (RPE) scale  Yes       Intervention  Provide education and explanation on how to use RPE scale       Expected Outcomes  Short Term: Able to use RPE daily in rehab to express subjective intensity level;Long Term:  Able to use RPE to guide intensity level when exercising independently       Knowledge and understanding of Target Heart Rate Range (THRR)  Yes       Intervention  Provide education and explanation of THRR including how the numbers were predicted and where they are located for reference       Expected Outcomes  Short Term: Able to state/look up THRR;Long Term: Able to use THRR to govern intensity when exercising independently;Short Term: Able to use daily as guideline for intensity in rehab       Able to check pulse independently  Yes       Intervention  Provide education and demonstration on how to check pulse in carotid and radial arteries.;Review the importance of being able to check your own pulse for safety during independent exercise       Expected Outcomes  Short Term: Able to explain why pulse checking is important during independent exercise;Long Term: Able to check pulse independently and accurately       Understanding of Exercise Prescription  Yes       Intervention  Provide education, explanation, and written materials on patient's individual exercise prescription       Expected Outcomes  Short Term: Able to explain program exercise prescription;Long Term: Able to  explain home exercise prescription to exercise independently          Exercise Goals Re-Evaluation : Exercise Goals Re-Evaluation    Row Name 07/31/19 1016 08/09/19 1040 08/21/19 1148         Exercise Goal Re-Evaluation   Exercise Goals Review  Able to understand and use rate of perceived exertion (RPE) scale;Increase Physical Activity  Increase Physical Activity;Increase Strength and Stamina;Able to understand and use rate of perceived exertion (RPE) scale;Knowledge and understanding of  Target Heart Rate Range (THRR);Able to check pulse independently;Understanding of Exercise Prescription  Increase Physical Activity;Increase Strength and Stamina;Able to understand and use rate of perceived exertion (RPE) scale;Knowledge and understanding of Target Heart Rate Range (THRR);Able to check pulse independently;Understanding of Exercise Prescription     Comments  Patient tolerated 1st exercise session well, decreased incline on TM due to elevated HR. Pt able to understand and use RPE scale appropriately.  Reviewed HEP with Pt. Pt understands goals, exercise Rx, RPE scale, THRR, warm up and cool down exercises, temperature precautions, NTG use, and end points of exercise. Pt is currently exercising at a local fire deptartment by using the treadmill, stationary bike, and weights 2-4 days per week for 30-45 minutes in addition to CR program.  Reviewed METs and goals with Pt. Pt average MET level is 3.1 Pt is progressing well and increasing workloads gradually. Pt is currently exercising 30-45 minutes a day by using a treadmill, bike, and weights in addition to CR program.     Expected Outcomes  Progress workloads as tolerated to help increase cardiorespiratory fitness.  Will continue to monitor and progress Pt as tolerated.  Will continue to monitor and progress Pt as tolerated         Discharge Exercise Prescription (Final Exercise Prescription Changes): Exercise Prescription Changes - 08/23/19 0915      Response to Exercise   Blood Pressure (Admit)  118/64    Blood Pressure (Exercise)  118/68    Blood Pressure (Exit)  104/68    Heart Rate (Admit)  74 bpm    Heart Rate (Exercise)  121 bpm    Heart Rate (Exit)  81 bpm    Rating of Perceived Exertion (Exercise)  12    Perceived Dyspnea (Exercise)  0    Symptoms  none    Duration  Continue with 30 min of aerobic exercise without signs/symptoms of physical distress.    Intensity  THRR unchanged      Progression   Progression  Continue to  progress workloads to maintain intensity without signs/symptoms of physical distress.    Average METs  3.3      Resistance Training   Training Prescription  No      Treadmill   MPH  3    Grade  1    Minutes  15    METs  3.71      Recumbant Bike   Level  3    Watts  38    Minutes  15    METs  3.3      Home Exercise Plan   Plans to continue exercise at  Home (comment)    Frequency  Add 3 additional days to program exercise sessions.    Initial Home Exercises Provided  08/09/19       Nutrition:  Target Goals: Understanding of nutrition guidelines, daily intake of sodium <1573m, cholesterol <2083m calories 30% from fat and 7% or less from saturated fats, daily to have 5 or more servings  of fruits and vegetables.  Biometrics: Pre Biometrics - 07/27/19 1028      Pre Biometrics   Waist Circumference  40 inches    Hip Circumference  39 inches    Waist to Hip Ratio  1.03 %    Triceps Skinfold  30 mm    % Body Fat  30.3 %    Grip Strength  40 kg    Flexibility  14.5 in    Single Leg Stand  11.06 seconds        Nutrition Therapy Plan and Nutrition Goals:   Nutrition Assessments:   Nutrition Goals Re-Evaluation:   Nutrition Goals Discharge (Final Nutrition Goals Re-Evaluation):   Psychosocial: Target Goals: Acknowledge presence or absence of significant depression and/or stress, maximize coping skills, provide positive support system. Participant is able to verbalize types and ability to use techniques and skills needed for reducing stress and depression.  Initial Review & Psychosocial Screening: Initial Psych Review & Screening - 07/27/19 1102      Initial Review   Current issues with  None Identified      Family Dynamics   Good Support System?  Yes   Lanny Griffin has his wife for support     Barriers   Psychosocial barriers to participate in program  There are no identifiable barriers or psychosocial needs.      Screening Interventions   Interventions   Encouraged to exercise       Quality of Life Scores: Quality of Life - 07/27/19 1045      Quality of Life   Select  Quality of Life      Quality of Life Scores   Health/Function Pre  26.2 %    Socioeconomic Pre  28 %    Psych/Spiritual Pre  30 %    Family Pre  23.7 %    GLOBAL Pre  26.95 %      Scores of 19 and below usually indicate a poorer quality of life in these areas.  A difference of  2-3 points is a clinically meaningful difference.  A difference of 2-3 points in the total score of the Quality of Life Index has been associated with significant improvement in overall quality of life, self-image, physical symptoms, and general health in studies assessing change in quality of life.  PHQ-9: Recent Review Flowsheet Data    Depression screen [facility] 2/9 07/27/2019   Decreased Interest 0   Down, Depressed, Hopeless 0   PHQ - 2 Score 0     Interpretation of Total Score  Total Score Depression Severity:  1-4 = Minimal depression, 5-9 = Mild depression, 10-14 = Moderate depression, 15-19 = Moderately severe depression, 20-27 = Severe depression   Psychosocial Evaluation and Intervention:   Psychosocial Re-Evaluation: Psychosocial Re-Evaluation    [place] Name 08/24/19 1427             Psychosocial Re-Evaluation   Current issues with  None Identified       Interventions  Encouraged to attend Cardiac Rehabilitation for the exercise       Continue Psychosocial Services   No Follow up required          Psychosocial Discharge (Final Psychosocial Re-Evaluation): Psychosocial Re-Evaluation - 08/24/19 1427      Psychosocial Re-Evaluation   Current issues with  None Identified    Interventions  Encouraged to attend Cardiac Rehabilitation for the exercise    Continue Psychosocial Services   No Follow up required       Vocational  Rehabilitation: Provide vocational rehab assistance to qualifying candidates.   Vocational Rehab Evaluation & Intervention: Vocational Rehab -  07/27/19 1059      Initial Vocational Rehab Evaluation & Intervention   Assessment shows need for Vocational Rehabilitation  No   Lanny Griffin works part time and does not need vocational rehab at this time      Education: Education Goals: Education classes will be provided on a weekly basis, covering required topics. Participant will state understanding/return demonstration of topics presented.  Learning Barriers/Preferences: Learning Barriers/Preferences - 07/27/19 1155      Learning Barriers/Preferences   Learning Barriers  None    Learning Preferences  Audio;Skilled Demonstration;Individual Instruction;Video;Verbal Instruction       Education Topics: Hypertension, Hypertension Reduction -Define heart disease and high blood pressure. Discus how high blood pressure affects the body and ways to reduce high blood pressure.   Exercise and Your Heart -Discuss why it is important to exercise, the FITT principles of exercise, normal and abnormal responses to exercise, and how to exercise safely.   Angina -Discuss definition of angina, causes of angina, treatment of angina, and how to decrease risk of having angina.   Cardiac Medications -Review what the following cardiac medications are used for, how they affect the body, and side effects that may occur when taking the medications.  Medications include Aspirin, Beta blockers, calcium channel blockers, ACE Inhibitors, angiotensin receptor blockers, diuretics, digoxin, and antihyperlipidemics.   Congestive Heart Failure -Discuss the definition of CHF, how to live with CHF, the signs and symptoms of CHF, and how keep track of weight and sodium intake.   Heart Disease and Intimacy -Discus the effect sexual activity has on the heart, how changes occur during intimacy as we age, and safety during sexual activity.   Smoking Cessation / COPD -Discuss different methods to quit smoking, the health benefits of quitting smoking, and the  definition of COPD.   Nutrition I: Fats -Discuss the types of cholesterol, what cholesterol does to the heart, and how cholesterol levels can be controlled.   Nutrition II: Labels -Discuss the different components of food labels and how to read food label   Heart Parts/Heart Disease and PAD -Discuss the anatomy of the heart, the pathway of blood circulation through the heart, and these are affected by heart disease.   Stress I: Signs and Symptoms -Discuss the causes of stress, how stress may lead to anxiety and depression, and ways to limit stress.   Stress II: Relaxation -Discuss different types of relaxation techniques to limit stress.   Warning Signs of Stroke / TIA -Discuss definition of a stroke, what the signs and symptoms are of a stroke, and how to identify when someone is having stroke.   Knowledge Questionnaire Score: Knowledge Questionnaire Score - 07/27/19 1046      Knowledge Questionnaire Score   Pre Score  18/24       Core Components/Risk Factors/Patient Goals at Admission: Personal Goals and Risk Factors at Admission - 07/27/19 1156      Core Components/Risk Factors/Patient Goals on Admission    Weight Management  Yes;Weight Maintenance;Weight Loss    Admit Weight  204 lb 9.4 oz (92.8 kg)    Hypertension  Yes    Intervention  Provide education on lifestyle modifcations including regular physical activity/exercise, weight management, moderate sodium restriction and increased consumption of fresh fruit, vegetables, and low fat dairy, alcohol moderation, and smoking cessation.;Monitor prescription use compliance.    Expected Outcomes  Short Term: Continued assessment and  intervention until BP is < 140/43m HG in hypertensive participants. < 130/849mHG in hypertensive participants with diabetes, heart failure or chronic kidney disease.;Long Term: Maintenance of blood pressure at goal levels.    Lipids  Yes    Intervention  Provide education and support for  participant on nutrition & aerobic/resistive exercise along with prescribed medications to achieve LDL <707mHDL >7m52m  Expected Outcomes  Long Term: Cholesterol controlled with medications as prescribed, with individualized exercise RX and with personalized nutrition plan. Value goals: LDL < 70mg43mL > 40 mg.;Short Term: Participant states understanding of desired cholesterol values and is compliant with medications prescribed. Participant is following exercise prescription and nutrition guidelines.       Core Components/Risk Factors/Patient Goals Review:  Goals and Risk Factor Review    Row Name 08/24/19 1427             Core Components/Risk Factors/Patient Goals Review   Personal Goals Review  Weight Management/Obesity;Lipids;Stress;Hypertension       Review  Samuel Hurstbeen doing well with exercise. Samuel's vital signs have been stable       Expected Outcomes  Patient will continue to exercise at cardiac rehab for exercise, nutrtion and lifestyle modifications.          Core Components/Risk Factors/Patient Goals at Discharge (Final Review):  Goals and Risk Factor Review - 08/24/19 1427      Core Components/Risk Factors/Patient Goals Review   Personal Goals Review  Weight Management/Obesity;Lipids;Stress;Hypertension    Review  Samuel Hurstbeen doing well with exercise. Samuel's vital signs have been stable    Expected Outcomes  Patient will continue to exercise at cardiac rehab for exercise, nutrtion and lifestyle modifications.       ITP Comments: ITP Comments    Row Name 07/27/19 1027 08/24/19 1425         ITP Comments  Dr. TraciFransico Himical Director  30 Day ITP Review. Samuel Hurstith good attendance and participation in phase 2 cardiac rehab.         Comments: See ITP comments.MariaBarnet PallBSN 08/24/2019 2:33 PM

## 2019-08-25 ENCOUNTER — Encounter (HOSPITAL_COMMUNITY)
Admission: RE | Admit: 2019-08-25 | Discharge: 2019-08-25 | Disposition: A | Discharge status: Home / Self Care | Payer: PPO | Source: Ambulatory Visit | Attending: Cardiovascular Disease | Admitting: Cardiovascular Disease

## 2019-08-25 ENCOUNTER — Other Ambulatory Visit: Payer: Self-pay

## 2019-08-25 DIAGNOSIS — I214 Non-ST elevation (NSTEMI) myocardial infarction: Secondary | ICD-10-CM | POA: Diagnosis not present

## 2019-08-25 DIAGNOSIS — Z951 Presence of aortocoronary bypass graft: Secondary | ICD-10-CM

## 2019-08-28 ENCOUNTER — Other Ambulatory Visit: Payer: Self-pay

## 2019-08-28 ENCOUNTER — Encounter (HOSPITAL_COMMUNITY)
Admission: RE | Admit: 2019-08-28 | Discharge: 2019-08-28 | Disposition: A | Discharge status: Home / Self Care | Payer: PPO | Source: Ambulatory Visit | Attending: Cardiovascular Disease | Admitting: Cardiovascular Disease

## 2019-08-28 DIAGNOSIS — I214 Non-ST elevation (NSTEMI) myocardial infarction: Secondary | ICD-10-CM | POA: Diagnosis not present

## 2019-08-28 DIAGNOSIS — Z951 Presence of aortocoronary bypass graft: Secondary | ICD-10-CM

## 2019-08-30 ENCOUNTER — Other Ambulatory Visit: Payer: Self-pay

## 2019-08-30 ENCOUNTER — Encounter (HOSPITAL_COMMUNITY)
Admission: RE | Admit: 2019-08-30 | Discharge: 2019-08-30 | Disposition: A | Discharge status: Home / Self Care | Payer: PPO | Source: Ambulatory Visit | Attending: Cardiovascular Disease | Admitting: Cardiovascular Disease

## 2019-08-30 DIAGNOSIS — I214 Non-ST elevation (NSTEMI) myocardial infarction: Secondary | ICD-10-CM

## 2019-08-30 DIAGNOSIS — Z951 Presence of aortocoronary bypass graft: Secondary | ICD-10-CM

## 2019-09-01 ENCOUNTER — Other Ambulatory Visit: Payer: Self-pay

## 2019-09-01 ENCOUNTER — Encounter (HOSPITAL_COMMUNITY)
Admission: RE | Admit: 2019-09-01 | Discharge: 2019-09-01 | Disposition: A | Discharge status: Home / Self Care | Payer: PPO | Source: Ambulatory Visit | Attending: Cardiovascular Disease | Admitting: Cardiovascular Disease

## 2019-09-01 DIAGNOSIS — I214 Non-ST elevation (NSTEMI) myocardial infarction: Secondary | ICD-10-CM

## 2019-09-01 DIAGNOSIS — Z951 Presence of aortocoronary bypass graft: Secondary | ICD-10-CM

## 2019-09-03 ENCOUNTER — Other Ambulatory Visit: Payer: Self-pay | Admitting: Physician Assistant

## 2019-09-04 ENCOUNTER — Encounter (HOSPITAL_COMMUNITY)
Admission: RE | Admit: 2019-09-04 | Discharge: 2019-09-04 | Disposition: A | Discharge status: Home / Self Care | Payer: PPO | Source: Ambulatory Visit | Attending: Cardiovascular Disease | Admitting: Cardiovascular Disease

## 2019-09-04 ENCOUNTER — Other Ambulatory Visit: Payer: Self-pay

## 2019-09-04 DIAGNOSIS — Z951 Presence of aortocoronary bypass graft: Secondary | ICD-10-CM

## 2019-09-04 DIAGNOSIS — I214 Non-ST elevation (NSTEMI) myocardial infarction: Secondary | ICD-10-CM

## 2019-09-06 ENCOUNTER — Other Ambulatory Visit: Payer: Self-pay

## 2019-09-06 ENCOUNTER — Encounter (HOSPITAL_COMMUNITY)
Admission: RE | Admit: 2019-09-06 | Discharge: 2019-09-06 | Disposition: A | Discharge status: Home / Self Care | Payer: PPO | Source: Ambulatory Visit | Attending: Cardiovascular Disease | Admitting: Cardiovascular Disease

## 2019-09-06 VITALS — Ht 72.0 in | Wt 204.1 lb

## 2019-09-06 DIAGNOSIS — Z951 Presence of aortocoronary bypass graft: Secondary | ICD-10-CM

## 2019-09-06 DIAGNOSIS — I214 Non-ST elevation (NSTEMI) myocardial infarction: Secondary | ICD-10-CM | POA: Diagnosis not present

## 2019-09-07 ENCOUNTER — Encounter: Payer: Self-pay | Admitting: Cardiovascular Disease

## 2019-09-07 ENCOUNTER — Ambulatory Visit: Payer: PPO | Admitting: Cardiovascular Disease

## 2019-09-07 VITALS — BP 134/96 | HR 78 | Ht 74.5 in | Wt 205.8 lb

## 2019-09-07 DIAGNOSIS — I2510 Atherosclerotic heart disease of native coronary artery without angina pectoris: Secondary | ICD-10-CM | POA: Diagnosis not present

## 2019-09-07 DIAGNOSIS — E785 Hyperlipidemia, unspecified: Secondary | ICD-10-CM

## 2019-09-07 DIAGNOSIS — I469 Cardiac arrest, cause unspecified: Secondary | ICD-10-CM

## 2019-09-07 MED ORDER — NITROGLYCERIN 0.4 MG SL SUBL: 0.4000 mg | SUBLINGUAL_TABLET | SUBLINGUAL | 6 refills | Status: DC | PRN

## 2019-09-07 NOTE — Patient Instructions (Signed)

## 2019-09-07 NOTE — Progress Notes (Signed)
Cardiology Office Note   Date:  09/07/2019   ID:  Grayer, Szafran May 24, 1946, MRN [id number]  PCP:  Lorene Dy, MD  Cardiologist:  Mertie Moores, MD   Chief Complaint  Patient presents with  . Coronary Artery Disease   Problem list: 1. Coronary artery disease-status post 2.5 x 18 mm Promus stent positioned in the ramus intermediate vessel (10/29/08),  S/P CABG in July, 2020  2. Hyperlipidemia 3. RBBB   [name] is a 73 yo with a hx of CAD. He is exercising regularly. No angina  February 17, 2013: No angina. He had bilateral knee replacement. He is working out 3-4 days a week - 1-1/2 hour each time.   February 21, 2014:  Riding on the stationary bike regularly. Also boxing (speed bag).  No angina. He had pneumonia this past winter - total of 4 weeks of abx.   February 21, 2015:   [name]  is a 73 y.o. male who presents for his CAD Is working out regulary Still working part time.    February 27, 2016:  [name] is doing well. Boxing with the speed bag and the heavy bad Stationary bike, sit ups .  Weights.  No CP , no dyspnea.   February 26, 2017  [name] is doing well  BP is a bit elevated here.   Its normal at the [facility] - checks it regularly  No CP , no dyspnea.   Oct. 1, 2020   Had a cardiac arrest in June,  Cath showed 3 V cad,   Had CABG on 06/08/19. Doing well.  No CP  Finishing cardiac rehab  Past Medical History:  Diagnosis Date  . Arthritis    OA AND PAIN BOTH KNEES  . Asthma    AS A CHILD  . Chronic knee pain   . Coronary artery disease    a. PTCA/stenting ramus inter. vess.; 10/2008,  b. cardiac arrest 06/03/2019, CABG X3 LIMA-LAD, SVG-RI, SVG-Circ 06/08/2019  . GERD (gastroesophageal reflux disease)    MILD--WOULD TAKE ROLAID IF NEEDED  . Hernia    "AT MY BELLY BUTTON" - NO PAIN OR PROBLEMS  . Hyperlipidemia   . Hypertension   . Restless leg syndrome   . Seasonal allergies    FREQENT BRONCHITIS. SINUS INFECTIONS WITH SEASON CHANGES  . Shingles  DEC 2012   MILD CASE-NO RESIDUAL PROBLEMS  . Sleep apnea 07/06/12   STOP BANG SCORE OF 4    Past Surgical History:  Procedure Laterality Date  . CARDIAC CATHETERIZATION     10/30/2008;  PTCA/stent ramus interm. vess.  . CORONARY ANGIOPLASTY    . CORONARY ARTERY BYPASS GRAFT N/A 06/08/2019   Procedure: CORONARY ARTERY BYPASS GRAFTING (CABG), ON PUMP, TIMES THREE, USING LEFT INTERNAL MAMMARY ARTERY AND ENDOSCOPICALLY HARVESTED LEFT GREATER SAPHENOUS VEIN;  Surgeon: Ivin Poot, MD;  Location: [facility];  Service: Open Heart Surgery;  Laterality: N/A;  . CORONARY/GRAFT ACUTE MI REVASCULARIZATION N/A 06/04/2019   Procedure: Coronary/Graft Acute MI Revascularization;  Surgeon: Burnell Blanks, MD;  Location: [facility];  Service: Cardiovascular;  Laterality: N/A;  . JOINT REPLACEMENT    . KNEE SURGERY     right and left  . LEFT HEART CATH AND CORONARY ANGIOGRAPHY N/A 06/04/2019   Procedure: LEFT HEART CATH AND CORONARY ANGIOGRAPHY;  Surgeon: Burnell Blanks, MD;  Location: [facility];  Service: Cardiovascular;  Laterality: N/A;  . TEE WITHOUT CARDIOVERSION N/A 06/08/2019   Procedure: TRANSESOPHAGEAL ECHOCARDIOGRAM (TEE);  Surgeon: Prescott Gum, Collier Salina, MD;  Location: [facility];  Service: Open Heart Surgery;  Laterality: N/A;  . TOTAL KNEE ARTHROPLASTY  07/26/2012   Procedure: TOTAL KNEE ARTHROPLASTY;  Surgeon: Mauri Pole, MD;  Location: [facility];  Service: Orthopedics;  Laterality: Left;  . TOTAL KNEE ARTHROPLASTY  10/25/2012   Procedure: TOTAL KNEE ARTHROPLASTY;  Surgeon: Mauri Pole, MD;  Location: [facility];  Service: Orthopedics;  Laterality: Right;     Current Outpatient Medications  Medication Sig Dispense Refill  . aspirin EC 81 MG tablet Take 81 mg by mouth every morning.    Marland Kitchen atorvastatin (LIPITOR) 80 MG tablet Take 80 mg by mouth daily.    . clopidogrel (PLAVIX) 75 MG tablet Take 1 tablet (75 mg total) by mouth every morning. 90 tablet 3  . diphenhydrAMINE  (BENADRYL) 25 MG tablet Take 25 mg by mouth daily as needed for allergies.     . famotidine (PEPCID) 20 MG tablet Take 20 mg by mouth daily.    . Fluticasone Propionate (FLONASE NA) Place 1 spray into the nose as needed (allergies). For sinus infection    . losartan (COZAAR) 25 MG tablet Take 25 mg by mouth daily.    . metoprolol tartrate (LOPRESSOR) 25 MG tablet TAKE 1 TABLET BY MOUTH TWICE A DAY 180 tablet 1  . vitamin B-12 (CYANOCOBALAMIN) 1000 MCG tablet Take 1,000 mcg by mouth daily.    Marland Kitchen zinc gluconate 50 MG tablet Take 25 mg by mouth daily.     . nitroGLYCERIN (NITROSTAT) 0.4 MG SL tablet Place 1 tablet (0.4 mg total) under the tongue every 5 (five) minutes as needed for chest pain. 25 tablet 6   No current facility-administered medications for this visit.     Allergies:   Clarithromycin    Social History:  The patient  reports that he has never smoked. He has quit using smokeless tobacco. He reports current alcohol use. He reports that he does not use drugs.   Family History:  The patient's family history includes CAD in his father; COPD in his father; Hypertension in his mother.    ROS:  Please see the history of present illness.     Physical Exam: Blood pressure (!) 134/96, pulse 78, height 6' 2.5" (1.892 m), weight 205 lb 12.8 oz (93.4 kg), SpO2 98 %.  GEN:  Well nourished, well developed in no acute distress HEENT: Normal NECK: No JVD; No carotid bruits LYMPHATICS: No lymphadenopathy CARDIAC: RRR , no murmurs, rubs, gallops, sternotomy scar is well. RESPIRATORY:  Clear to auscultation without rales, wheezing or rhonchi  ABDOMEN: Soft, non-tender, non-distended MUSCULOSKELETAL:   Trace edema in right lower leg  SKIN: Warm and dry NEUROLOGIC:  Alert and oriented x 3  EKG:     Recent Labs: 06/05/2019: TSH 2.029 06/09/2019: Magnesium 2.9 07/14/2019: BUN 13; Creatinine, Ser 0.92; Hemoglobin 11.3; Platelets 192; Potassium 3.7; Sodium 138 08/07/2019: ALT 21    Lipid  Panel    Component Value Date/Time   CHOL 144 08/07/2019 1050   TRIG 114 08/07/2019 1050   HDL 59 08/07/2019 1050   CHOLHDL 2.4 08/07/2019 1050   CHOLHDL 2.5 06/04/2019 1445   VLDL 23 06/04/2019 1445   LDLCALC 65 08/07/2019 1050      Wt Readings from Last 3 Encounters:  09/07/19 205 lb 12.8 oz (93.4 kg)  09/06/19 204 lb 2.3 oz (92.6 kg)  08/23/19 205 lb 4 oz (93.1 kg)      Other studies Reviewed: Additional studies/  records that were reviewed today include: . Review of the above records demonstrates:   ASSESSMENT AND PLAN:   1. Coronary artery disease-status post 2.5 x 18 mm Promus stent positioned in the ramus intermediate vessel (10/29/08)  S/p cardiac arrest in June, 2020 .   Cath showed 3 V CAD Had CABG 06/08/2019.   Is doing great ,  No angina.  Has completed rehab.   2. Hyperlipidemia -recent labs from August 07, 2019 show a total cholesterol of 144.  The HDL is 59.  Triglyceride levels 114.  The LDL is 65.  3. RBBB -    Current medicines are reviewed at length with the patient today.  The patient does not have concerns regarding medicines.  The following changes have been made:  no change  Labs/ tests ordered today include:  No orders of the defined types were placed in this encounter.    Disposition:   FU with APP in 6 months .  I will see him in a year    Signed, Mertie Moores, MD  09/07/2019 10:17 AM    [facility] [address], [place], [place]  [address] Phone: [phone]; Fax: [phone]

## 2019-09-08 ENCOUNTER — Encounter (HOSPITAL_COMMUNITY)
Admission: RE | Admit: 2019-09-08 | Discharge: 2019-09-08 | Disposition: A | Discharge status: Home / Self Care | Payer: PPO | Source: Ambulatory Visit | Attending: Cardiovascular Disease | Admitting: Cardiovascular Disease

## 2019-09-08 ENCOUNTER — Other Ambulatory Visit: Payer: Self-pay | Admitting: Cardiovascular Disease

## 2019-09-08 ENCOUNTER — Other Ambulatory Visit: Payer: Self-pay

## 2019-09-08 DIAGNOSIS — I214 Non-ST elevation (NSTEMI) myocardial infarction: Secondary | ICD-10-CM

## 2019-09-08 DIAGNOSIS — Z951 Presence of aortocoronary bypass graft: Secondary | ICD-10-CM | POA: Diagnosis not present

## 2019-09-08 NOTE — Progress Notes (Signed)
Discharge Progress Report  Patient Details  Name: [name] MRN: [id number] Date of Birth: [birth year] Referring Provider:     [place] from 07/27/2019 in [facility]  Referring Provider  Dr. Cathie Olden       Number of Visits: 18  Reason for Discharge:  Patient reached a stable level of exercise. Patient independent in their exercise. Patient has met program and personal goals.  Smoking History:  Social History   Tobacco Use  Smoking Status Never Smoker  Smokeless Tobacco Former User    Diagnosis:  S/P CABG x 3 06/08/19  Non-ST elevation (NSTEMI) myocardial infarction ([facility]) 06/04/19  ADL UCSD:   Initial Exercise Prescription: Initial Exercise Prescription - 07/27/19 1100      Date of Initial Exercise RX and Referring Provider   Date  07/27/19    Referring Provider  Dr. Cathie Olden    Expected Discharge Date  09/01/19      Treadmill   MPH  3    Grade  1    Minutes  15      NuStep   Level  3    SPM  85    Minutes  15    METs  3      Prescription Details   Frequency (times per week)  3    Duration  Progress to 30 minutes of continuous aerobic without signs/symptoms of physical distress      Intensity   THRR 40-80% of Max Heartrate  58-117    Ratings of Perceived Exertion  11-13      Progression   Progression  Continue to progress workloads to maintain intensity without signs/symptoms of physical distress.      Resistance Training   Training Prescription  Yes    Weight  4 lbs.     Reps  10-15       Discharge Exercise Prescription (Final Exercise Prescription Changes): Exercise Prescription Changes - 09/08/19 1100      Response to Exercise   Blood Pressure (Admit)  130/60    Blood Pressure (Exercise)  140/72    Blood Pressure (Exit)  128/78    Heart Rate (Admit)  72 bpm    Heart Rate (Exercise)  111 bpm    Heart Rate (Exit)  71 bpm    Rating of Perceived Exertion (Exercise)  12    Perceived  Dyspnea (Exercise)  0    Symptoms  none    Comments  Pt graduated CR program today.     Duration  Continue with 30 min of aerobic exercise without signs/symptoms of physical distress.    Intensity  THRR unchanged      Progression   Progression  Continue to progress workloads to maintain intensity without signs/symptoms of physical distress.    Average METs  4.1      Resistance Training   Training Prescription  Yes    Weight  7 lbs.     Reps  10-15    Time  10 Minutes      Treadmill   MPH  3    Grade  1    Minutes  15    METs  3.71      Recumbant Bike   Level  3    Minutes  15    METs  4.1      Home Exercise Plan   Plans to continue exercise at  Home (comment)    Frequency  Add 3 additional days  to program exercise sessions.    Initial Home Exercises Provided  08/09/19       Functional Capacity: 6 Minute Walk    Row Name 07/27/19 1149 09/01/19 1120       6 Minute Walk   Phase  Initial  Discharge    Distance  1515 feet  2000 feet    Distance % Change  -  32.01 %    Distance Feet Change  -  485 ft    Walk Time  6 minutes  6 minutes    # of Rest Breaks  0  0    MPH  2.8  3.7    METS  3.4  4.3    RPE  11  11    Perceived Dyspnea   -  0    VO2 Peak  12.19  15.16    Symptoms  No  No    Resting HR  86 bpm  72 bpm    Resting BP  138/64  128/66    Resting Oxygen Saturation   99 %  -    Exercise Oxygen Saturation  during 6 min walk  96 %  -    Max Ex. HR  114 bpm  111 bpm    Max Ex. BP  158/84  162/86    2 Minute Post BP  138/78  128/72       Psychological, QOL, Others - Outcomes: PHQ 2/9: Depression screen [facility] 2/9 09/08/2019 07/27/2019  Decreased Interest 0 0  Down, Depressed, Hopeless 0 0  PHQ - 2 Score 0 0    Quality of Life: Quality of Life - 09/01/19 1120      Quality of Life Scores   Health/Function Post  29.2 %    Socioeconomic Post  30 %    Psych/Spiritual Post  30 %    Family Post  27.6 %    GLOBAL Post  29.31 %       Personal  Goals: Goals established at orientation with interventions provided to work toward goal. Personal Goals and Risk Factors at Admission - 07/27/19 1156      Core Components/Risk Factors/Patient Goals on Admission    Weight Management  Yes;Weight Maintenance;Weight Loss    Admit Weight  204 lb 9.4 oz (92.8 kg)    Hypertension  Yes    Intervention  Provide education on lifestyle modifcations including regular physical activity/exercise, weight management, moderate sodium restriction and increased consumption of fresh fruit, vegetables, and low fat dairy, alcohol moderation, and smoking cessation.;Monitor prescription use compliance.    Expected Outcomes  Short Term: Continued assessment and intervention until BP is < 140/74m HG in hypertensive participants. < 130/835mHG in hypertensive participants with diabetes, heart failure or chronic kidney disease.;Long Term: Maintenance of blood pressure at goal levels.    Lipids  Yes    Intervention  Provide education and support for participant on nutrition & aerobic/resistive exercise along with prescribed medications to achieve LDL <7049mHDL >66m20m  Expected Outcomes  Long Term: Cholesterol controlled with medications as prescribed, with individualized exercise RX and with personalized nutrition plan. Value goals: LDL < 70mg54mL > 40 mg.;Short Term: Participant states understanding of desired cholesterol values and is compliant with medications prescribed. Participant is following exercise prescription and nutrition guidelines.        Personal Goals Discharge: Goals and Risk Factor Review    Row Name 08/24/19 1427 09/08/19 [id number]  Core Components/Risk Factors/Patient Goals Review   Personal Goals Review  Weight Management/Obesity;Lipids;Stress;Hypertension  Weight Management/Obesity;Lipids;Stress;Hypertension      Review  [name] has been doing well with exercise. Keith's vital signs have been stable  [name] has been doing well with exercise.  Keith's vital signs have been stable. [name] plans to      Expected Outcomes  Patient will continue to exercise at cardiac rehab for exercise, nutrtion and lifestyle modifications.  -         Exercise Goals and Review: Exercise Goals    Row Name 07/27/19 1154             Exercise Goals   Increase Physical Activity  Yes       Intervention  Provide advice, education, support and counseling about physical activity/exercise needs.;Develop an individualized exercise prescription for aerobic and resistive training based on initial evaluation findings, risk stratification, comorbidities and participant's personal goals.       Expected Outcomes  Short Term: Attend rehab on a regular basis to increase amount of physical activity.;Long Term: Add in home exercise to make exercise part of routine and to increase amount of physical activity.;Long Term: Exercising regularly at least 3-5 days a week.       Increase Strength and Stamina  Yes       Intervention  Provide advice, education, support and counseling about physical activity/exercise needs.;Develop an individualized exercise prescription for aerobic and resistive training based on initial evaluation findings, risk stratification, comorbidities and participant's personal goals.       Expected Outcomes  Short Term: Increase workloads from initial exercise prescription for resistance, speed, and METs.;Short Term: Perform resistance training exercises routinely during rehab and add in resistance training at home;Long Term: Improve cardiorespiratory fitness, muscular endurance and strength as measured by increased METs and functional capacity (6MWT)       Able to understand and use rate of perceived exertion (RPE) scale  Yes       Intervention  Provide education and explanation on how to use RPE scale       Expected Outcomes  Short Term: Able to use RPE daily in rehab to express subjective intensity level;Long Term:  Able to use RPE to guide intensity level  when exercising independently       Knowledge and understanding of Target Heart Rate Range (THRR)  Yes       Intervention  Provide education and explanation of THRR including how the numbers were predicted and where they are located for reference       Expected Outcomes  Short Term: Able to state/look up THRR;Long Term: Able to use THRR to govern intensity when exercising independently;Short Term: Able to use daily as guideline for intensity in rehab       Able to check pulse independently  Yes       Intervention  Provide education and demonstration on how to check pulse in carotid and radial arteries.;Review the importance of being able to check your own pulse for safety during independent exercise       Expected Outcomes  Short Term: Able to explain why pulse checking is important during independent exercise;Long Term: Able to check pulse independently and accurately       Understanding of Exercise Prescription  Yes       Intervention  Provide education, explanation, and written materials on patient's individual exercise prescription       Expected Outcomes  Short Term: Able to explain program exercise prescription;Long Term: Able  to explain home exercise prescription to exercise independently          Exercise Goals Re-Evaluation: Exercise Goals Re-Evaluation    Row Name 07/31/19 1016 08/09/19 1040 08/21/19 1148 09/08/19 1138       Exercise Goal Re-Evaluation   Exercise Goals Review  Able to understand and use rate of perceived exertion (RPE) scale;Increase Physical Activity  Increase Physical Activity;Increase Strength and Stamina;Able to understand and use rate of perceived exertion (RPE) scale;Knowledge and understanding of Target Heart Rate Range (THRR);Able to check pulse independently;Understanding of Exercise Prescription  Increase Physical Activity;Increase Strength and Stamina;Able to understand and use rate of perceived exertion (RPE) scale;Knowledge and understanding of Target Heart  Rate Range (THRR);Able to check pulse independently;Understanding of Exercise Prescription  Increase Physical Activity;Increase Strength and Stamina;Able to understand and use rate of perceived exertion (RPE) scale;Able to check pulse independently;Knowledge and understanding of Target Heart Rate Range (THRR);Understanding of Exercise Prescription    Comments  Patient tolerated 1st exercise session well, decreased incline on TM due to elevated HR. Pt able to understand and use RPE scale appropriately.  Reviewed HEP with Pt. Pt understands goals, exercise Rx, RPE scale, THRR, warm up and cool down exercises, temperature precautions, NTG use, and end points of exercise. Pt is currently exercising at a local fire deptartment by using the treadmill, stationary bike, and weights 2-4 days per week for 30-45 minutes in addition to CR program.  Reviewed METs and goals with Pt. Pt average MET level is 3.1 Pt is progressing well and increasing workloads gradually. Pt is currently exercising 30-45 minutes a day by using a treadmill, bike, and weights in addition to CR program.  Pt graduated CR program today. Pt progressed well in the program and increased workloads and MET level gradually. MET level reached a 4.1. 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 by using a treadmill, weights, rec bike, and doing light boxing for conditioning.    Expected Outcomes  Progress workloads as tolerated to help increase cardiorespiratory fitness.  Will continue to monitor and progress Pt as tolerated.  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       Nutrition & Weight - Outcomes: Pre Biometrics - 07/27/19 1028      Pre Biometrics   Waist Circumference  40 inches    Hip Circumference  39 inches    Waist to Hip Ratio  1.03 %    Triceps Skinfold  30 mm    % Body Fat  30.3 %    Grip Strength  40 kg    Flexibility  14.5 in    Single Leg Stand  11.06 seconds       Post Biometrics - 09/06/19 1154       Post  Biometrics   Height  6' (1.829 m)    Weight  92.6 kg    Waist Circumference  39 inches    Hip Circumference  40.5 inches    Waist to Hip Ratio  0.96 %    BMI (Calculated)  27.68    Triceps Skinfold  28 mm    % Body Fat  29.5 %    Grip Strength  43 kg    Flexibility  12 in    Single Leg Stand  10.87 seconds       Nutrition:   Nutrition Discharge:   Education Questionnaire Score: Knowledge Questionnaire Score - 09/01/19 1122  Knowledge Questionnaire Score   Post Score  20/24       Goals reviewed with patient; copy given to patient.Pt graduated from cardiac rehab program today with completion of 18 exercise sessions in Phase II. Pt maintained good attendance and progressed nicely during his participation in rehab as evidenced by increased MET level.   Medication list reconciled. Repeat  PHQ score-  0.  Pt has made significant lifestyle changes and should be commended for his success. Pt feels he has achieved his goals during cardiac rehab.   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 [name] increased his distance on his post exercise walk test and maintained his current weight. We are proud of Keith's progress.Barnet Pall, RN,BSN 09/14/2019 11:35 AM

## 2019-09-18 ENCOUNTER — Telehealth: Payer: Self-pay | Admitting: Cardiovascular Disease

## 2019-09-18 ENCOUNTER — Encounter: Payer: Self-pay | Admitting: Nurse Practitioner

## 2019-09-18 NOTE — Telephone Encounter (Signed)
Spoke with patient who states he is going to do some light work at [employer] and he wants some limitations to the type of work that he might be asked to do. States he has a letter of clearance from Dr. Nils Pyle by he would appreciate a more detailed letter from Dr. Acie Fredrickson. States he understands he can only lift 30 lb through end of the year Wants to work 15-20 hours per week and wants specifics regarding hydration and avoiding getting over-heated. I advised him that letter will be ready for him to pick up after tomorrow. He verbalized understanding and thanked me for my help.

## 2019-09-18 NOTE — Telephone Encounter (Signed)
New Message:  Patient will need a letter from Dr. Acie Fredrickson that it is safe for him to return to work.  The letter will need to have restrictions like lifting (less than 30 lbs) for the remainder of the year, heat restrictions(he can not get overheated) and general over-exertion.  This will protect him from doing specific jobs that will cause him to get over-exerted.   He meant to ask for the letter during his office visit on 10/01. He can pick up the letter at the office whenever it is ready.

## 2019-09-21 ENCOUNTER — Other Ambulatory Visit: Payer: Self-pay | Admitting: Cardiothoracic Surgery

## 2019-10-30 ENCOUNTER — Other Ambulatory Visit: Payer: Self-pay

## 2019-10-30 DIAGNOSIS — Z20822 Contact with and (suspected) exposure to covid-19: Secondary | ICD-10-CM

## 2019-11-01 LAB — NOVEL CORONAVIRUS, NAA: SARS-CoV-2, NAA: NOT DETECTED

## 2019-12-21 ENCOUNTER — Telehealth: Payer: Self-pay | Admitting: Cardiovascular Disease

## 2019-12-21 NOTE — Telephone Encounter (Signed)
We are recommending the COVID-19 vaccine to all of our patients. Cardiac medications (including blood thinners) should not deter anyone from being vaccinated and there is no need to hold any of those medications prior to vaccine administration.     Currently, there is a hotline to call (active 12/15/19) to schedule vaccination appointments as no walk-ins will be accepted.   Number: [phone]    If you have further questions or concerns about the vaccine process, please visit [email or web address] or contact your primary care physician.

## 2020-02-06 ENCOUNTER — Telehealth: Payer: Self-pay | Admitting: Cardiovascular Disease

## 2020-02-06 NOTE — Telephone Encounter (Signed)
Agree with note by Christen Bame, RN . I doubt that Losartan is causing his cough.   He may need to follow up with an allergy doctor, or ENT or pulmonary  Agree with the trial of pepcid.

## 2020-02-06 NOTE — Telephone Encounter (Signed)
Spoke with patient who reports he is waking up with cough that resolves after he eats breakfast every morning. He thinks it is related to the losartan 25 mg that he takes daily; states he had cough with lisinopril and his PCP changed him to losartan about 1 1/2 years ago. I asked about allergies and he admits to having sinus drainage most mornings when he wakes up. He is using Flonase and takes OTC allergy medication every morning and is taking 1/2 of a mucinex tablet every night before bed. I asked about hx of acid reflux and he states he takes Pepcid PRN. States he is working out regularly without any difficulty, denies SOB or chest pain. Since his symptoms are occurring after he wakes up and resolve after breakfast, I asked him to try taking a Pepcid at bedtime and to continue the allergy medications to see if this helps his symptoms. I advised that there are other antihypertensive agents we can use but want to rule out other causes of the cough before discontinuing losartan. He agrees with plan and I will call him back in 2 days to see how he is doing. Routing to Dr. Acie Fredrickson for additional advice and plan for alternate medication in the event that cough does not improve.

## 2020-02-06 NOTE — Telephone Encounter (Signed)
Pt c/o medication issue:  1. Name of Medication: Losartan  2. How are you currently taking this medication (dosage and times per day)? Take 25 mg by mouth daily  3. Are you having a reaction (difficulty breathing--STAT)? No  4. What is your medication issue? Patient states the medication switch from Lisinopril has caused him to have a dry cough in the morning. Patient states he has not has any other symptoms. Please advise.

## 2020-02-06 NOTE — Telephone Encounter (Signed)
Patient returning call.

## 2020-02-06 NOTE — Telephone Encounter (Signed)
Left message for patient to call back  

## 2020-02-08 NOTE — Telephone Encounter (Signed)
Called patient who reports he has taken Pepcid 20 mg twice daily and woke up Wednesday morning and Thursday morning with less coughing. He states he thinks that this therapy is helping. I advised that per Dr. Acie Fredrickson, he may hold losartan 25 mg for 5 days to see if his coughing resolves. I advised that if he does this, to please call back to report. He verbalized understanding and agreement with plan and thanked me for the call.

## 2020-03-04 NOTE — Progress Notes (Signed)
Cardiology Office Note:    Date:  03/05/2020   ID:  [name], DOB [birth year], MRN [id number]  PCP:  Lorene Dy, MD  Cardiologist:  Mertie Moores, MD   Electrophysiologist:  None   Referring MD: Lorene Dy, MD   Chief Complaint:  Follow-up (CAD)    Patient Profile:    [name] is a 74 y.o. male with:   Coronary artery disease   S/p DES to RI in 2009  Myoview in 2011: neg for ischemia  S/p non-STEMI/cardiac arrest 05/2019 >> s/p CABG  Hypertension   Hyperlipidemia   GERD  OSA  RBBB  Prior CV studies: Echocardiogram 06/05/2019 Ant-sept HK, EF 50-55, mild asymmetric LVH, Gr 2 DD, normal RVSF   Cardiac catheterization 06/04/2019 LAD prox 99 RI stent ok, then 70 LCx mid 37 EF 45-50   Nuclear stress test 11/06/10  No ischemia, EF 62, normal study  Cardiac catheterization 10/29/08 LAD ostial 30 RI mid 90 LCx irregularities RCA patent EF 65 PCI: 2.5 x 18 mm Promus DES to the RI  History of Present Illness:    [name] was last seen by Dr. Acie Fredrickson in 09/2019.    He returns for follow-up.  He is here alone.  He has been doing well without chest discomfort, shortness of breath, syncope, leg swelling.  He remains very active.  He lifts weights, walks/runs on a treadmill and uses a stationary bike.     Past Medical History:  Diagnosis Date  . Arthritis    OA AND PAIN BOTH KNEES  . Asthma    AS A CHILD  . Chronic knee pain   . Coronary artery disease    a. PTCA/stenting ramus inter. vess.; 10/2008,  b. cardiac arrest 06/03/2019, CABG X3 LIMA-LAD, SVG-RI, SVG-Circ 06/08/2019  . GERD (gastroesophageal reflux disease)    MILD--WOULD TAKE ROLAID IF NEEDED  . Hernia    "AT MY BELLY BUTTON" - NO PAIN OR PROBLEMS  . Hyperlipidemia   . Hypertension   . Restless leg syndrome   . Seasonal allergies    FREQENT BRONCHITIS. SINUS INFECTIONS WITH SEASON CHANGES  . Shingles DEC 2012   MILD CASE-NO RESIDUAL PROBLEMS  . Sleep apnea 07/06/12   STOP  BANG SCORE OF 4    Current Medications: Current Meds  Medication Sig  . aspirin EC 81 MG tablet Take 81 mg by mouth every morning.  Marland Kitchen atorvastatin (LIPITOR) 80 MG tablet Take 80 mg by mouth daily.  . clopidogrel (PLAVIX) 75 MG tablet Take 1 tablet (75 mg total) by mouth every morning.  . diphenhydrAMINE (BENADRYL) 25 MG tablet Take 25 mg by mouth daily as needed for allergies.   . famotidine (PEPCID) 20 MG tablet Take 20 mg by mouth 2 (two) times daily.  . Fluticasone Propionate (FLONASE NA) Place 1 spray into the nose as needed (allergies). For sinus infection  . losartan (COZAAR) 25 MG tablet Take 25 mg by mouth daily.  . metoprolol tartrate (LOPRESSOR) 25 MG tablet TAKE 1 TABLET BY MOUTH TWICE A DAY  . vitamin B-12 (CYANOCOBALAMIN) 1000 MCG tablet Take 1,000 mcg by mouth daily.  Marland Kitchen zinc gluconate 50 MG tablet Take 25 mg by mouth daily.      Allergies:   Clarithromycin   Social History   Tobacco Use  . Smoking status: Never Smoker  . Smokeless tobacco: Former Network engineer Use Topics  . Alcohol use: Yes    Comment: occ  . Drug use: No  Family Hx: The patient's family history includes CAD in his father; COPD in his father; Hypertension in his mother.  ROS   EKGs/Labs/Other Test Reviewed:    EKG:  EKG is  ordered today.  The ekg ordered today demonstrates normal sinus rhythm, heart rate 67, right bundle branch block, PACs,?  Junctional escape beat  Recent Labs: 06/05/2019: TSH 2.029 06/09/2019: Magnesium 2.9 07/14/2019: BUN 13; Creatinine, Ser 0.92; Hemoglobin 11.3; Platelets 192; Potassium 3.7; Sodium 138 08/07/2019: ALT 21   Recent Lipid Panel Lab Results  Component Value Date/Time   CHOL 144 08/07/2019 10:50 AM   TRIG 114 08/07/2019 10:50 AM   HDL 59 08/07/2019 10:50 AM   CHOLHDL 2.4 08/07/2019 10:50 AM   CHOLHDL 2.5 06/04/2019 02:45 PM   LDLCALC 65 08/07/2019 10:50 AM    Physical Exam:    VS:  BP 130/86   Pulse 67   Ht 6' 2.5" (1.892 m)   Wt 220 lb 1.9  oz (99.8 kg)   SpO2 98%   BMI 27.88 kg/m     Wt Readings from Last 3 Encounters:  03/05/20 220 lb 1.9 oz (99.8 kg)  09/07/19 205 lb 12.8 oz (93.4 kg)  09/06/19 204 lb 2.3 oz (92.6 kg)     Constitutional:      Appearance: Healthy appearance. Not in distress.  Neck:     Vascular: JVD normal.  Pulmonary:     Breath sounds: No wheezing. No rales.  Cardiovascular:     Normal rate. Irregular rhythm. Normal S1. Normal S2.     Murmurs: There is no murmur.  Edema:    Peripheral edema absent.  Abdominal:     Palpations: Abdomen is soft. There is no hepatomegaly.  Skin:    General: Skin is warm and dry.  Neurological:     General: No focal deficit present.     Mental Status: Alert and oriented to person, place and time.       ASSESSMENT & PLAN:    1. Coronary artery disease involving native coronary artery of native heart without angina pectoris History of PCI with DES to the ramus intermediate in 2009.  He suffered a non-STEMI and cardiac arrest in June 2020 followed by CABG.  He is doing well without anginal symptoms.  He remains very active without limitations.  Continue aspirin, clopidogrel, atorvastatin, losartan, metoprolol.  2. Essential (primary) hypertension The patient's blood pressure is controlled on his current regimen.  Continue current therapy.  He still he noted a cough in the mornings and was concerned it could be from losartan.  He started on famotidine twice daily with improved symptoms.  I have recommended he follow-up with primary care for acid reflux.  3. Hyperlipidemia, unspecified hyperlipidemia type LDL optimal on most recent lab work.  Continue current Rx.    4. Premature atrial contractions He has several PACs on his electrocardiogram.  It was somewhat difficult to interpret and I was initially concerned he may be in atrial fibrillation.  However, he clearly has P waves and is in sinus rhythm.  There are some, what appear to be, junctional escape beats.  He  is not symptomatic.  I will obtain a 24-hour Holter monitor to assess for significant arrhythmia or excessive PACs/PVCs.    Dispo:  Return in about 6 months (around 09/05/2020) for Routine Follow Up, w/ Dr. Acie Fredrickson, in person.   Medication Adjustments/Labs and Tests Ordered: Current medicines are reviewed at length with the patient today.  Concerns regarding medicines are outlined  above.  Tests Ordered: Orders Placed This Encounter  Procedures  . Holter monitor - 24 hour  . EKG 12-Lead   Medication Changes: No orders of the defined types were placed in this encounter.   Signed, Richardson Dopp, PA-C  03/05/2020 9:00 AM    [facility] [place], [place], [place]  [address] Phone: [phone]; Fax: [phone]

## 2020-03-05 ENCOUNTER — Encounter: Payer: Self-pay | Admitting: Physician Assistant

## 2020-03-05 ENCOUNTER — Ambulatory Visit: Payer: PPO | Admitting: Physician Assistant

## 2020-03-05 ENCOUNTER — Other Ambulatory Visit: Payer: Self-pay

## 2020-03-05 ENCOUNTER — Ambulatory Visit: Payer: PPO | Admitting: Cardiology

## 2020-03-05 ENCOUNTER — Encounter: Payer: Self-pay | Admitting: *Deleted

## 2020-03-05 VITALS — BP 130/86 | HR 67 | Ht 74.5 in | Wt 220.1 lb

## 2020-03-05 DIAGNOSIS — I2510 Atherosclerotic heart disease of native coronary artery without angina pectoris: Secondary | ICD-10-CM

## 2020-03-05 DIAGNOSIS — I491 Atrial premature depolarization: Secondary | ICD-10-CM

## 2020-03-05 DIAGNOSIS — E785 Hyperlipidemia, unspecified: Secondary | ICD-10-CM

## 2020-03-05 DIAGNOSIS — I10 Essential (primary) hypertension: Secondary | ICD-10-CM

## 2020-03-05 NOTE — Patient Instructions (Signed)
Medication Instructions:   Your physician recommends that you continue on your current medications as directed. Please refer to the Current Medication list given to you today.  *If you need a refill on your cardiac medications before your next appointment, please call your pharmacy*  Lab Work:  None ordered today  Testing/Procedures:  Your physician has recommended that you wear a holter monitor. Holter monitors are medical devices that record the heart's electrical activity. Doctors most often use these monitors to diagnose arrhythmias. Arrhythmias are problems with the speed or rhythm of the heartbeat. The monitor is a small, portable device. You can wear one while you do your normal daily activities. This is usually used to diagnose what is causing palpitations/syncope (passing out).  Follow-Up: At [facility], you and your health needs are our priority.  As part of our continuing mission to provide you with exceptional heart care, we have created designated Provider Care Teams.  These Care Teams include your primary Cardiologist (physician) and Advanced Practice Providers (APPs -  Physician Assistants and Nurse Practitioners) who all work together to provide you with the care you need, when you need it.  We recommend signing up for the patient portal called "MyChart".  Sign up information is provided on this After Visit Summary.  MyChart is used to connect with patients for Virtual Visits (Telemedicine).  Patients are able to view lab/test results, encounter notes, upcoming appointments, etc.  Non-urgent messages can be sent to your provider as well.   To learn more about what you can do with MyChart, go to [email or web address].    Your next appointment:   6 month(s)  The format for your next appointment:   In Person  Provider:   You may see Mertie Moores, MD or Richardson Dopp, PA-C

## 2020-03-05 NOTE — Progress Notes (Signed)
Patient ID: [name], male   DOB: 1946-12-05, 74 y.o.   MRN: [id number] Patient enrolled for [employer] to mail a 3 day ZIO XT long term holter monitor to her home.  Instructions sent to patient via My Chart message, and will be included in the monitor kit as well.

## 2020-03-08 ENCOUNTER — Other Ambulatory Visit (INDEPENDENT_AMBULATORY_CARE_PROVIDER_SITE_OTHER): Payer: PPO

## 2020-03-08 DIAGNOSIS — I491 Atrial premature depolarization: Secondary | ICD-10-CM

## 2020-03-21 DIAGNOSIS — I491 Atrial premature depolarization: Secondary | ICD-10-CM | POA: Diagnosis not present

## 2020-03-25 ENCOUNTER — Encounter: Payer: Self-pay | Admitting: Physician Assistant

## 2020-03-29 ENCOUNTER — Other Ambulatory Visit: Payer: Self-pay

## 2020-03-29 MED ORDER — CLOPIDOGREL BISULFATE 75 MG PO TABS: 75.0000 mg | ORAL_TABLET | Freq: Every morning | ORAL | 3 refills | Status: DC

## 2020-03-29 NOTE — Telephone Encounter (Signed)
Pt's medication was sent to pt's pharmacy as requested. Confirmation received.  °

## 2020-04-15 DIAGNOSIS — R42 Dizziness and giddiness: Secondary | ICD-10-CM | POA: Diagnosis not present

## 2020-04-15 DIAGNOSIS — E039 Hypothyroidism, unspecified: Secondary | ICD-10-CM | POA: Diagnosis not present

## 2020-04-15 DIAGNOSIS — Z131 Encounter for screening for diabetes mellitus: Secondary | ICD-10-CM | POA: Diagnosis not present

## 2020-04-15 DIAGNOSIS — E785 Hyperlipidemia, unspecified: Secondary | ICD-10-CM | POA: Diagnosis not present

## 2020-04-15 DIAGNOSIS — R002 Palpitations: Secondary | ICD-10-CM | POA: Diagnosis not present

## 2020-04-15 DIAGNOSIS — I70219 Atherosclerosis of native arteries of extremities with intermittent claudication, unspecified extremity: Secondary | ICD-10-CM | POA: Diagnosis not present

## 2020-04-15 DIAGNOSIS — R1032 Left lower quadrant pain: Secondary | ICD-10-CM | POA: Diagnosis not present

## 2020-04-15 DIAGNOSIS — I10 Essential (primary) hypertension: Secondary | ICD-10-CM | POA: Diagnosis not present

## 2020-04-15 DIAGNOSIS — Z0000 Encounter for general adult medical examination without abnormal findings: Secondary | ICD-10-CM | POA: Diagnosis not present

## 2020-04-15 DIAGNOSIS — E7800 Pure hypercholesterolemia, unspecified: Secondary | ICD-10-CM | POA: Diagnosis not present

## 2020-04-15 DIAGNOSIS — Z79899 Other long term (current) drug therapy: Secondary | ICD-10-CM | POA: Diagnosis not present

## 2020-04-15 DIAGNOSIS — Z125 Encounter for screening for malignant neoplasm of prostate: Secondary | ICD-10-CM | POA: Diagnosis not present

## 2020-04-15 DIAGNOSIS — Z1211 Encounter for screening for malignant neoplasm of colon: Secondary | ICD-10-CM | POA: Diagnosis not present

## 2020-04-15 DIAGNOSIS — Z87891 Personal history of nicotine dependence: Secondary | ICD-10-CM | POA: Diagnosis not present

## 2020-04-15 DIAGNOSIS — H9113 Presbycusis, bilateral: Secondary | ICD-10-CM | POA: Diagnosis not present

## 2020-04-15 DIAGNOSIS — Z1389 Encounter for screening for other disorder: Secondary | ICD-10-CM | POA: Diagnosis not present

## 2020-04-15 DIAGNOSIS — R5383 Other fatigue: Secondary | ICD-10-CM | POA: Diagnosis not present

## 2020-04-15 DIAGNOSIS — D485 Neoplasm of uncertain behavior of skin: Secondary | ICD-10-CM | POA: Diagnosis not present

## 2020-04-17 DIAGNOSIS — E785 Hyperlipidemia, unspecified: Secondary | ICD-10-CM | POA: Diagnosis not present

## 2020-05-31 DIAGNOSIS — M79641 Pain in right hand: Secondary | ICD-10-CM | POA: Diagnosis not present

## 2020-06-03 DIAGNOSIS — H2513 Age-related nuclear cataract, bilateral: Secondary | ICD-10-CM | POA: Diagnosis not present

## 2020-06-03 DIAGNOSIS — H5203 Hypermetropia, bilateral: Secondary | ICD-10-CM | POA: Diagnosis not present

## 2020-06-03 DIAGNOSIS — H524 Presbyopia: Secondary | ICD-10-CM | POA: Diagnosis not present

## 2020-06-03 DIAGNOSIS — H11153 Pinguecula, bilateral: Secondary | ICD-10-CM | POA: Diagnosis not present

## 2020-06-03 DIAGNOSIS — H40013 Open angle with borderline findings, low risk, bilateral: Secondary | ICD-10-CM | POA: Diagnosis not present

## 2020-06-07 DIAGNOSIS — M79641 Pain in right hand: Secondary | ICD-10-CM | POA: Diagnosis not present

## 2020-09-08 ENCOUNTER — Encounter: Payer: Self-pay | Admitting: Cardiovascular Disease

## 2020-09-08 NOTE — Progress Notes (Signed)
Cardiology Office Note   Date:  09/09/2020   ID:  Lister, Brizzi June 09, 1946, MRN [id number]  PCP:  Lorene Dy, MD  Cardiologist:  Mertie Moores, MD   Chief Complaint  Patient presents with  . Coronary Artery Disease  . Hyperlipidemia   Problem list: 1. Coronary artery disease-status post 2.5 x 18 mm Promus stent positioned in the ramus intermediate vessel (10/29/08),  S/P CABG in July, 2020  2. Hyperlipidemia 3. RBBB   [name] is a 74 yo with a hx of CAD. He is exercising regularly. No angina  February 17, 2013: No angina. He had bilateral knee replacement. He is working out 3-4 days a week - 1-1/2 hour each time.   February 21, 2014:  Riding on the stationary bike regularly. Also boxing (speed bag).  No angina. He had pneumonia this past winter - total of 4 weeks of abx.   February 21, 2015:   [name]  is a 74 y.o. male who presents for his CAD Is working out regulary Still working part time.    February 27, 2016:  [name] is doing well. Boxing with the speed bag and the heavy bad Stationary bike, sit ups .  Weights.  No CP , no dyspnea.   February 26, 2017  [name] is doing well  BP is a bit elevated here.   [facility] at the [facility] - checks it regularly  No CP , no dyspnea.   Oct. 1, 2020   Had a cardiac arrest in June,  Cath showed 3 V cad,   Had CABG on 06/08/19. Doing well.  No CP  Finishing cardiac rehab  Oct. 4, 2021:  [name] is seen today for follow up of his CAD. He is s/p CABG ( following cardiac arrest in July 2020) for severe 3 V CAD. Still working out.  No CP , no dyspnea.     Past Medical History:  Diagnosis Date  . Arthritis    OA AND PAIN BOTH KNEES  . Asthma    AS A CHILD  . Chronic knee pain   . Coronary artery disease    a. PTCA/stenting ramus inter. vess.; 10/2008,  b. cardiac arrest 06/03/2019, CABG X3 LIMA-LAD, SVG-RI, SVG-Circ 06/08/2019  . GERD (gastroesophageal reflux disease)    MILD--WOULD TAKE ROLAID IF NEEDED  . Hernia      "AT MY BELLY BUTTON" - NO PAIN OR PROBLEMS  . Hyperlipidemia   . Hypertension   . Premature atrial contractions    Cardiac monitor 03/2020: NSR, rare PVCs, 3 beat run SVT, rare PVCs  . Restless leg syndrome   . Seasonal allergies    FREQENT BRONCHITIS. SINUS INFECTIONS WITH SEASON CHANGES  . Shingles DEC 2012   MILD CASE-NO RESIDUAL PROBLEMS  . Sleep apnea 07/06/12   STOP BANG SCORE OF 4    Past Surgical History:  Procedure Laterality Date  . CARDIAC CATHETERIZATION     10/30/2008;  PTCA/stent ramus interm. vess.  . CORONARY ANGIOPLASTY    . CORONARY ARTERY BYPASS GRAFT N/A 06/08/2019   Procedure: CORONARY ARTERY BYPASS GRAFTING (CABG), ON PUMP, TIMES THREE, USING LEFT INTERNAL MAMMARY ARTERY AND ENDOSCOPICALLY HARVESTED LEFT GREATER SAPHENOUS VEIN;  Surgeon: Ivin Poot, MD;  Location: [facility];  Service: Open Heart Surgery;  Laterality: N/A;  . CORONARY/GRAFT ACUTE MI REVASCULARIZATION N/A 06/04/2019   Procedure: Coronary/Graft Acute MI Revascularization;  Surgeon: Burnell Blanks, MD;  Location: [place] [facility];  Service: Cardiovascular;  Laterality: N/A;  .  JOINT REPLACEMENT    . KNEE SURGERY     right and left  . LEFT HEART CATH AND CORONARY ANGIOGRAPHY N/A 06/04/2019   Procedure: LEFT HEART CATH AND CORONARY ANGIOGRAPHY;  Surgeon: Burnell Blanks, MD;  Location: [place] [facility];  Service: Cardiovascular;  Laterality: N/A;  . TEE WITHOUT CARDIOVERSION N/A 06/08/2019   Procedure: TRANSESOPHAGEAL ECHOCARDIOGRAM (TEE);  Surgeon: Prescott Gum, Collier Salina, MD;  Location: [facility];  Service: Open Heart Surgery;  Laterality: N/A;  . TOTAL KNEE ARTHROPLASTY  07/26/2012   Procedure: TOTAL KNEE ARTHROPLASTY;  Surgeon: Mauri Pole, MD;  Location: [facility];  Service: Orthopedics;  Laterality: Left;  . TOTAL KNEE ARTHROPLASTY  10/25/2012   Procedure: TOTAL KNEE ARTHROPLASTY;  Surgeon: Mauri Pole, MD;  Location: [facility];  Service: Orthopedics;  Laterality: Right;     Current  Outpatient Medications  Medication Sig Dispense Refill  . amLODipine (NORVASC) 2.5 MG tablet Take 2.5 mg by mouth daily.    Marland Kitchen amoxicillin (AMOXIL) 500 MG capsule Take 4 capsules by mouth as needed. Take 1 hr prior to appt    . aspirin EC 81 MG tablet Take 81 mg by mouth every morning.    Marland Kitchen atorvastatin (LIPITOR) 80 MG tablet Take 80 mg by mouth daily.    . clopidogrel (PLAVIX) 75 MG tablet Take 1 tablet (75 mg total) by mouth every morning. 90 tablet 3  . diphenhydrAMINE (BENADRYL) 25 MG tablet Take 25 mg by mouth daily as needed for allergies.     . famotidine (PEPCID) 20 MG tablet Take 20 mg by mouth 2 (two) times daily.    . fluticasone (FLONASE) 50 MCG/ACT nasal spray Place 1 spray into both nostrils at bedtime.    . Fluticasone Propionate (FLONASE NA) Place 1 spray into the nose as needed (allergies). For sinus infection    . metoprolol tartrate (LOPRESSOR) 25 MG tablet TAKE 1 TABLET BY MOUTH TWICE A DAY 180 tablet 3  . nitroGLYCERIN (NITROSTAT) 0.4 MG SL tablet Place 1 tablet (0.4 mg total) under the tongue every 5 (five) minutes as needed for chest pain. 25 tablet 6  . vitamin B-12 (CYANOCOBALAMIN) 1000 MCG tablet Take 1,000 mcg by mouth daily.    Marland Kitchen zinc gluconate 50 MG tablet Take 25 mg by mouth daily.      No current facility-administered medications for this visit.    Allergies:   Clarithromycin    Social History:  The patient  reports that he has never smoked. He has quit using smokeless tobacco. He reports current alcohol use. He reports that he does not use drugs.   Family History:  The patient's family history includes CAD in his father; COPD in his father; Hypertension in his mother.    ROS:  Please see the history of present illness.     Physical Exam: Blood pressure 122/66, pulse 68, height 6\' 3"  (1.905 m), weight 218 lb 6.4 oz (99.1 kg), SpO2 96 %.  GEN:  Well nourished, well developed in no acute distress HEENT: Normal NECK: No JVD; No carotid  bruits LYMPHATICS: No lymphadenopathy CARDIAC: RRR , no murmurs, rubs, gallops RESPIRATORY:  Clear to auscultation without rales, wheezing or rhonchi  ABDOMEN: Soft, non-tender, non-distended MUSCULOSKELETAL:  No edema; No deformity  SKIN: Warm and dry NEUROLOGIC:  Alert and oriented x 3   EKG:     Recent Labs: No results found for requested labs within last 8760 hours.    Lipid Panel    Component Value  Date/Time   CHOL 144 08/07/2019 1050   TRIG 114 08/07/2019 1050   HDL 59 08/07/2019 1050   CHOLHDL 2.4 08/07/2019 1050   CHOLHDL 2.5 06/04/2019 1445   VLDL 23 06/04/2019 1445   LDLCALC 65 08/07/2019 1050      Wt Readings from Last 3 Encounters:  09/09/20 218 lb 6.4 oz (99.1 kg)  03/05/20 220 lb 1.9 oz (99.8 kg)  09/07/19 205 lb 12.8 oz (93.4 kg)      Other studies Reviewed: Additional studies/ records that were reviewed today include: . Review of the above records demonstrates:   ASSESSMENT AND PLAN:   1. Coronary artery disease-status post 2.5 x 18 mm Promus stent positioned in the ramus intermediate vessel (10/29/08)  S/p cardiac arrest in June, 2020 .   Cath showed 3 V CAD Had CABG 06/08/2019.   No angian.  BP and lipids have been stable    2. Hyperlipidemia -check lipids, bmp, liver enz today   3. RBBB -    Current medicines are reviewed at length with the patient today.  The patient does not have concerns regarding medicines.  The following changes have been made:  no change  Labs/ tests ordered today include:   Orders Placed This Encounter  Procedures  . Lipid Profile  . Basic Metabolic Panel (BMET)  . Hepatic function panel    Disposition:   FU with APP in 6 months .  I will see him in a year    Signed, Mertie Moores, MD  09/09/2020 8:54 AM    [facility] [place], [place], [place]  [address] Phone: [phone]; Fax: [phone]

## 2020-09-09 ENCOUNTER — Ambulatory Visit (INDEPENDENT_AMBULATORY_CARE_PROVIDER_SITE_OTHER): Payer: PPO | Admitting: Cardiovascular Disease

## 2020-09-09 ENCOUNTER — Other Ambulatory Visit: Payer: Self-pay

## 2020-09-09 ENCOUNTER — Encounter: Payer: Self-pay | Admitting: Cardiovascular Disease

## 2020-09-09 VITALS — BP 122/66 | HR 68 | Ht 75.0 in | Wt 218.4 lb

## 2020-09-09 DIAGNOSIS — I10 Essential (primary) hypertension: Secondary | ICD-10-CM | POA: Diagnosis not present

## 2020-09-09 DIAGNOSIS — I469 Cardiac arrest, cause unspecified: Secondary | ICD-10-CM

## 2020-09-09 DIAGNOSIS — I2510 Atherosclerotic heart disease of native coronary artery without angina pectoris: Secondary | ICD-10-CM

## 2020-09-09 DIAGNOSIS — I491 Atrial premature depolarization: Secondary | ICD-10-CM | POA: Diagnosis not present

## 2020-09-09 LAB — BASIC METABOLIC PANEL
BUN/Creatinine Ratio: 20 (ref 10–24)
BUN: 18 mg/dL (ref 8–27)
CO2: 23 mmol/L (ref 20–29)
Calcium: 9.1 mg/dL (ref 8.6–10.2)
Chloride: 105 mmol/L (ref 96–106)
Creatinine, Ser: 0.91 mg/dL (ref 0.76–1.27)
GFR calc Af Amer: 95 mL/min/{1.73_m2} (ref >59)
GFR calc non Af Amer: 82 mL/min/{1.73_m2} (ref >59)
Glucose: 111 mg/dL — ABNORMAL HIGH (ref 65–99)
Potassium: 4.1 mmol/L (ref 3.5–5.2)
Sodium: 138 mmol/L (ref 134–144)

## 2020-09-09 LAB — HEPATIC FUNCTION PANEL
ALT: 41 IU/L (ref 0–44)
AST: 39 IU/L (ref 0–40)
Albumin: 4.2 g/dL (ref 3.7–4.7)
Alkaline Phosphatase: 60 IU/L (ref 44–121)
Bilirubin Total: 0.6 mg/dL (ref 0.0–1.2)
Bilirubin, Direct: 0.17 mg/dL (ref 0.00–0.40)
Total Protein: 6.2 g/dL (ref 6.0–8.5)

## 2020-09-09 LAB — LIPID PANEL
Chol/HDL Ratio: 2.4 ratio (ref 0.0–5.0)
Cholesterol, Total: 136 mg/dL (ref 100–199)
HDL: 57 mg/dL (ref >39)
LDL Chol Calc (NIH): 58 mg/dL (ref 0–99)
Triglycerides: 115 mg/dL (ref 0–149)
VLDL Cholesterol Cal: 21 mg/dL (ref 5–40)

## 2020-09-09 NOTE — Patient Instructions (Signed)
Medication Instructions:  Your physician recommends that you continue on your current medications as directed. Please refer to the Current Medication list given to you today.  *If you need a refill on your cardiac medications before your next appointment, please call your pharmacy*   Lab Work: TODAY - cholesterol, liver panel, basic metabolic panel If you have labs (blood work) drawn today and your tests are completely normal, you will receive your results only by: Marland Kitchen MyChart Message (if you have MyChart) OR . A paper copy in the mail If you have any lab test that is abnormal or we need to change your treatment, we will call you to review the results.   Testing/Procedures: None Ordered    Follow-Up: At [facility], you and your health needs are our priority.  As part of our continuing mission to provide you with exceptional heart care, we have created designated Provider Care Teams.  These Care Teams include your primary Cardiologist (physician) and Advanced Practice Providers (APPs -  Physician Assistants and Nurse Practitioners) who all work together to provide you with the care you need, when you need it.   Your next appointment:   6 month(s)  The format for your next appointment:   In Person  Provider:   Richardson Dopp, PA-C

## 2020-11-25 ENCOUNTER — Other Ambulatory Visit: Payer: Self-pay | Admitting: Cardiovascular Disease

## 2020-11-26 NOTE — Telephone Encounter (Signed)
[facility] is calling to check on the status of this prescription due to only having 5 tablets left and taking 2 tablets daily. Please advise.

## 2020-12-24 DIAGNOSIS — J209 Acute bronchitis, unspecified: Secondary | ICD-10-CM | POA: Diagnosis not present

## 2020-12-24 DIAGNOSIS — I10 Essential (primary) hypertension: Secondary | ICD-10-CM | POA: Diagnosis not present

## 2021-01-08 DIAGNOSIS — J209 Acute bronchitis, unspecified: Secondary | ICD-10-CM | POA: Diagnosis not present

## 2021-01-08 DIAGNOSIS — I10 Essential (primary) hypertension: Secondary | ICD-10-CM | POA: Diagnosis not present

## 2021-01-12 IMAGING — CT CT ANGIOGRAPHY CHEST
1 of 6 series · 4 of 16 positions shown · IV contrast (omnipaque)
Comparison: Same day chest x-ray

CLINICAL DATA: Dizziness, hypoxia

EXAM:
CT ANGIOGRAPHY CHEST WITH CONTRAST
TECHNIQUE: Multidetector CT imaging of the chest was performed using the
standard protocol during bolus administration of intravenous
contrast. Multiplanar CT image reconstructions and MIPs were
obtained to evaluate the vascular anatomy.
CONTRAST:  100mL OMNIPAQUE IOHEXOL 350 MG/ML SOLN

[Series 8: pe thins · axial · 0.84mm/px · z∈[+157,+336]mm · 4 of 426 slices shown]
[im 86/426  lung]
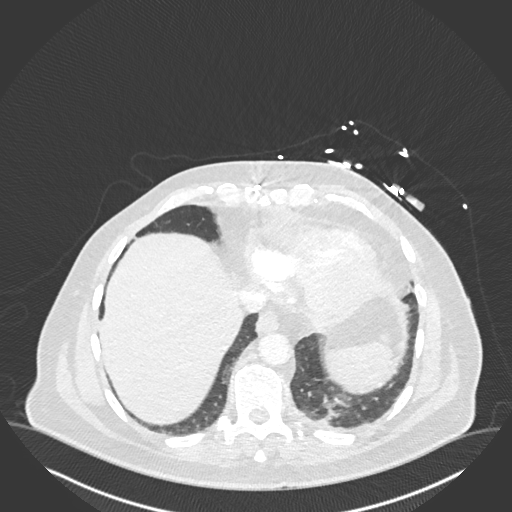
[im 171/426  soft-tissue]
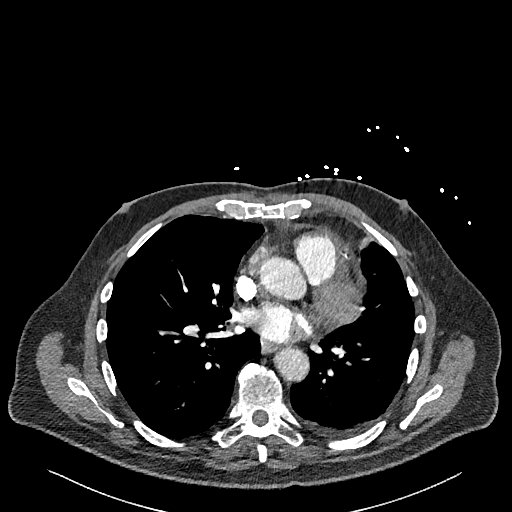
[im 256/426  lung]
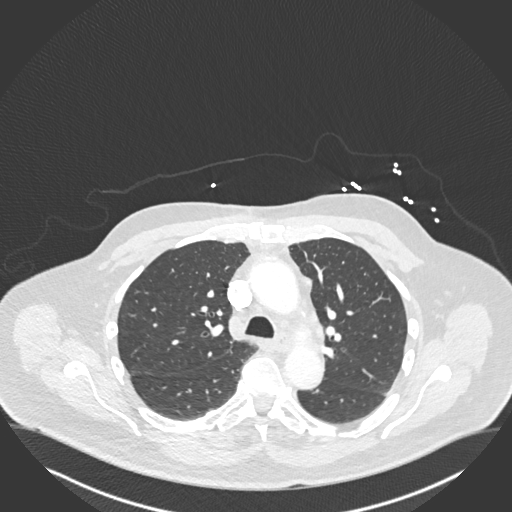
[im 341/426  soft-tissue]
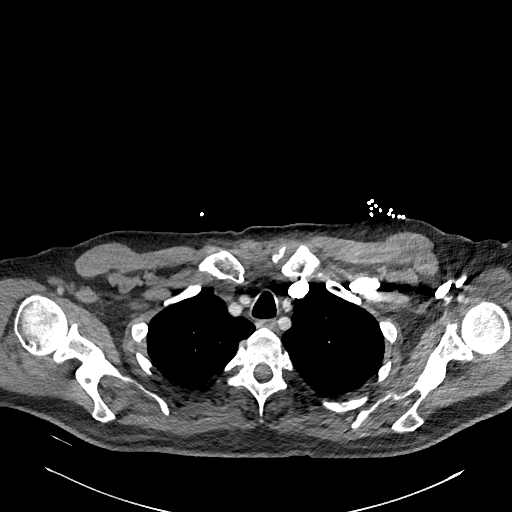

[4 of 16 positions shown; findings below may reference images not displayed]

FINDINGS: Cardiovascular: Postsurgical changes from CABG. Heart size is within
normal limits. No significant pericardial effusion. Thoracic aorta
is nonaneurysmal. Pulmonary vasculature is adequately opacified. No
filling defects to suggest pulmonary embolism.

Mediastinum/Nodes: No enlarged mediastinal, hilar, or axillary lymph
nodes. Thyroid gland, trachea, and esophagus demonstrate no
significant findings. Incidental note of calcified 9 mm nodule in
left thyroid lobe, which does not are dedicated imaging.

Lungs/Pleura: Trace left pleural effusion with mild compressive
atelectasis. Lungs are otherwise clear. No right-sided pleural
effusion. No pneumothorax.

Upper Abdomen: Small sliding-type hiatal hernia. Partially
visualized rounded 5.0 cm fluid density structure within the left
upper quadrant, incompletely characterized. Possibly this represents
a portion of the left renal cyst. There are no acute findings within
the upper abdomen.

Musculoskeletal: No chest wall abnormality. No acute or significant
osseous findings.

Review of the MIP images confirms the above findings.
IMPRESSION: 1. Negative for pulmonary embolism.
2. Trace left pleural effusion.  Lungs otherwise clear.
3. Small hiatal hernia.
4. Partially visualized round 5.0 cm fluid density structure in the
left upper abdomen, possibly reflecting a portion of a left renal
cyst. Consider nonemergent renal ultrasound for further
characterization.

## 2021-01-27 ENCOUNTER — Telehealth: Payer: Self-pay | Admitting: Cardiovascular Disease

## 2021-01-27 NOTE — Telephone Encounter (Signed)
Returned call to Pt.  Advised to follow up with PCP.  Pt indicates understanding.

## 2021-01-27 NOTE — Telephone Encounter (Signed)
I recommend that he see his primary md

## 2021-01-27 NOTE — Telephone Encounter (Signed)
New message:     Patient would like for a nurse to call him concerning some medication and he is having a bad cough.

## 2021-01-27 NOTE — Telephone Encounter (Signed)
Call received from Pt.  Per Pt he went to see his PCP 3-4 weeks ago for a cough.  Was diagnosed with right side Bronchitis and prescribed antibiotics and hydrocodone (to help with sleep).  After the antibiotic he still did not feel better, had continued cough.  Pt states his cough is sporadic and mainly a dry cough.  He does have some clear white mucus from time to time.  Per Pt he had been told by a cardiac PA previously that he may have silent reflux.  He describes most coughing at night.  Pt will continue to follow with PCP but he is asking if Dr. Acie Fredrickson has any suggestion for treatment.

## 2021-01-28 ENCOUNTER — Ambulatory Visit
Admission: RE | Admit: 2021-01-28 | Discharge: 2021-01-28 | Disposition: A | Discharge status: Home / Self Care | Payer: PPO | Source: Ambulatory Visit | Attending: Internal Medicine | Admitting: Internal Medicine

## 2021-01-28 ENCOUNTER — Other Ambulatory Visit: Payer: Self-pay

## 2021-01-28 ENCOUNTER — Other Ambulatory Visit: Payer: Self-pay | Admitting: Internal Medicine

## 2021-01-28 DIAGNOSIS — R059 Cough, unspecified: Secondary | ICD-10-CM | POA: Diagnosis not present

## 2021-01-28 DIAGNOSIS — I10 Essential (primary) hypertension: Secondary | ICD-10-CM | POA: Diagnosis not present

## 2021-01-28 DIAGNOSIS — J209 Acute bronchitis, unspecified: Secondary | ICD-10-CM | POA: Diagnosis not present

## 2021-02-23 ENCOUNTER — Other Ambulatory Visit: Payer: Self-pay | Admitting: Cardiovascular Disease

## 2021-03-07 NOTE — Progress Notes (Signed)
Cardiology Office Note:    Date:  03/11/2021   ID:  CALE BETHARD, DOB 1946-01-20, MRN [id number]  PCP:  Lorene Dy, [place]  Cardiologist:  Mertie Moores, MD   Advanced Practice Provider:  Liliane Shi, PA-C Electrophysiologist:  None       Referring MD: Lorene Dy, MD   Chief Complaint:  Follow-up (CAD)    Patient Profile:     [name] is a 75 y.o. male with:   Coronary artery disease  ? S/p DES to RI in 2009 ? Myoview in 2011: neg for ischemia ? S/p non-STEMI/cardiac arrest 05/2019 >> s/p CABG  Hypertension   Hyperlipidemia   GERD  OSA  RBBB  Prior CV studies: LONG TERM MONITOR (3-7 DAYS) INTERPRETATION 03/22/2020 Narrative  Sinus rhythm  Rare PACs . There was a 3 beat run of SVT  Rare PVCs  Echocardiogram 06/05/2019 Ant-sept HK, EF 50-55, mild asymmetric LVH, Gr 2 DD, normal RVSF   Cardiac catheterization 06/04/2019 LAD prox 99 RI stent ok, then 70 LCx mid 92 EF 45-50   Nuclear stress test12/1/11  No ischemia, EF 62, normal study  Cardiac catheterization 10/29/08 LAD ostial 30 RI mid 90 LCx irregularities RCA patent EF 65 PCI: 2.5 x 18 mm Promus DES to theRI      History of Present Illness:    Mr. Angus was last seen by Dr. Acie Fredrickson in 10/21.  He returns for follow-up.  He remains active.  He works out 3 times a week.  He denies chest pain, shortness of breath, syncope, orthopnea, PND or significant pedal edema.  He has a chronic AM cough that has improved with taking antacids and guaifenesin.           Past Medical History:  Diagnosis Date  . Arthritis    OA AND PAIN BOTH KNEES  . Asthma    AS A CHILD  . Chronic knee pain   . Coronary artery disease    a. PTCA/stenting ramus inter. vess.; 10/2008,  b. cardiac arrest 06/03/2019, CABG X3 LIMA-LAD, SVG-RI, SVG-Circ 06/08/2019  . GERD (gastroesophageal reflux disease)    MILD--WOULD TAKE ROLAID IF NEEDED  . Hernia    "AT MY BELLY  BUTTON" - NO PAIN OR PROBLEMS  . Hyperlipidemia   . Hypertension   . Premature atrial contractions    Cardiac monitor 03/2020: NSR, rare PVCs, 3 beat run SVT, rare PVCs  . Restless leg syndrome   . Seasonal allergies    FREQENT BRONCHITIS. SINUS INFECTIONS WITH SEASON CHANGES  . Shingles DEC 2012   MILD CASE-NO RESIDUAL PROBLEMS  . Sleep apnea 07/06/12   STOP BANG SCORE OF 4    Current Medications: Current Meds  Medication Sig  . amLODipine (NORVASC) 2.5 MG tablet Take 2.5 mg by mouth daily.  Marland Kitchen amoxicillin (AMOXIL) 500 MG capsule Take 4 capsules by mouth as needed. Take 1 hr prior to appt  . aspirin EC 81 MG tablet Take 81 mg by mouth every morning.  Marland Kitchen atorvastatin (LIPITOR) 80 MG tablet Take 80 mg by mouth daily.  . clopidogrel (PLAVIX) 75 MG tablet TAKE 1 TABLET BY MOUTH EVERY DAY IN THE MORNING  . diphenhydrAMINE (BENADRYL) 25 MG tablet Take 25 mg by mouth daily as needed for allergies.   . famotidine (PEPCID) 20 MG tablet Take 20 mg by mouth 2 (two) times daily.  . fluticasone (FLONASE) 50 MCG/ACT nasal spray Place 1 spray into both nostrils  at bedtime.  . Fluticasone Propionate (FLONASE NA) Place 1 spray into the nose as needed (allergies). For sinus infection  . metoprolol tartrate (LOPRESSOR) 25 MG tablet TAKE 1 TABLET BY MOUTH TWICE A DAY  . vitamin B-12 (CYANOCOBALAMIN) 1000 MCG tablet Take 1,000 mcg by mouth daily.  Marland Kitchen zinc gluconate 50 MG tablet Take 25 mg by mouth daily.      Allergies:   Clarithromycin   Social History   Tobacco Use  . Smoking status: Never Smoker  . Smokeless tobacco: Former Network engineer  . Vaping Use: Never used  Substance Use Topics  . Alcohol use: Yes    Comment: occ  . Drug use: No     Family Hx: The patient's family history includes CAD in his father; COPD in his father; Hypertension in his mother.  Review of Systems  Respiratory: Positive for cough.      EKGs/Labs/Other Test Reviewed:    EKG:  EKG is   ordered today.  The  ekg ordered today demonstrates NSR, HR 73, normal axis, right bundle branch block, QTC 447, no change from prior tracing  Recent Labs: 09/09/2020: ALT 41; BUN 18; Creatinine, Ser 0.91; Potassium 4.1; Sodium 138   Recent Lipid Panel Lab Results  Component Value Date/Time   CHOL 136 09/09/2020 08:56 AM   TRIG 115 09/09/2020 08:56 AM   HDL 57 09/09/2020 08:56 AM   CHOLHDL 2.4 09/09/2020 08:56 AM   CHOLHDL 2.5 06/04/2019 02:45 PM   LDLCALC 58 09/09/2020 08:56 AM      Risk Assessment/Calculations:      Physical Exam:    VS:  BP 140/70   Pulse 73   Ht 6\' 3"  (1.905 m)   Wt 219 lb (99.3 kg)   SpO2 98%   BMI 27.37 kg/m     Wt Readings from Last 3 Encounters:  03/11/21 219 lb (99.3 kg)  09/09/20 218 lb 6.4 oz (99.1 kg)  03/05/20 220 lb 1.9 oz (99.8 kg)     Constitutional:      Appearance: Healthy appearance. Not in distress.  Neck:     Vascular: No carotid bruit or JVR. JVD normal.  Pulmonary:     Effort: Pulmonary effort is normal.     Breath sounds: No wheezing. No rales.  Cardiovascular:     Normal rate. Regular rhythm. Normal S1. Normal S2.     Murmurs: There is no murmur.  Edema:    Peripheral edema absent.  Abdominal:     Palpations: Abdomen is soft. There is no hepatomegaly.  Skin:    General: Skin is warm and dry.  Neurological:     General: No focal deficit present.     Mental Status: Alert and oriented to person, place and time.     Cranial Nerves: Cranial nerves are intact.         ASSESSMENT & PLAN:    1. Coronary artery disease involving native coronary artery of native heart without angina pectoris History of PCI with DES to the ramus intermediate in 2009.  He suffered a non-STEMI and cardiac arrest in June 2020 followed by CABG.   he is doing well without anginal symptoms.  He remains on dual antiplatelet therapy with aspirin and clopidogrel.  I will review further with Dr. Acie Fredrickson whether or not we can stop his clopidogrel now.  Continue metoprolol,  atorvastatin.  Follow-up 6 months.  2. Essential (primary) hypertension Blood pressures at home are optimal.  They tend to run higher  in the office.  He has not taken medications yet today.  Continue current dose of amlodipine, metoprolol tartrate.  3. Mixed hyperlipidemia LDL optimal on most recent lab work.  Continue current Rx.      Dispo:  Return in about 6 months (around 09/10/2021) for Routine Follow Up, w/ Dr. Acie Fredrickson, or Richardson Dopp, PA-C, in person.   Medication Adjustments/Labs and Tests Ordered: Current medicines are reviewed at length with the patient today.  Concerns regarding medicines are outlined above.  Tests Ordered: Orders Placed This Encounter  Procedures  . EKG 12-Lead   Medication Changes: No orders of the defined types were placed in this encounter.   Signed, Richardson Dopp, PA-C  03/11/2021 8:56 AM    [facility] [place], [place], [place]  [address] Phone: [phone]; Fax: [phone]

## 2021-03-11 ENCOUNTER — Encounter: Payer: Self-pay | Admitting: Physician Assistant

## 2021-03-11 ENCOUNTER — Other Ambulatory Visit: Payer: Self-pay

## 2021-03-11 ENCOUNTER — Ambulatory Visit: Payer: PPO | Admitting: Physician Assistant

## 2021-03-11 ENCOUNTER — Telehealth: Payer: Self-pay | Admitting: *Deleted

## 2021-03-11 VITALS — BP 140/70 | HR 73 | Ht 75.0 in | Wt 219.0 lb

## 2021-03-11 DIAGNOSIS — I2510 Atherosclerotic heart disease of native coronary artery without angina pectoris: Secondary | ICD-10-CM

## 2021-03-11 DIAGNOSIS — I10 Essential (primary) hypertension: Secondary | ICD-10-CM

## 2021-03-11 DIAGNOSIS — E782 Mixed hyperlipidemia: Secondary | ICD-10-CM | POA: Diagnosis not present

## 2021-03-11 NOTE — Telephone Encounter (Signed)
Faxed EKG to PCP @ [phone]

## 2021-03-11 NOTE — Patient Instructions (Signed)
Medication Instructions:  Your physician recommends that you continue on your current medications as directed. Please refer to the Current Medication list given to you today.  *If you need a refill on your cardiac medications before your next appointment, please call your pharmacy*   Lab Work: None   If you have labs (blood work) drawn today and your tests are completely normal, you will receive your results only by: Marland Kitchen MyChart Message (if you have MyChart) OR . A paper copy in the mail If you have any lab test that is abnormal or we need to change your treatment, we will call you to review the results.   Testing/Procedures: None   Follow-Up: At [facility], you and your health needs are our priority.  As part of our continuing mission to provide you with exceptional heart care, we have created designated Provider Care Teams.  These Care Teams include your primary Cardiologist (physician) and Advanced Practice Providers (APPs -  Physician Assistants and Nurse Practitioners) who all work together to provide you with the care you need, when you need it.  We recommend signing up for the patient portal called "MyChart".  Sign up information is provided on this After Visit Summary.  MyChart is used to connect with patients for Virtual Visits (Telemedicine).  Patients are able to view lab/test results, encounter notes, upcoming appointments, etc.  Non-urgent messages can be sent to your provider as well.   To learn more about what you can do with MyChart, go to [email or web address].    Your next appointment:   6 month(s)  The format for your next appointment:   In Person  Provider:   Mertie Moores, MD

## 2021-04-21 DIAGNOSIS — Z79899 Other long term (current) drug therapy: Secondary | ICD-10-CM | POA: Diagnosis not present

## 2021-04-21 DIAGNOSIS — I10 Essential (primary) hypertension: Secondary | ICD-10-CM | POA: Diagnosis not present

## 2021-04-21 DIAGNOSIS — E7800 Pure hypercholesterolemia, unspecified: Secondary | ICD-10-CM | POA: Diagnosis not present

## 2021-04-21 DIAGNOSIS — Z7289 Other problems related to lifestyle: Secondary | ICD-10-CM | POA: Diagnosis not present

## 2021-04-21 DIAGNOSIS — E785 Hyperlipidemia, unspecified: Secondary | ICD-10-CM | POA: Diagnosis not present

## 2021-04-21 DIAGNOSIS — H9113 Presbycusis, bilateral: Secondary | ICD-10-CM | POA: Diagnosis not present

## 2021-04-21 DIAGNOSIS — D485 Neoplasm of uncertain behavior of skin: Secondary | ICD-10-CM | POA: Diagnosis not present

## 2021-04-21 DIAGNOSIS — R002 Palpitations: Secondary | ICD-10-CM | POA: Diagnosis not present

## 2021-04-21 DIAGNOSIS — Z1211 Encounter for screening for malignant neoplasm of colon: Secondary | ICD-10-CM | POA: Diagnosis not present

## 2021-04-21 DIAGNOSIS — I70219 Atherosclerosis of native arteries of extremities with intermittent claudication, unspecified extremity: Secondary | ICD-10-CM | POA: Diagnosis not present

## 2021-04-21 DIAGNOSIS — Z0000 Encounter for general adult medical examination without abnormal findings: Secondary | ICD-10-CM | POA: Diagnosis not present

## 2021-04-21 DIAGNOSIS — R5383 Other fatigue: Secondary | ICD-10-CM | POA: Diagnosis not present

## 2021-04-21 DIAGNOSIS — R42 Dizziness and giddiness: Secondary | ICD-10-CM | POA: Diagnosis not present

## 2021-04-21 DIAGNOSIS — Z1389 Encounter for screening for other disorder: Secondary | ICD-10-CM | POA: Diagnosis not present

## 2021-04-21 DIAGNOSIS — E039 Hypothyroidism, unspecified: Secondary | ICD-10-CM | POA: Diagnosis not present

## 2021-04-21 DIAGNOSIS — Z131 Encounter for screening for diabetes mellitus: Secondary | ICD-10-CM | POA: Diagnosis not present

## 2021-04-21 DIAGNOSIS — R1032 Left lower quadrant pain: Secondary | ICD-10-CM | POA: Diagnosis not present

## 2021-04-21 DIAGNOSIS — Z1331 Encounter for screening for depression: Secondary | ICD-10-CM | POA: Diagnosis not present

## 2021-04-21 DIAGNOSIS — Z125 Encounter for screening for malignant neoplasm of prostate: Secondary | ICD-10-CM | POA: Diagnosis not present

## 2021-05-27 DIAGNOSIS — J209 Acute bronchitis, unspecified: Secondary | ICD-10-CM | POA: Diagnosis not present

## 2021-05-27 DIAGNOSIS — I10 Essential (primary) hypertension: Secondary | ICD-10-CM | POA: Diagnosis not present

## 2021-06-04 DIAGNOSIS — H40013 Open angle with borderline findings, low risk, bilateral: Secondary | ICD-10-CM | POA: Diagnosis not present

## 2021-06-04 DIAGNOSIS — H2513 Age-related nuclear cataract, bilateral: Secondary | ICD-10-CM | POA: Diagnosis not present

## 2021-06-04 DIAGNOSIS — H11153 Pinguecula, bilateral: Secondary | ICD-10-CM | POA: Diagnosis not present

## 2021-06-10 DIAGNOSIS — J209 Acute bronchitis, unspecified: Secondary | ICD-10-CM | POA: Diagnosis not present

## 2021-06-10 DIAGNOSIS — I10 Essential (primary) hypertension: Secondary | ICD-10-CM | POA: Diagnosis not present

## 2021-06-15 ENCOUNTER — Other Ambulatory Visit: Payer: Self-pay | Admitting: Physician Assistant

## 2021-08-29 ENCOUNTER — Other Ambulatory Visit: Payer: Self-pay

## 2021-08-29 ENCOUNTER — Encounter: Payer: Self-pay | Admitting: Cardiovascular Disease

## 2021-08-29 ENCOUNTER — Ambulatory Visit: Payer: PPO | Admitting: Cardiovascular Disease

## 2021-08-29 VITALS — BP 135/80 | HR 66 | Ht 75.0 in | Wt 217.0 lb

## 2021-08-29 DIAGNOSIS — E782 Mixed hyperlipidemia: Secondary | ICD-10-CM

## 2021-08-29 DIAGNOSIS — I2510 Atherosclerotic heart disease of native coronary artery without angina pectoris: Secondary | ICD-10-CM

## 2021-08-29 DIAGNOSIS — I10 Essential (primary) hypertension: Secondary | ICD-10-CM | POA: Diagnosis not present

## 2021-08-29 LAB — BASIC METABOLIC PANEL
BUN/Creatinine Ratio: 20 (ref 10–24)
BUN: 19 mg/dL (ref 8–27)
CO2: 22 mmol/L (ref 20–29)
Calcium: 9.4 mg/dL (ref 8.6–10.2)
Chloride: 101 mmol/L (ref 96–106)
Creatinine, Ser: 0.96 mg/dL (ref 0.76–1.27)
Glucose: 100 mg/dL — ABNORMAL HIGH (ref 65–99)
Potassium: 4.4 mmol/L (ref 3.5–5.2)
Sodium: 137 mmol/L (ref 134–144)
eGFR: 82 mL/min/{1.73_m2} (ref >59)

## 2021-08-29 LAB — LIPID PANEL
Chol/HDL Ratio: 2.4 ratio (ref 0.0–5.0)
Cholesterol, Total: 149 mg/dL (ref 100–199)
HDL: 63 mg/dL (ref >39)
LDL Chol Calc (NIH): 63 mg/dL (ref 0–99)
Triglycerides: 133 mg/dL (ref 0–149)
VLDL Cholesterol Cal: 23 mg/dL (ref 5–40)

## 2021-08-29 LAB — HEPATIC FUNCTION PANEL
ALT: 66 IU/L — ABNORMAL HIGH (ref 0–44)
AST: 57 IU/L — ABNORMAL HIGH (ref 0–40)
Albumin: 4.4 g/dL (ref 3.7–4.7)
Alkaline Phosphatase: 79 IU/L (ref 44–121)
Bilirubin Total: 0.7 mg/dL (ref 0.0–1.2)
Bilirubin, Direct: 0.18 mg/dL (ref 0.00–0.40)
Total Protein: 6.6 g/dL (ref 6.0–8.5)

## 2021-08-29 NOTE — Progress Notes (Signed)
Cardiology Office Note   Date:  08/29/2021   ID:  Amirr, Achord [birth year], MRN [id number]  PCP:  Lorene Dy, MD  Cardiologist:  Mertie Moores, MD   Chief Complaint  Patient presents with   Coronary Artery Disease    Problem list: 1. Coronary artery disease-status post 2.5 x 18 mm Promus stent positioned in the ramus intermediate vessel (10/29/08),  S/P CABG in July, 2020  2. Hyperlipidemia 3. RBBB    [name] is a 75 yo with a hx of CAD.  He is exercising regularly.  No angina  February 17, 2013: No angina.  He had bilateral knee replacement.  He is working out 3-4 days a week - 1-1/2 hour each time.     February 21, 2014:  Riding on the stationary bike regularly.  Also boxing (speed bag).  No angina.  He had pneumonia this past winter - total of 4 weeks of abx.   February 21, 2015:   [name]  is a 75 y.o. male who presents for his CAD Is working out regulary Still working part time.    February 27, 2016:  [name] is doing well. Boxing with the speed bag and the heavy bad Stationary bike, sit ups .  Weights.  No CP , no dyspnea.   February 26, 2017  [name] is doing well  BP is a bit elevated here.   Its [facility] at the [facility] - checks it regularly  No CP , no dyspnea.   Oct. 1, 2020   Had a cardiac arrest in June,  Cath showed 3 V cad,   Had CABG on 06/08/19. Doing well.  No CP  Finishing cardiac rehab  Oct. 4, 2021:  [name] is seen today for follow up of his CAD. He is s/p CABG ( following cardiac arrest in July 2020) for severe 3 V CAD. Still working out.  No CP , no dyspnea.    Sept. 23, 2022: [name] is seen for follow up of his CAD Hx of CABG in 2020  Working out regularly     Past Medical History:  Diagnosis Date   Arthritis    OA AND PAIN BOTH KNEES   Asthma    AS A CHILD   Chronic knee pain    Coronary artery disease    a. PTCA/stenting ramus inter. vess.; 10/2008,  b. cardiac arrest 06/03/2019, CABG X3 LIMA-LAD, SVG-RI, SVG-Circ 06/08/2019   GERD  (gastroesophageal reflux disease)    MILD--WOULD TAKE ROLAID IF NEEDED   Hernia    "AT MY BELLY BUTTON" - NO PAIN OR PROBLEMS   Hyperlipidemia    Hypertension    Premature atrial contractions    Cardiac monitor 03/2020: NSR, rare PVCs, 3 beat run SVT, rare PVCs   Restless leg syndrome    Seasonal allergies    FREQENT BRONCHITIS. SINUS INFECTIONS WITH SEASON CHANGES   Shingles DEC 2012   MILD CASE-NO RESIDUAL PROBLEMS   Sleep apnea 07/06/12   STOP BANG SCORE OF 4    Past Surgical History:  Procedure Laterality Date   CARDIAC CATHETERIZATION     10/30/2008;  PTCA/stent ramus interm. vess.   CORONARY ANGIOPLASTY     CORONARY ARTERY BYPASS GRAFT N/A 06/08/2019   Procedure: CORONARY ARTERY BYPASS GRAFTING (CABG), ON PUMP, TIMES THREE, USING LEFT INTERNAL MAMMARY ARTERY AND ENDOSCOPICALLY HARVESTED LEFT GREATER SAPHENOUS VEIN;  Surgeon: Ivin Poot, MD;  Location: [facility];  Service: Open Heart Surgery;  Laterality: N/A;   CORONARY/GRAFT ACUTE  MI REVASCULARIZATION N/A 06/04/2019   Procedure: Coronary/Graft Acute MI Revascularization;  Surgeon: Burnell Blanks, MD;  Location: [facility];  Service: Cardiovascular;  Laterality: N/A;   JOINT REPLACEMENT     KNEE SURGERY     right and left   LEFT HEART CATH AND CORONARY ANGIOGRAPHY N/A 06/04/2019   Procedure: LEFT HEART CATH AND CORONARY ANGIOGRAPHY;  Surgeon: Burnell Blanks, MD;  Location: [facility];  Service: Cardiovascular;  Laterality: N/A;   TEE WITHOUT CARDIOVERSION N/A 06/08/2019   Procedure: TRANSESOPHAGEAL ECHOCARDIOGRAM (TEE);  Surgeon: Prescott Gum, Collier Salina, MD;  Location: [facility];  Service: Open Heart Surgery;  Laterality: N/A;   TOTAL KNEE ARTHROPLASTY  07/26/2012   Procedure: TOTAL KNEE ARTHROPLASTY;  Surgeon: Mauri Pole, MD;  Location: [facility];  Service: Orthopedics;  Laterality: Left;   TOTAL KNEE ARTHROPLASTY  10/25/2012   Procedure: TOTAL KNEE ARTHROPLASTY;  Surgeon: Mauri Pole, MD;  Location: [facility];  Service: Orthopedics;  Laterality: Right;     Current Outpatient Medications  Medication Sig Dispense Refill   amLODipine (NORVASC) 2.5 MG tablet Take 2.5 mg by mouth daily.     amoxicillin (AMOXIL) 500 MG capsule Take 4 capsules by mouth as needed. Take 1 hr prior to appt     aspirin EC 81 MG tablet Take 81 mg by mouth every morning.     atorvastatin (LIPITOR) 80 MG tablet Take 80 mg by mouth daily.     clopidogrel (PLAVIX) 75 MG tablet TAKE 1 TABLET BY MOUTH EVERY DAY IN THE MORNING 90 tablet 3   diphenhydrAMINE (BENADRYL) 25 MG tablet Take 25 mg by mouth daily as needed for allergies.      famotidine (PEPCID) 20 MG tablet Take 20 mg by mouth 2 (two) times daily.     Fluticasone Propionate (FLONASE NA) Place 1 spray into the nose as needed (allergies). For sinus infection     metoprolol tartrate (LOPRESSOR) 25 MG tablet TAKE 1 TABLET BY MOUTH TWICE A DAY 180 tablet 3   nitroGLYCERIN (NITROSTAT) 0.4 MG SL tablet Place 1 tablet (0.4 mg total) under the tongue every 5 (five) minutes as needed for chest pain. 25 tablet 3   vitamin B-12 (CYANOCOBALAMIN) 1000 MCG tablet Take 1,000 mcg by mouth daily.     zinc gluconate 50 MG tablet Take 25 mg by mouth daily.      No current facility-administered medications for this visit.    Allergies:   Clarithromycin    Social History:  The patient  reports that he has never smoked. He has quit using smokeless tobacco. He reports current alcohol use. He reports that he does not use drugs.   Family History:  The patient's family history includes CAD in his father; COPD in his father; Hypertension in his mother.    ROS:  Please see the history of present illness.    Physical Exam: Blood pressure 135/80, pulse 66, height 6\' 3"  (1.905 m), weight 217 lb (98.4 kg), SpO2 99 %.  GEN:  Well nourished, well developed in no acute distress HEENT: Normal NECK: No JVD; No carotid bruits LYMPHATICS: No lymphadenopathy CARDIAC: RRR , no murmurs, rubs,  gallops RESPIRATORY:  Clear to auscultation without rales, wheezing or rhonchi  ABDOMEN: Soft, non-tender, non-distended MUSCULOSKELETAL:  No edema; No deformity  SKIN: Warm and dry NEUROLOGIC:  Alert and oriented x 3   EKG:     Recent Labs: 08/29/2021: ALT 66; BUN 19; Creatinine, Ser 0.96; Potassium 4.4;  Sodium 137    Lipid Panel    Component Value Date/Time   CHOL 149 08/29/2021 0956   TRIG 133 08/29/2021 0956   HDL 63 08/29/2021 0956   CHOLHDL 2.4 08/29/2021 0956   CHOLHDL 2.5 06/04/2019 1445   VLDL 23 06/04/2019 1445   LDLCALC 63 08/29/2021 0956      Wt Readings from Last 3 Encounters:  08/29/21 217 lb (98.4 kg)  03/11/21 219 lb (99.3 kg)  09/09/20 218 lb 6.4 oz (99.1 kg)      Other studies Reviewed: Additional studies/ records that were reviewed today include: . Review of the above records demonstrates:   ASSESSMENT AND PLAN:   1. Coronary artery disease-status post 2.5 x 18 mm Promus stent positioned in the ramus intermediate vessel (10/29/08)  S/p cardiac arrest in June, 2020 .   Cath showed 3 V CAD Had CABG 06/08/2019.    He is working out regularly.  He is not having any episodes of angina.      2. Hyperlipidemia -lipids have been well controlled.  Continue atorvastatin.  We will check lipids, ALT, basic metabolic profile today.  3. RBBB -stable.  Current medicines are reviewed at length with the patient today.  The patient does not have concerns regarding medicines.  The following changes have been made:  no change  Labs/ tests ordered today include:   Orders Placed This Encounter  Procedures   Lipid Profile   Basic Metabolic Panel (BMET)   Hepatic function panel      Disposition:   FU with APP in 6 months .  I will see him in a year    Signed, Mertie Moores, MD  08/29/2021 5:46 PM    [facility] [place], [place], [place]  [address] Phone: [phone]; Fax: [phone]

## 2021-08-29 NOTE — Patient Instructions (Signed)
Medication Instructions:  Your physician recommends that you continue on your current medications as directed. Please refer to the Current Medication list given to you today.  *If you need a refill on your cardiac medications before your next appointment, please call your pharmacy*   Lab Work: TODAY - lipid panel, liver panel, basic metabolic panel If you have labs (blood work) drawn today and your tests are completely normal, you will receive your results only by: [place] (if you have MyChart) OR A paper copy in the mail If you have any lab test that is abnormal or we need to change your treatment, we will call you to review the results.   Testing/Procedures: None Ordered   Follow-Up: At [facility], you and your health needs are our priority.  As part of our continuing mission to provide you with exceptional heart care, we have created designated Provider Care Teams.  These Care Teams include your primary Cardiologist (physician) and Advanced Practice Providers (APPs -  Physician Assistants and Nurse Practitioners) who all work together to provide you with the care you need, when you need it.   Your next appointment:   1 year(s)  The format for your next appointment:   In Person  Provider:   You may see Mertie Moores, MD or one of the following Advanced Practice Providers on your designated Care Team:   Richardson Dopp, PA-C [place], [place]

## 2021-11-15 ENCOUNTER — Other Ambulatory Visit: Payer: Self-pay | Admitting: Cardiovascular Disease

## 2022-01-28 ENCOUNTER — Other Ambulatory Visit: Payer: Self-pay | Admitting: Cardiovascular Disease

## 2022-04-27 ENCOUNTER — Other Ambulatory Visit: Payer: Self-pay | Admitting: Cardiovascular Disease

## 2022-07-29 ENCOUNTER — Other Ambulatory Visit: Payer: Self-pay | Admitting: Cardiovascular Disease

## 2022-08-22 ENCOUNTER — Other Ambulatory Visit: Payer: Self-pay | Admitting: Cardiovascular Disease

## 2022-09-06 DEATH — deceased

## 2022-09-16 ENCOUNTER — Other Ambulatory Visit: Payer: Self-pay

## 2022-09-16 MED ORDER — METOPROLOL TARTRATE 25 MG PO TABS: 25.0000 mg | ORAL_TABLET | Freq: Two times a day (BID) | ORAL | 0 refills | Status: DC

## 2022-10-15 ENCOUNTER — Encounter: Payer: Self-pay | Admitting: Cardiovascular Disease

## 2022-10-15 NOTE — Progress Notes (Signed)
Cardiology Office Note   Date:  10/16/2022   ID:  Devlon, Dosher 1946/06/14, MRN [id number]  PCP:  Lorene Dy, MD  Cardiologist:  Mertie Moores, MD   Chief Complaint  Patient presents with   Coronary Artery Disease        Hyperlipidemia    Problem list: 1. Coronary artery disease-status post 2.5 x 18 mm Promus stent positioned in the ramus intermediate vessel (10/29/08),  S/P CABG in July, 2020  2. Hyperlipidemia 3. RBBB    [name] is a 76 yo with a hx of CAD.  He is exercising regularly.  No angina  February 17, 2013: No angina.  He had bilateral knee replacement.  He is working out 3-4 days a week - 1-1/2 hour each time.     February 21, 2014:  Riding on the stationary bike regularly.  Also boxing (speed bag).  No angina.  He had pneumonia this past winter - total of 4 weeks of abx.   February 21, 2015:   [name]  is a 76 y.o. male who presents for his CAD Is working out regulary Still working part time.    February 27, 2016:  [name] is doing well. Boxing with the speed bag and the heavy bad Stationary bike, sit ups .  Weights.  No CP , no dyspnea.   February 26, 2017  [name] is doing well  BP is a bit elevated here.   Its normal at the [facility] - checks it regularly  No CP , no dyspnea.   Oct. 1, 2020   Had a cardiac arrest in June,  Cath showed 3 V cad,   Had CABG on 06/08/19. Doing well.  No CP  Finishing cardiac rehab  Oct. 4, 2021:  [name] is seen today for follow up of his CAD. He is s/p CABG ( following cardiac arrest in July 2020) for severe 3 V CAD. Still working out.  No CP , no dyspnea.    Sept. 23, 2022: [name] is seen for follow up of his CAD Hx of CABG in 2020  Working out regularly   Nov. 10, 2023  [name] is seen today for follow up visit  Hx of CAD, severe CAD , CABG BP has been a little   Is on amlodipine 5 mg a day  Has not tried valsartan to his memory   Will DC amlodipne and try Valsartan 160 mg a day  . Avoids salt and salty  foods.   Wt is 221 lbs     Past Medical History:  Diagnosis Date   Arthritis    OA AND PAIN BOTH KNEES   Asthma    AS A CHILD   Chronic knee pain    Coronary artery disease    a. PTCA/stenting ramus inter. vess.; 10/2008,  b. cardiac arrest 06/03/2019, CABG X3 LIMA-LAD, SVG-RI, SVG-Circ 06/08/2019   GERD (gastroesophageal reflux disease)    MILD--WOULD TAKE ROLAID IF NEEDED   Hernia    "AT MY BELLY BUTTON" - NO PAIN OR PROBLEMS   Hyperlipidemia    Hypertension    Premature atrial contractions    Cardiac monitor 03/2020: NSR, rare PVCs, 3 beat run SVT, rare PVCs   Restless leg syndrome    Seasonal allergies    FREQENT BRONCHITIS. SINUS INFECTIONS WITH SEASON CHANGES   Shingles DEC 2012   MILD CASE-NO RESIDUAL PROBLEMS   Sleep apnea 07/06/12   STOP BANG SCORE OF 4    Past Surgical History:  Procedure Laterality Date   CARDIAC CATHETERIZATION     10/30/2008;  PTCA/stent ramus interm. vess.   CORONARY ANGIOPLASTY     CORONARY ARTERY BYPASS GRAFT N/A 06/08/2019   Procedure: CORONARY ARTERY BYPASS GRAFTING (CABG), ON PUMP, TIMES THREE, USING LEFT INTERNAL MAMMARY ARTERY AND ENDOSCOPICALLY HARVESTED LEFT GREATER SAPHENOUS VEIN;  Surgeon: Ivin Poot, MD;  Location: [facility];  Service: Open Heart Surgery;  Laterality: N/A;   CORONARY/GRAFT ACUTE MI REVASCULARIZATION N/A 06/04/2019   Procedure: Coronary/Graft Acute MI Revascularization;  Surgeon: Burnell Blanks, MD;  Location: [facility];  Service: Cardiovascular;  Laterality: N/A;   JOINT REPLACEMENT     KNEE SURGERY     right and left   LEFT HEART CATH AND CORONARY ANGIOGRAPHY N/A 06/04/2019   Procedure: LEFT HEART CATH AND CORONARY ANGIOGRAPHY;  Surgeon: Burnell Blanks, MD;  Location: [facility];  Service: Cardiovascular;  Laterality: N/A;   TEE WITHOUT CARDIOVERSION N/A 06/08/2019   Procedure: TRANSESOPHAGEAL ECHOCARDIOGRAM (TEE);  Surgeon: Prescott Gum, Collier Salina, MD;  Location: [facility];  Service: Open Heart  Surgery;  Laterality: N/A;   TOTAL KNEE ARTHROPLASTY  07/26/2012   Procedure: TOTAL KNEE ARTHROPLASTY;  Surgeon: Mauri Pole, MD;  Location: [facility];  Service: Orthopedics;  Laterality: Left;   TOTAL KNEE ARTHROPLASTY  10/25/2012   Procedure: TOTAL KNEE ARTHROPLASTY;  Surgeon: Mauri Pole, MD;  Location: [facility];  Service: Orthopedics;  Laterality: Right;     Current Outpatient Medications  Medication Sig Dispense Refill   ALPRAZolam (XANAX) 0.25 MG tablet Take 0.25 mg by mouth at bedtime as needed.     aspirin EC 81 MG tablet Take 81 mg by mouth every morning.     atorvastatin (LIPITOR) 80 MG tablet Take 80 mg by mouth daily.     clopidogrel (PLAVIX) 75 MG tablet TAKE 1 TABLET BY MOUTH EVERY DAY IN THE MORNING 90 tablet 1   diphenhydrAMINE (BENADRYL) 25 MG tablet Take 25 mg by mouth daily as needed for allergies.      famotidine (PEPCID) 20 MG tablet Take 20 mg by mouth 2 (two) times daily.     Fluticasone Propionate (FLONASE NA) Place 1 spray into the nose as needed (allergies). For sinus infection     metoprolol tartrate (LOPRESSOR) 25 MG tablet Take 1 tablet (25 mg total) by mouth 2 (two) times daily. 60 tablet 0   valsartan (DIOVAN) 160 MG tablet Take 1 tablet (160 mg total) by mouth daily. 90 tablet 3   vitamin B-12 (CYANOCOBALAMIN) 1000 MCG tablet Take 1,000 mcg by mouth daily.     zinc gluconate 50 MG tablet Take 25 mg by mouth daily.      nitroGLYCERIN (NITROSTAT) 0.4 MG SL tablet Dissolve 1 tablet under the tongue every 5 minutes as needed for chest pain. Max of 3 doses, then 911. 25 tablet 6   No current facility-administered medications for this visit.    Allergies:   Clarithromycin    Social History:  The patient  reports that he has never smoked. He has quit using smokeless tobacco. He reports current alcohol use. He reports that he does not use drugs.   Family History:  The patient's family history includes CAD in his father; COPD in his father; Hypertension in his  mother.    ROS:  Please see the history of present illness.    Physical Exam: Blood pressure 134/76, pulse 70, height '6\' 3"'$  (1.905 m), weight 221 lb (100.2 kg), SpO2  97 %.       GEN:  Well nourished, well developed in no acute distress HEENT: Normal NECK: No JVD; No carotid bruits LYMPHATICS: No lymphadenopathy CARDIAC: RRR , no murmurs, rubs, gallops RESPIRATORY:  Clear to auscultation without rales, wheezing or rhonchi  ABDOMEN: Soft, non-tender, non-distended MUSCULOSKELETAL:  No edema; No deformity  SKIN: Warm and dry NEUROLOGIC:  Alert and oriented x 3  EKG:     Recent Labs: No results found for requested labs within last 365 days.    Lipid Panel    Component Value Date/Time   CHOL 149 08/29/2021 0956   TRIG 133 08/29/2021 0956   HDL 63 08/29/2021 0956   CHOLHDL 2.4 08/29/2021 0956   CHOLHDL 2.5 06/04/2019 1445   VLDL 23 06/04/2019 1445   LDLCALC 63 08/29/2021 0956      Wt Readings from Last 3 Encounters:  10/16/22 221 lb (100.2 kg)  08/29/21 217 lb (98.4 kg)  03/11/21 219 lb (99.3 kg)      Other studies Reviewed: Additional studies/ records that were reviewed today include: . Review of the above records demonstrates:   ASSESSMENT AND PLAN:   1. Coronary artery disease-status post 2.5 x 18 mm Promus stent positioned in the ramus intermediate vessel (10/29/08)  S/p cardiac arrest in June, 2020 .   Cath showed 3 V CAD Had CABG 06/08/2019.    No angina  Continue plavix, ASA     2. Hyperlipidemia - Check lipids and ALT today.  Continue atorvastatin.  His goal LDL is now 55.  3. RBBB -  stable    4.  Hypertension: Blood pressure is borderline elevated.  I think he would do better on valsartan.  We will discontinue amlodipine and start him on valsartan 160 mg a day. LVEF is 50 to 55% by echo in 2020.  Current medicines are reviewed at length with the patient today.  The patient does not have concerns regarding medicines.  The following changes  have been made:  no change  Labs/ tests ordered today include:   Orders Placed This Encounter  Procedures   Basic metabolic panel   Lipid panel   ALT   Basic metabolic panel   EKG 60-FUXN      Disposition:       Signed, Mertie Moores, MD  10/16/2022 9:51 AM    [facility] [place], [place], [place]  [address] Phone: [phone]; Fax: [phone]

## 2022-10-16 ENCOUNTER — Encounter: Payer: Self-pay | Admitting: Cardiovascular Disease

## 2022-10-16 ENCOUNTER — Ambulatory Visit: Payer: PPO | Attending: Cardiovascular Disease | Admitting: Cardiovascular Disease

## 2022-10-16 VITALS — BP 134/76 | HR 70 | Ht 75.0 in | Wt 221.0 lb

## 2022-10-16 DIAGNOSIS — I2510 Atherosclerotic heart disease of native coronary artery without angina pectoris: Secondary | ICD-10-CM | POA: Diagnosis not present

## 2022-10-16 DIAGNOSIS — I10 Essential (primary) hypertension: Secondary | ICD-10-CM

## 2022-10-16 DIAGNOSIS — E782 Mixed hyperlipidemia: Secondary | ICD-10-CM | POA: Diagnosis not present

## 2022-10-16 DIAGNOSIS — Z79899 Other long term (current) drug therapy: Secondary | ICD-10-CM

## 2022-10-16 LAB — BASIC METABOLIC PANEL
BUN/Creatinine Ratio: 13 (ref 10–24)
BUN: 13 mg/dL (ref 8–27)
CO2: 26 mmol/L (ref 20–29)
Calcium: 9.5 mg/dL (ref 8.6–10.2)
Chloride: 103 mmol/L (ref 96–106)
Creatinine, Ser: 0.98 mg/dL (ref 0.76–1.27)
Glucose: 100 mg/dL — ABNORMAL HIGH (ref 70–99)
Potassium: 4.2 mmol/L (ref 3.5–5.2)
Sodium: 140 mmol/L (ref 134–144)
eGFR: 79 mL/min/{1.73_m2} (ref >59)

## 2022-10-16 LAB — ALT: ALT: 47 IU/L — ABNORMAL HIGH (ref 0–44)

## 2022-10-16 LAB — LIPID PANEL
Chol/HDL Ratio: 2.5 ratio (ref 0.0–5.0)
Cholesterol, Total: 141 mg/dL (ref 100–199)
HDL: 57 mg/dL (ref >39)
LDL Chol Calc (NIH): 58 mg/dL (ref 0–99)
Triglycerides: 153 mg/dL — ABNORMAL HIGH (ref 0–149)
VLDL Cholesterol Cal: 26 mg/dL (ref 5–40)

## 2022-10-16 MED ORDER — VALSARTAN 160 MG PO TABS: 160.0000 mg | ORAL_TABLET | Freq: Every day | ORAL | 3 refills | Status: DC

## 2022-10-16 MED ORDER — NITROGLYCERIN 0.4 MG SL SUBL: SUBLINGUAL_TABLET | SUBLINGUAL | 6 refills | Status: DC

## 2022-10-16 NOTE — Patient Instructions (Signed)
Medication Instructions:  STOP Amlodipine START Valsartan '160mg'$  daily REFILLED Nitroglycerin *If you need a refill on your cardiac medications before your next appointment, please call your pharmacy*   Lab Work: Lipids, BMET, ALT today BMET in 2-3 weeks If you have labs (blood work) drawn today and your tests are completely normal, you will receive your results only by: [place] (if you have MyChart) OR A paper copy in the mail If you have any lab test that is abnormal or we need to change your treatment, we will call you to review the results.   Testing/Procedures: NONE   Follow-Up: At [facility], you and your health needs are our priority.  As part of our continuing mission to provide you with exceptional heart care, we have created designated Provider Care Teams.  These Care Teams include your primary Cardiologist (physician) and Advanced Practice Providers (APPs -  Physician Assistants and Nurse Practitioners) who all work together to provide you with the care you need, when you need it.  Your next appointment:   6 month(s)  The format for your next appointment:   In Person  Provider:   Mertie Moores, MD  or APP      Important Information About Sugar

## 2022-10-19 ENCOUNTER — Telehealth: Payer: Self-pay | Admitting: Cardiovascular Disease

## 2022-10-19 ENCOUNTER — Other Ambulatory Visit: Payer: Self-pay | Admitting: Cardiovascular Disease

## 2022-10-19 MED ORDER — METOPROLOL TARTRATE 25 MG PO TABS: 25.0000 mg | ORAL_TABLET | Freq: Two times a day (BID) | ORAL | 3 refills | Status: DC

## 2022-10-19 NOTE — Telephone Encounter (Signed)
*  STAT* If patient is at the pharmacy, call can be transferred to refill team.   1. Which medications need to be refilled? (please list name of each medication and dose if known)   metoprolol tartrate (LOPRESSOR) 25 MG tablet   2. Which pharmacy/location (including street and city if local pharmacy) is medication to be sent to?  [place], [place]   3. Do they need a 30 day or 90 day supply?  90 day  Patient stated he only has 3 tablets left.  Patient requests a call back when sent to pharmacy.

## 2022-10-19 NOTE — Telephone Encounter (Signed)
Pt's medication was sent to pt's pharmacy as requested. Confirmation received.  °

## 2022-10-26 ENCOUNTER — Encounter: Payer: Self-pay | Admitting: Cardiovascular Disease

## 2022-11-06 ENCOUNTER — Ambulatory Visit: Payer: PPO | Attending: Cardiovascular Disease

## 2022-11-06 DIAGNOSIS — Z79899 Other long term (current) drug therapy: Secondary | ICD-10-CM

## 2022-11-07 LAB — BASIC METABOLIC PANEL
BUN/Creatinine Ratio: 16 (ref 10–24)
BUN: 15 mg/dL (ref 8–27)
CO2: 25 mmol/L (ref 20–29)
Calcium: 9.4 mg/dL (ref 8.6–10.2)
Chloride: 101 mmol/L (ref 96–106)
Creatinine, Ser: 0.94 mg/dL (ref 0.76–1.27)
Glucose: 99 mg/dL (ref 70–99)
Potassium: 4.6 mmol/L (ref 3.5–5.2)
Sodium: 137 mmol/L (ref 134–144)
eGFR: 83 mL/min/{1.73_m2} (ref >59)

## 2023-01-18 ENCOUNTER — Other Ambulatory Visit: Payer: Self-pay

## 2023-01-18 MED ORDER — CLOPIDOGREL BISULFATE 75 MG PO TABS: ORAL_TABLET | ORAL | 2 refills | Status: DC

## 2023-04-28 ENCOUNTER — Telehealth: Payer: Self-pay | Admitting: Cardiovascular Disease

## 2023-04-28 DIAGNOSIS — I4892 Unspecified atrial flutter: Secondary | ICD-10-CM

## 2023-04-28 NOTE — Telephone Encounter (Signed)
Returned call to patient. He states he dropped off a copy of his EKG from his OV with his PCP today for Dr. Elease Hashimoto to review.  Informed patient there is nothing in Dr. Harvie Bridge mailbox yet. It may also be getting uploaded to his chart. Will keep an eye out let him know if we do not receive his EKG.

## 2023-04-28 NOTE — Telephone Encounter (Signed)
Patient stated he dropped of a document for Dr. Elease Hashimoto and wants his nurse to call him back to confirm the document was received.

## 2023-04-28 NOTE — Telephone Encounter (Signed)
Paper Work Dropped Off: EKG  Date: 05.22.24  Location of paper:  Dr [employer]

## 2023-05-05 NOTE — Telephone Encounter (Signed)
EKG shown to Nahser who would like to make medication changes to address new A-flutter. EKG signed and sent to be scanned to chart.

## 2023-05-06 ENCOUNTER — Telehealth: Payer: Self-pay | Admitting: Cardiovascular Disease

## 2023-05-06 MED ORDER — APIXABAN 5 MG PO TABS
5.0000 mg | ORAL_TABLET | Freq: Two times a day (BID) | ORAL | 3 refills | Status: DC
Start: 2023-05-06 — End: 2024-05-03

## 2023-05-06 NOTE — Telephone Encounter (Signed)
Returned pts call about eliquis dose change. Pt wanted to know if he was still needing to take the aspirin 81mg  as well. Told pt I would check on that for him and someone would give him a call back. Patient stated to call [phone]  and that it was ok to leave a message.

## 2023-05-06 NOTE — Telephone Encounter (Signed)
Nahser, Deloris Ping, MD  Eugene Gavia K Caller: Unspecified (1 week ago) Pt has new diagnosis of atrial flutter Please DC plavix Start Eliquis 5 mg po BID Echocardiogram - "new onset atrial flutter " He should get an appt with me or an APP in the next month or so with ECG to potentially Set up cardioversion if he remains in atrial flutter at that next visit PN  Called and spoke with patient's wife on DPR and reviewed all of the above information. ECHO scheduled for 05/31/23. Current appt scheduled for 7/12-patient will call back once calendar is available

## 2023-05-06 NOTE — Telephone Encounter (Signed)
Patient just wants to confirm the dosage of his Eliquis. He states that he was told to stop Clopidogrel 75mg  and start Eliquis 5mg  today.  Call pt's mobile number: [phone]

## 2023-05-07 NOTE — Telephone Encounter (Signed)
Returned call and spoke again with wife on DPR who understands that he is to stop plavix, start eliquis 5mg  twice daily and continue on aspirin 81mg  daily. No further questions.

## 2023-05-31 ENCOUNTER — Ambulatory Visit (HOSPITAL_COMMUNITY): Payer: PPO

## 2023-06-17 NOTE — Progress Notes (Signed)
Cardiology Office Note   Date:  06/18/2023   ID:  [name], [name] 05-Jan-1946, MRN [id number]  PCP:  Burton Apley, MD  Cardiologist:  Kristeen Miss, MD   Chief Complaint  Patient presents with   Atrial Fibrillation        Coronary Artery Disease         Problem list: 1. Coronary artery disease-status post 2.5 x 18 mm Promus stent positioned in the ramus intermediate vessel (10/29/08),  S/P CABG in July, 2020  2. Hyperlipidemia 3. RBBB    [name] is a 77 yo with a hx of CAD.  He is exercising regularly.  No angina  February 17, 2013: No angina.  He had bilateral knee replacement.  He is working out 3-4 days a week - 1-1/2 hour each time.     February 21, 2014:  Riding on the stationary bike regularly.  Also boxing (speed bag).  No angina.  He had pneumonia this past winter - total of 4 weeks of abx.   February 21, 2015:   [name]  is a 77 y.o. male who presents for his CAD Is working out regulary Still working part time.    February 27, 2016:  [name] is doing well. Boxing with the speed bag and the heavy bad Stationary bike, sit ups .  Weights.  No CP , no dyspnea.   February 26, 2017  [name] is doing well  BP is a bit elevated here.   Its [facility] at the [facility] - checks [facility] regularly  No CP , no dyspnea.   Oct. 1, 2020   Had a cardiac arrest in June,  Cath showed 3 V cad,   Had CABG on 06/08/19. Doing well.  No CP  Finishing cardiac rehab  Oct. 4, 2021:  [name] is seen today for follow up of his CAD. He is s/p CABG ( following cardiac arrest in July 2020) for severe 3 V CAD. Still working out.  No CP , no dyspnea.    Sept. 23, 2022: [name] is seen for follow up of his CAD Hx of CABG in 2020  Working out regularly   Nov. 10, 2023  [name] is seen today for follow up visit  Hx of CAD, severe CAD , CABG BP has been a little   Is on amlodipine 5 mg a day  Has not tried valsartan to his memory   Will DC amlodipne and try Valsartan 160 mg a day  . Avoids salt and  salty foods.   Wt is 221 lbs     June 18, 2023  [name] is seen for follow up of new dx atrial fib. Hx of CAD,   Is still in atrial flutter  On Eliquis for 1 month      Past Medical History:  Diagnosis Date   Arthritis    OA AND PAIN BOTH KNEES   Asthma    AS A CHILD   Chronic knee pain    Coronary artery disease    a. PTCA/stenting ramus inter. vess.; 10/2008,  b. cardiac arrest 06/03/2019, CABG X3 LIMA-LAD, SVG-RI, SVG-Circ 06/08/2019   GERD (gastroesophageal reflux disease)    MILD--WOULD TAKE ROLAID IF NEEDED   Hernia    "AT MY BELLY BUTTON" - NO PAIN OR PROBLEMS   Hyperlipidemia    Hypertension    Premature atrial contractions    Cardiac monitor 03/2020: NSR, rare PVCs, 3 beat run SVT, rare PVCs   Restless leg syndrome  Seasonal allergies    FREQENT BRONCHITIS. SINUS INFECTIONS WITH SEASON CHANGES   Shingles DEC 2012   MILD CASE-NO RESIDUAL PROBLEMS   Sleep apnea 07/06/12   STOP BANG SCORE OF 4    Past Surgical History:  Procedure Laterality Date   CARDIAC CATHETERIZATION     10/30/2008;  PTCA/stent ramus interm. vess.   CORONARY ANGIOPLASTY     CORONARY ARTERY BYPASS GRAFT N/A 06/08/2019   Procedure: CORONARY ARTERY BYPASS GRAFTING (CABG), ON PUMP, TIMES THREE, USING LEFT INTERNAL MAMMARY ARTERY AND ENDOSCOPICALLY HARVESTED LEFT GREATER SAPHENOUS VEIN;  Surgeon: Kerin Perna, MD;  Location: [facility];  Service: Open Heart Surgery;  Laterality: N/A;   CORONARY/GRAFT ACUTE MI REVASCULARIZATION N/A 06/04/2019   Procedure: Coronary/Graft Acute MI Revascularization;  Surgeon: Kathleene Hazel, MD;  Location: [facility];  Service: Cardiovascular;  Laterality: N/A;   JOINT REPLACEMENT     KNEE SURGERY     right and left   LEFT HEART CATH AND CORONARY ANGIOGRAPHY N/A 06/04/2019   Procedure: LEFT HEART CATH AND CORONARY ANGIOGRAPHY;  Surgeon: Kathleene Hazel, MD;  Location: [facility];  Service: Cardiovascular;  Laterality: N/A;   TEE WITHOUT  CARDIOVERSION N/A 06/08/2019   Procedure: TRANSESOPHAGEAL ECHOCARDIOGRAM (TEE);  Surgeon: Donata Clay, Theron Arista, MD;  Location: [facility] OR;  Service: Open Heart Surgery;  Laterality: N/A;   TOTAL KNEE ARTHROPLASTY  07/26/2012   Procedure: TOTAL KNEE ARTHROPLASTY;  Surgeon: [name], MD;  Location: [facility];  Service: Orthopedics;  Laterality: Left;   TOTAL KNEE ARTHROPLASTY  10/25/2012   Procedure: TOTAL KNEE ARTHROPLASTY;  Surgeon: [name], MD;  Location: [facility];  Service: Orthopedics;  Laterality: Right;     Current Outpatient Medications  Medication Sig Dispense Refill   ALPRAZolam (XANAX) 0.25 MG tablet Take 0.25 mg by mouth at bedtime as needed.     apixaban (ELIQUIS) 5 MG TABS tablet Take 1 tablet (5 mg total) by mouth 2 (two) times daily. 180 tablet 3   atorvastatin (LIPITOR) 80 MG tablet Take 80 mg by mouth daily.     calcium carbonate (TUMS) 500 MG chewable tablet as needed for heartburn or indigestion.     clotrimazole (LOTRIMIN) 1 % cream Apply topically as needed (rash).     diphenhydrAMINE (BENADRYL) 25 MG tablet Take 25 mg by mouth daily as needed for allergies.      famotidine (PEPCID) 20 MG tablet Take 20 mg by mouth 2 (two) times daily.     Fluticasone Propionate (FLONASE NA) Place 1 spray into the nose as needed (allergies). For sinus infection     metoprolol tartrate (LOPRESSOR) 25 MG tablet Take 1 tablet (25 mg total) by mouth 2 (two) times daily. 180 tablet 3   nitroGLYCERIN (NITROSTAT) 0.4 MG SL tablet Dissolve 1 tablet under the tongue every 5 minutes as needed for chest pain. Max of 3 doses, then 911. 25 tablet 6   valsartan (DIOVAN) 160 MG tablet Take 1 tablet (160 mg total) by mouth daily. 90 tablet 3   vitamin B-12 (CYANOCOBALAMIN) 1000 MCG tablet Take 1,000 mcg by mouth daily.     zinc gluconate 50 MG tablet Take 25 mg by mouth daily.      guaiFENesin (MUCINEX) 600 MG 12 hr tablet 2 (two) times daily.     No current facility-administered medications for this visit.     Allergies:   Clarithromycin    Social History:  The patient  reports that he has never  smoked. He has quit using smokeless tobacco. He reports current alcohol use. He reports that he does not use drugs.   Family History:  The patient's family history includes CAD in his father; COPD in his father; Hypertension in his mother.    ROS:  Please see the history of present illness.    Physical Exam: Blood pressure 110/68, pulse (!) 59, height 6\' 3"  (1.905 m), weight 215 lb (97.5 kg), SpO2 99%.       GEN:  Well nourished, well developed in no acute distress HEENT: Normal NECK: No JVD; No carotid bruits LYMPHATICS: No lymphadenopathy CARDIAC: RRR , no murmurs, rubs, gallops,  accentuated splitting of S2.  RESPIRATORY:  Clear to auscultation without rales, wheezing or rhonchi  ABDOMEN: Soft, non-tender, non-distended MUSCULOSKELETAL:  No edema; No deformity  SKIN: Warm and dry NEUROLOGIC:  Alert and oriented x 3  EKG:  EKG Interpretation Date/Time:  Friday June 18 2023 13:22:40 EDT Ventricular Rate:  59 PR Interval:    QRS Duration:  142 QT Interval:  472 QTC Calculation: 467 R Axis:   90  Text Interpretation:   Suspect arm lead reversal, interpretation assumes no reversal Atrial flutter with 4:1 A-V conduction Right bundle branch block When compared with ECG of 14-Jul-2019 12:49, PREVIOUS ECG IS PRESENT Confirmed by Kristeen Miss [phone]) on 06/18/2023 1:34:59 PM     Recent Labs: 10/16/2022: ALT 47 11/06/2022: BUN 15; Creatinine, Ser 0.94; Potassium 4.6; Sodium 137    Lipid Panel    Component Value Date/Time   CHOL 141 10/16/2022 0956   TRIG 153 (H) 10/16/2022 0956   HDL 57 10/16/2022 0956   CHOLHDL 2.5 10/16/2022 0956   CHOLHDL 2.5 06/04/2019 1445   VLDL 23 06/04/2019 1445   LDLCALC 58 10/16/2022 0956      Wt Readings from Last 3 Encounters:  06/18/23 215 lb (97.5 kg)  10/16/22 221 lb (100.2 kg)  08/29/21 217 lb (98.4 kg)      Other studies  Reviewed: Additional studies/ records that were reviewed today include: . Review of the above records demonstrates:   ASSESSMENT AND PLAN:   1. Coronary artery disease-status post 2.5 x 18 mm Promus stent positioned in the ramus intermediate vessel (10/29/08)  S/p cardiac arrest in June, 2020 .   Cath showed 3 V CAD Had CABG 06/08/2019.    No angina  Continue plavix, ASA     2. Atrail flutter:   still in atrial flutter  Will schedule him for CV   We have discussed the risk, benefits, options of cardioversion.  He understands and agrees to proceed. He knows that he needs to continue taking Eliquis lifelong at this point.  If he has recurrent atrial flutter then we will consider referral to electrophysiology for consideration of ablation.       3. RBBB -   has    4.  Hypertension:  BP is well controled.    5.  Hyperlipidemia :  stable    Current medicines are reviewed at length with the patient today.  The patient does not have concerns regarding medicines.  The following changes have been made:  no change  Labs/ tests ordered today include:   Orders Placed This Encounter  Procedures   Basic metabolic panel   CBC   EKG 12-Lead      Disposition:       Signed, Kristeen Miss, MD  06/18/2023 5:09 PM    [facility] [address] [place], [place],  [place]  [address] Phone: [phone]; Fax: [phone]

## 2023-06-17 NOTE — H&P (View-Only) (Signed)
Cardiology Office Note   Date:  06/18/2023   ID:  [name] 05-Jan-1946, MRN [id number]  PCP:  Burton Apley, MD  Cardiologist:  Kristeen Miss, MD   Chief Complaint  Patient presents with   Atrial Fibrillation        Coronary Artery Disease         Problem list: 1. Coronary artery disease-status post 2.5 x 18 mm Promus stent positioned in the ramus intermediate vessel (10/29/08),  S/P CABG in July, 2020  2. Hyperlipidemia 3. RBBB    [name] is a 77 yo with a hx of CAD.  He is exercising regularly.  No angina  February 17, 2013: No angina.  He had bilateral knee replacement.  He is working out 3-4 days a week - 1-1/2 hour each time.     February 21, 2014:  Riding on the stationary bike regularly.  Also boxing (speed bag).  No angina.  He had pneumonia this past winter - total of 4 weeks of abx.   February 21, 2015:   [name]  is a 77 y.o. male who presents for his CAD Is working out regulary Still working part time.    February 27, 2016:  [name] is doing well. Boxing with the speed bag and the heavy bad Stationary bike, sit ups .  Weights.  No CP , no dyspnea.   February 26, 2017  [name] is doing well  BP is a bit elevated here.   Its [facility] at the [facility] - checks it regularly  No CP , no dyspnea.   Oct. 1, 2020   Had a cardiac arrest in June,  Cath showed 3 V cad,   Had CABG on 06/08/19. Doing well.  No CP  Finishing cardiac rehab  Oct. 4, 2021:  [name] is seen today for follow up of his CAD. He is s/p CABG ( following cardiac arrest in July 2020) for severe 3 V CAD. Still working out.  No CP , no dyspnea.    Sept. 23, 2022: [name] is seen for follow up of his CAD Hx of CABG in 2020  Working out regularly   Nov. 10, 2023  [name] is seen today for follow up visit  Hx of CAD, severe CAD , CABG BP has been a little   Is on amlodipine 5 mg a day  Has not tried valsartan to his memory   Will DC amlodipne and try Valsartan 160 mg a day  . Avoids salt and  salty foods.   Wt is 221 lbs     June 18, 2023  [name] is seen for follow up of new dx atrial fib. Hx of CAD,   Is still in atrial flutter  On Eliquis for 1 month      Past Medical History:  Diagnosis Date   Arthritis    OA AND PAIN BOTH KNEES   Asthma    AS A CHILD   Chronic knee pain    Coronary artery disease    a. PTCA/stenting ramus inter. vess.; 10/2008,  b. cardiac arrest 06/03/2019, CABG X3 LIMA-LAD, SVG-RI, SVG-Circ 06/08/2019   GERD (gastroesophageal reflux disease)    MILD--WOULD TAKE ROLAID IF NEEDED   Hernia    "AT MY BELLY BUTTON" - NO PAIN OR PROBLEMS   Hyperlipidemia    Hypertension    Premature atrial contractions    Cardiac monitor 03/2020: NSR, rare PVCs, 3 beat run SVT, rare PVCs   Restless leg syndrome  Seasonal allergies    FREQENT BRONCHITIS. SINUS INFECTIONS WITH SEASON CHANGES   Shingles DEC 2012   MILD CASE-NO RESIDUAL PROBLEMS   Sleep apnea 07/06/12   STOP BANG SCORE OF 4    Past Surgical History:  Procedure Laterality Date   CARDIAC CATHETERIZATION     10/30/2008;  PTCA/stent ramus interm. vess.   CORONARY ANGIOPLASTY     CORONARY ARTERY BYPASS GRAFT N/A 06/08/2019   Procedure: CORONARY ARTERY BYPASS GRAFTING (CABG), ON PUMP, TIMES THREE, USING LEFT INTERNAL MAMMARY ARTERY AND ENDOSCOPICALLY HARVESTED LEFT GREATER SAPHENOUS VEIN;  Surgeon: Kerin Perna, MD;  Location: [facility] OR;  Service: Open Heart Surgery;  Laterality: N/A;   CORONARY/GRAFT ACUTE MI REVASCULARIZATION N/A 06/04/2019   Procedure: Coronary/Graft Acute MI Revascularization;  Surgeon: Kathleene Hazel, MD;  Location: [facility] INVASIVE CV LAB;  Service: Cardiovascular;  Laterality: N/A;   JOINT REPLACEMENT     KNEE SURGERY     right and left   LEFT HEART CATH AND CORONARY ANGIOGRAPHY N/A 06/04/2019   Procedure: LEFT HEART CATH AND CORONARY ANGIOGRAPHY;  Surgeon: Kathleene Hazel, MD;  Location: [facility] INVASIVE CV LAB;  Service: Cardiovascular;  Laterality: N/A;   TEE WITHOUT  CARDIOVERSION N/A 06/08/2019   Procedure: TRANSESOPHAGEAL ECHOCARDIOGRAM (TEE);  Surgeon: Donata Clay, Theron Arista, MD;  Location: [facility] OR;  Service: Open Heart Surgery;  Laterality: N/A;   TOTAL KNEE ARTHROPLASTY  07/26/2012   Procedure: TOTAL KNEE ARTHROPLASTY;  Surgeon: [name], MD;  Location: [facility] ORS;  Service: Orthopedics;  Laterality: Left;   TOTAL KNEE ARTHROPLASTY  10/25/2012   Procedure: TOTAL KNEE ARTHROPLASTY;  Surgeon: [name], MD;  Location: [facility] ORS;  Service: Orthopedics;  Laterality: Right;     Current Outpatient Medications  Medication Sig Dispense Refill   ALPRAZolam (XANAX) 0.25 MG tablet Take 0.25 mg by mouth at bedtime as needed.     apixaban (ELIQUIS) 5 MG TABS tablet Take 1 tablet (5 mg total) by mouth 2 (two) times daily. 180 tablet 3   atorvastatin (LIPITOR) 80 MG tablet Take 80 mg by mouth daily.     calcium carbonate (TUMS) 500 MG chewable tablet as needed for heartburn or indigestion.     clotrimazole (LOTRIMIN) 1 % cream Apply topically as needed (rash).     diphenhydrAMINE (BENADRYL) 25 MG tablet Take 25 mg by mouth daily as needed for allergies.      famotidine (PEPCID) 20 MG tablet Take 20 mg by mouth 2 (two) times daily.     Fluticasone Propionate (FLONASE NA) Place 1 spray into the nose as needed (allergies). For sinus infection     metoprolol tartrate (LOPRESSOR) 25 MG tablet Take 1 tablet (25 mg total) by mouth 2 (two) times daily. 180 tablet 3   nitroGLYCERIN (NITROSTAT) 0.4 MG SL tablet Dissolve 1 tablet under the tongue every 5 minutes as needed for chest pain. Max of 3 doses, then 911. 25 tablet 6   valsartan (DIOVAN) 160 MG tablet Take 1 tablet (160 mg total) by mouth daily. 90 tablet 3   vitamin B-12 (CYANOCOBALAMIN) 1000 MCG tablet Take 1,000 mcg by mouth daily.     zinc gluconate 50 MG tablet Take 25 mg by mouth daily.      guaiFENesin (MUCINEX) 600 MG 12 hr tablet 2 (two) times daily.     No current facility-administered medications for this visit.     Allergies:   Clarithromycin    Social History:  The patient  reports that he has never  smoked. He has quit using smokeless tobacco. He reports current alcohol use. He reports that he does not use drugs.   Family History:  The patient's family history includes CAD in his father; COPD in his father; Hypertension in his mother.    ROS:  Please see the history of present illness.    Physical Exam: Blood pressure 110/68, pulse (!) 59, height 6\' 3"  (1.905 m), weight 215 lb (97.5 kg), SpO2 99%.       GEN:  Well nourished, well developed in no acute distress HEENT: Normal NECK: No JVD; No carotid bruits LYMPHATICS: No lymphadenopathy CARDIAC: RRR , no murmurs, rubs, gallops,  accentuated splitting of S2.  RESPIRATORY:  Clear to auscultation without rales, wheezing or rhonchi  ABDOMEN: Soft, non-tender, non-distended MUSCULOSKELETAL:  No edema; No deformity  SKIN: Warm and dry NEUROLOGIC:  Alert and oriented x 3  EKG:  EKG Interpretation Date/Time:  Friday June 18 2023 13:22:40 EDT Ventricular Rate:  59 PR Interval:    QRS Duration:  142 QT Interval:  472 QTC Calculation: 467 R Axis:   90  Text Interpretation:   Suspect arm lead reversal, interpretation assumes no reversal Atrial flutter with 4:1 A-V conduction Right bundle branch block When compared with ECG of 14-Jul-2019 12:49, PREVIOUS ECG IS PRESENT Confirmed by Kristeen Miss [phone]) on 06/18/2023 1:34:59 PM     Recent Labs: 10/16/2022: ALT 47 11/06/2022: BUN 15; Creatinine, Ser 0.94; Potassium 4.6; Sodium 137    Lipid Panel    Component Value Date/Time   CHOL 141 10/16/2022 0956   TRIG 153 (H) 10/16/2022 0956   HDL 57 10/16/2022 0956   CHOLHDL 2.5 10/16/2022 0956   CHOLHDL 2.5 06/04/2019 1445   VLDL 23 06/04/2019 1445   LDLCALC 58 10/16/2022 0956      Wt Readings from Last 3 Encounters:  06/18/23 215 lb (97.5 kg)  10/16/22 221 lb (100.2 kg)  08/29/21 217 lb (98.4 kg)      Other studies  Reviewed: Additional studies/ records that were reviewed today include: . Review of the above records demonstrates:   ASSESSMENT AND PLAN:   1. Coronary artery disease-status post 2.5 x 18 mm Promus stent positioned in the ramus intermediate vessel (10/29/08)  S/p cardiac arrest in June, 2020 .   Cath showed 3 V CAD Had CABG 06/08/2019.    No angina  Continue plavix, ASA     2. Atrail flutter:   still in atrial flutter  Will schedule him for CV   We have discussed the risk, benefits, options of cardioversion.  He understands and agrees to proceed. He knows that he needs to continue taking Eliquis lifelong at this point.  If he has recurrent atrial flutter then we will consider referral to electrophysiology for consideration of ablation.       3. RBBB -   has    4.  Hypertension:  BP is well controled.    5.  Hyperlipidemia :  stable    Current medicines are reviewed at length with the patient today.  The patient does not have concerns regarding medicines.  The following changes have been made:  no change  Labs/ tests ordered today include:   Orders Placed This Encounter  Procedures   Basic metabolic panel   CBC   EKG 12-Lead      Disposition:       Signed, Kristeen Miss, MD  06/18/2023 5:09 PM    [facility] [address] [place], [place],  [place]  [address] Phone: [phone]; Fax: [phone]

## 2023-06-18 ENCOUNTER — Ambulatory Visit: Payer: PPO | Attending: Cardiovascular Disease | Admitting: Cardiovascular Disease

## 2023-06-18 ENCOUNTER — Ambulatory Visit (HOSPITAL_BASED_OUTPATIENT_CLINIC_OR_DEPARTMENT_OTHER): Payer: PPO

## 2023-06-18 ENCOUNTER — Encounter: Payer: Self-pay | Admitting: Cardiovascular Disease

## 2023-06-18 VITALS — BP 110/68 | HR 59 | Ht 75.0 in | Wt 215.0 lb

## 2023-06-18 DIAGNOSIS — I4892 Unspecified atrial flutter: Secondary | ICD-10-CM

## 2023-06-18 DIAGNOSIS — Z01810 Encounter for preprocedural cardiovascular examination: Secondary | ICD-10-CM | POA: Diagnosis present

## 2023-06-18 DIAGNOSIS — I2510 Atherosclerotic heart disease of native coronary artery without angina pectoris: Secondary | ICD-10-CM | POA: Diagnosis present

## 2023-06-18 DIAGNOSIS — I10 Essential (primary) hypertension: Secondary | ICD-10-CM

## 2023-06-18 LAB — ECHOCARDIOGRAM COMPLETE
Est EF: 55
Height: 75 in
S' Lateral: 2.6 cm
Weight: 3440 oz

## 2023-06-18 NOTE — Patient Instructions (Signed)
Medication Instructions:  STOP Aspirin *If you need a refill on your cardiac medications before your next appointment, please call your pharmacy*  Lab Work: BMET, CBC today If you have labs (blood work) drawn today and your tests are completely normal, you will receive your results only by: MyChart Message (if you have MyChart) OR A paper copy in the mail If you have any lab test that is abnormal or we need to change your treatment, we will call you to review the results.  Testing/Procedures: Cardioversion Your physician has recommended that you have a Cardioversion (DCCV). Electrical Cardioversion uses a jolt of electricity to your heart either through paddles or wired patches attached to your chest. This is a controlled, usually prescheduled, procedure. Defibrillation is done under light anesthesia in the hospital, and you usually go home the day of the procedure. This is done to get your heart back into a normal rhythm. You are not awake for the procedure. Please see the instruction sheet given to you today.  Follow-Up: At [facility], you and your health needs are our priority.  As part of our continuing mission to provide you with exceptional heart care, we have created designated Provider Care Teams.  These Care Teams include your primary Cardiologist (physician) and Advanced Practice Providers (APPs -  Physician Assistants and Nurse Practitioners) who all work together to provide you with the care you need, when you need it.  Your next appointment:   4 week(s)  Provider:   Kristeen Miss, MD      Other Instructions     Dear [name]  You are scheduled for a Cardioversion on Friday, July 19 with Dr. Eden Emms.  Please arrive at the [facility] (Main Entrance A) at [facility]: [address] [place], [place] [address] at 11:30 AM (This time is one hour(s) before your procedure to ensure your preparation). Free valet parking service is available. You will check in  at ADMITTING. The support person will be asked to wait in the waiting room.  It is OK to have someone drop you off and come back when you are ready to be discharged.     DIET:  Nothing to eat or drink after midnight except a sip of water with medications (see medication instructions below)  MEDICATION INSTRUCTIONS: !!IF ANY NEW MEDICATIONS ARE STARTED AFTER TODAY, PLEASE NOTIFY YOUR PROVIDER AS SOON AS POSSIBLE!!  FYI: Medications such as Semaglutide (Ozempic, [place]), Tirzepatide (Mounjaro, Zepbound), Dulaglutide (Trulicity), etc ("GLP1 agonists") AND Canagliflozin (Invokana), Dapagliflozin (Farxiga), Empagliflozin (Jardiance), Ertugliflozin (Steglatro), Bexagliflozin [employer]) or any combination with one of these drugs such as Invokamet (Canagliflozin/Metformin), Synjardy (Empagliflozin/Metformin), etc ("SGLT2 inhibitors") must be held around the time of a procedure. This is not a comprehensive list of all of these drugs. Please review all of your medications and talk to your provider if you take any one of these. If you are not sure, ask your provider.   Continue taking your anticoagulant (blood thinner): Apixaban (Eliquis).  You will need to continue this after your procedure until you are told by your provider that it is safe to stop.    LABS: TODAY  FYI:  For your safety, and to allow [place] to monitor your vital signs accurately during the surgery/procedure we request: If you have artificial nails, gel coating, SNS etc, please have those removed prior to your surgery/procedure. Not having the nail coverings /polish removed may result in cancellation or delay of your surgery/procedure.  You must have a  responsible person to drive you home and stay in the waiting area during your procedure. Failure to do so could result in cancellation.  Bring your insurance cards.  *Special Note: Every effort is made to have your procedure done on time. Occasionally there are emergencies that occur at the  hospital that may cause delays. Please be patient if a delay does occur.

## 2023-06-19 LAB — CBC
Hematocrit: 40.1 % (ref 37.5–51.0)
Hemoglobin: 14 g/dL (ref 13.0–17.7)
MCH: 32.8 pg (ref 26.6–33.0)
MCHC: 34.9 g/dL (ref 31.5–35.7)
MCV: 94 fL (ref 79–97)
Platelets: 148 10*3/uL — ABNORMAL LOW (ref 150–450)
RBC: 4.27 x10E6/uL (ref 4.14–5.80)
RDW: 13.1 % (ref 11.6–15.4)
WBC: 6.8 10*3/uL (ref 3.4–10.8)

## 2023-06-19 LAB — BASIC METABOLIC PANEL
BUN/Creatinine Ratio: 17 (ref 10–24)
BUN: 15 mg/dL (ref 8–27)
CO2: 22 mmol/L (ref 20–29)
Calcium: 9.2 mg/dL (ref 8.6–10.2)
Chloride: 94 mmol/L — ABNORMAL LOW (ref 96–106)
Creatinine, Ser: 0.88 mg/dL (ref 0.76–1.27)
Glucose: 92 mg/dL (ref 70–99)
Potassium: 4.8 mmol/L (ref 3.5–5.2)
Sodium: 131 mmol/L — ABNORMAL LOW (ref 134–144)
eGFR: 88 mL/min/{1.73_m2} (ref >59)

## 2023-06-23 ENCOUNTER — Other Ambulatory Visit (HOSPITAL_COMMUNITY): Payer: Self-pay

## 2023-06-24 NOTE — Progress Notes (Signed)
  Arrive at hospital at 1115. Patient called with pre-procedure instructions for tomorrow NPO after midnight Take morning medications w/sip of water: Take blood thinners and BP medication w/sip of water.  Hold [place], [place], [place], etc. For one week.  Confirmed no breaks in taking blood thinner for three weeks Needs a ride home and have a responsible adult to stay w/him or her for 24 hours No lotion day of procedure No jewelry

## 2023-06-25 ENCOUNTER — Ambulatory Visit (HOSPITAL_COMMUNITY)
Admission: RE | Admit: 2023-06-25 | Discharge: 2023-06-25 | Disposition: A | Discharge status: Home / Self Care | Payer: PPO | Attending: Cardiovascular Disease | Admitting: Cardiovascular Disease

## 2023-06-25 ENCOUNTER — Ambulatory Visit (HOSPITAL_COMMUNITY): Payer: PPO | Admitting: Registered Nurse

## 2023-06-25 ENCOUNTER — Other Ambulatory Visit: Payer: Self-pay

## 2023-06-25 ENCOUNTER — Encounter (HOSPITAL_COMMUNITY)
Admission: RE | Disposition: A | Discharge status: Home / Self Care | Payer: Self-pay | Source: Home / Self Care | Attending: Cardiovascular Disease

## 2023-06-25 ENCOUNTER — Ambulatory Visit (HOSPITAL_BASED_OUTPATIENT_CLINIC_OR_DEPARTMENT_OTHER): Payer: PPO | Admitting: Registered Nurse

## 2023-06-25 DIAGNOSIS — E785 Hyperlipidemia, unspecified: Secondary | ICD-10-CM | POA: Diagnosis not present

## 2023-06-25 DIAGNOSIS — Z7982 Long term (current) use of aspirin: Secondary | ICD-10-CM | POA: Insufficient documentation

## 2023-06-25 DIAGNOSIS — I4892 Unspecified atrial flutter: Secondary | ICD-10-CM | POA: Diagnosis not present

## 2023-06-25 DIAGNOSIS — I4891 Unspecified atrial fibrillation: Secondary | ICD-10-CM | POA: Diagnosis not present

## 2023-06-25 DIAGNOSIS — I4510 Unspecified right bundle-branch block: Secondary | ICD-10-CM | POA: Diagnosis not present

## 2023-06-25 DIAGNOSIS — I10 Essential (primary) hypertension: Secondary | ICD-10-CM | POA: Insufficient documentation

## 2023-06-25 DIAGNOSIS — Z951 Presence of aortocoronary bypass graft: Secondary | ICD-10-CM | POA: Insufficient documentation

## 2023-06-25 DIAGNOSIS — Z955 Presence of coronary angioplasty implant and graft: Secondary | ICD-10-CM | POA: Insufficient documentation

## 2023-06-25 DIAGNOSIS — Z7901 Long term (current) use of anticoagulants: Secondary | ICD-10-CM | POA: Insufficient documentation

## 2023-06-25 DIAGNOSIS — I2510 Atherosclerotic heart disease of native coronary artery without angina pectoris: Secondary | ICD-10-CM | POA: Insufficient documentation

## 2023-06-25 DIAGNOSIS — Z7902 Long term (current) use of antithrombotics/antiplatelets: Secondary | ICD-10-CM | POA: Insufficient documentation

## 2023-06-25 DIAGNOSIS — Z8674 Personal history of sudden cardiac arrest: Secondary | ICD-10-CM | POA: Insufficient documentation

## 2023-06-25 HISTORY — PX: CARDIOVERSION: SHX1299

## 2023-06-25 SURGERY — CARDIOVERSION
Anesthesia: General

## 2023-06-25 MED ORDER — LIDOCAINE 2% (20 MG/ML) 5 ML SYRINGE
INTRAMUSCULAR | Status: DC | PRN
  Administered 2023-06-25: 80 mg via INTRAVENOUS

## 2023-06-25 MED ORDER — SODIUM CHLORIDE 0.9 % IV SOLN: INTRAVENOUS | Status: DC

## 2023-06-25 MED ORDER — PROPOFOL 10 MG/ML IV BOLUS
INTRAVENOUS | Status: DC | PRN
Start: 2023-06-25 — End: 2023-06-25
  Administered 2023-06-25: 50 mg via INTRAVENOUS
  Administered 2023-06-25: 20 mg via INTRAVENOUS

## 2023-06-25 SURGICAL SUPPLY — 1 items: ELECT DEFIB PAD ADLT CADENCE (PAD) ×1

## 2023-06-25 NOTE — Transfer of Care (Signed)
Immediate Anesthesia Transfer of Care Note  Patient: [name]  Procedure(s) Performed: CARDIOVERSION  Patient Location: Cath Lab  Anesthesia Type:General  Level of Consciousness: drowsy and patient cooperative  Airway & Oxygen Therapy: Patient Spontanous Breathing and Patient connected to nasal cannula oxygen  Post-op Assessment: Report given to RN and Post -op Vital signs reviewed and stable  Post vital signs: Reviewed and stable  Last Vitals:  Vitals Value Taken Time  BP 146/105 06/25/23 1106  Temp    Pulse 70 06/25/23 1107  Resp 23 06/25/23 1107  SpO2 95 % 06/25/23 1107    Last Pain:  Vitals:   06/25/23 1048  TempSrc: Temporal  PainSc: 0-No pain         Complications: No notable events documented.

## 2023-06-25 NOTE — Anesthesia Preprocedure Evaluation (Signed)
Anesthesia Evaluation  Patient identified by MRN, date of birth, ID band Patient awake    Reviewed: Allergy & Precautions, NPO status , Patient's Chart, lab work & pertinent test results  History of Anesthesia Complications Negative for: history of anesthetic complications  Airway Mallampati: I  TM Distance: >3 FB Neck ROM: Full    Dental  (+) Teeth Intact, Dental Advisory Given   Pulmonary neg shortness of breath, neg sleep apnea, neg COPD, neg recent URI   breath sounds clear to auscultation       Cardiovascular hypertension, Pt. on medications + CAD, + Past MI, + Cardiac Stents and + CABG  + dysrhythmias Atrial Fibrillation  Rhythm:Irregular  1. Left ventricular ejection fraction, by estimation, is 55%. The left  ventricle has normal function. The left ventricle has no regional wall  motion abnormalities. There is mild asymmetric left ventricular  hypertrophy of the basal-septal segment. Left  ventricular diastolic parameters are indeterminate.   2. Right ventricular systolic function is mildly reduced. The right  ventricular size is mildly enlarged. There is normal pulmonary artery  systolic pressure. The estimated right ventricular systolic pressure is  32.8 mmHg.   3. Right atrial size was moderately dilated.   4. The mitral valve is normal in structure. Mild mitral valve  regurgitation. No evidence of mitral stenosis.   5. Tricuspid valve regurgitation is moderate.   6. The aortic valve is tricuspid. There is mild calcification of the  aortic valve. Aortic valve regurgitation is not visualized. No aortic  stenosis is present.   7. The inferior vena cava is normal in size with greater than 50%  respiratory variability, suggesting right atrial pressure of 3 mmHg.   8. The patient was atrial flutter.     Neuro/Psych negative neurological ROS  negative psych ROS   GI/Hepatic Neg liver ROS,GERD  ,,  Endo/Other   negative endocrine ROS    Renal/GU Lab Results      Component                Value               Date                      CREATININE               0.88                06/18/2023                Musculoskeletal  (+) Arthritis ,    Abdominal   Peds  Hematology  (+) Blood dyscrasia Lab Results      Component                Value               Date                      WBC                      6.8                 06/18/2023                HGB                      14.0  06/18/2023                HCT                      40.1                06/18/2023                MCV                      94                  06/18/2023                PLT                      148 (L)             06/18/2023             eliquis   Anesthesia Other Findings   Reproductive/Obstetrics                              Anesthesia Physical Anesthesia Plan  ASA: 2  Anesthesia Plan: General   Post-op Pain Management: Minimal or no pain anticipated   Induction: Intravenous  PONV Risk Score and Plan: 2 and Treatment may vary due to age or medical condition  Airway Management Planned: Nasal Cannula, Natural Airway, Simple Face Mask and Mask  Additional Equipment: None  Intra-op Plan:   Post-operative Plan:   Informed Consent: I have reviewed the patients History and Physical, chart, labs and discussed the procedure including the risks, benefits and alternatives for the proposed anesthesia with the patient or authorized representative who has indicated his/her understanding and acceptance.     Dental advisory given  Plan Discussed with: CRNA  Anesthesia Plan Comments:          Anesthesia Quick Evaluation

## 2023-06-25 NOTE — Interval H&P Note (Signed)
History and Physical Interval Note:  06/25/2023 10:35 AM  [name]  has presented today for surgery, with the diagnosis of aflutter.  The various methods of treatment have been discussed with the patient and family. After consideration of risks, benefits and other options for treatment, the patient has consented to  Procedure(s): CARDIOVERSION (N/A) as a surgical intervention.  The patient's history has been reviewed, patient examined, no change in status, stable for surgery.  I have reviewed the patient's chart and labs.  Questions were answered to the patient's satisfaction.     Charlton Haws

## 2023-06-25 NOTE — CV Procedure (Signed)
[facility]: Anesthesia: Dr Maple Hudson Propofol On Rx Eliquis no missed doses  DCC x 1 150J biphasic Converted from atrial flutter rate 68 to NSR rate 62 bpm  No immediate neurologic sequelae  Charlton Haws MD [facility]

## 2023-06-26 NOTE — Anesthesia Postprocedure Evaluation (Signed)
Anesthesia Post Note  Patient: [name]  Procedure(s) Performed: CARDIOVERSION     Patient location during evaluation: Cath Lab Anesthesia Type: General Level of consciousness: awake and alert Pain management: pain level controlled Vital Signs Assessment: post-procedure vital signs reviewed and stable Respiratory status: spontaneous breathing, nonlabored ventilation and respiratory function stable Cardiovascular status: blood pressure returned to baseline and stable Postop Assessment: no apparent nausea or vomiting Anesthetic complications: no   No notable events documented.  Last Vitals:  Vitals:   06/25/23 1132 06/25/23 1135  BP: (!) 175/94 (!) 172/92  Pulse: 75 76  Resp: (!) 21 12  Temp:    SpO2: 99% 98%    Last Pain:  Vitals:   06/25/23 1111  TempSrc: Temporal  PainSc: 0-No pain                 Marquisha Nikolov

## 2023-06-28 ENCOUNTER — Encounter (HOSPITAL_COMMUNITY): Payer: Self-pay | Admitting: Cardiovascular Disease

## 2023-07-18 NOTE — Progress Notes (Unsigned)
Cardiology Office Note   Date:  07/19/2023   ID:  [name], [name] 1946/10/07, MRN [id number]  PCP:  Burton Apley, MD  Cardiologist:  Kristeen Miss, MD   Chief Complaint  Patient presents with   Atrial Fibrillation         Problem list: 1. Coronary artery disease-status post 2.5 x 18 mm Promus stent positioned in the ramus intermediate vessel (10/29/08),  S/P CABG in July, 2020  2. Hyperlipidemia 3. RBBB    [name] is a 77 yo with a hx of CAD.  He is exercising regularly.  No angina  February 17, 2013: No angina.  He had bilateral knee replacement.  He is working out 3-4 days a week - 1-1/2 hour each time.     February 21, 2014:  Riding on the stationary bike regularly.  Also boxing (speed bag).  No angina.  He had pneumonia this past winter - total of 4 weeks of abx.   February 21, 2015:   [name]  is a 77 y.o. male who presents for his CAD Is working out regulary Still working part time.    February 27, 2016:  [name] is doing well. Boxing with the speed bag and the heavy bad Stationary bike, sit ups .  Weights.  No CP , no dyspnea.   February 26, 2017  [name] is doing well  BP is a bit elevated here.   Its normal at the [facility] - checks it regularly  No CP , no dyspnea.   Oct. 1, 2020   Had a cardiac arrest in June,  Cath showed 3 V cad,   Had CABG on 06/08/19. Doing well.  No CP  Finishing cardiac rehab  Oct. 4, 2021:  [name] is seen today for follow up of his CAD. He is s/p CABG ( following cardiac arrest in July 2020) for severe 3 V CAD. Still working out.  No CP , no dyspnea.    Sept. 23, 2022: [name] is seen for follow up of his CAD Hx of CABG in 2020  Working out regularly   Nov. 10, 2023  [name] is seen today for follow up visit  Hx of CAD, severe CAD , CABG BP has been a little   Is on amlodipine 5 mg a day  Has not tried valsartan to his memory   Will DC amlodipne and try Valsartan 160 mg a day  . Avoids salt and salty foods.   Wt is 221 lbs      June 18, 2023  [name] is seen for follow up of new dx atrial fib. Hx of CAD,   Is still in atrial flutter  On Eliquis for 1 month     Aug. 12, 2024 [name] is seen for follow up of his atrial flutter  He was successfully cardioverted on July 19 Feeling much better after cardioversion.  He is continues to exercise.  He eats a careful low-salt low-fat diet. Seems to be tolerating the Eliquis quite well.  No Cp , no dyspnea     Past Medical History:  Diagnosis Date   Arthritis    OA AND PAIN BOTH KNEES   Asthma    AS A CHILD   Chronic knee pain    Coronary artery disease    a. PTCA/stenting ramus inter. vess.; 10/2008,  b. cardiac arrest 06/03/2019, CABG X3 LIMA-LAD, SVG-RI, SVG-Circ 06/08/2019   GERD (gastroesophageal reflux disease)    MILD--WOULD TAKE ROLAID IF NEEDED   Hernia    "  AT MY BELLY BUTTON" - NO PAIN OR PROBLEMS   Hyperlipidemia    Hypertension    Premature atrial contractions    Cardiac monitor 03/2020: NSR, rare PVCs, 3 beat run SVT, rare PVCs   Restless leg syndrome    Seasonal allergies    FREQENT BRONCHITIS. SINUS INFECTIONS WITH SEASON CHANGES   Shingles DEC 2012   MILD CASE-NO RESIDUAL PROBLEMS   Sleep apnea 07/06/12   STOP BANG SCORE OF 4    Past Surgical History:  Procedure Laterality Date   CARDIAC CATHETERIZATION     10/30/2008;  PTCA/stent ramus interm. vess.   CARDIOVERSION N/A 06/25/2023   Procedure: CARDIOVERSION;  Surgeon: Wendall Stade, MD;  Location: [facility];  Service: Cardiovascular;  Laterality: N/A;   CORONARY ANGIOPLASTY     CORONARY ARTERY BYPASS GRAFT N/A 06/08/2019   Procedure: CORONARY ARTERY BYPASS GRAFTING (CABG), ON PUMP, TIMES THREE, USING LEFT INTERNAL MAMMARY ARTERY AND ENDOSCOPICALLY HARVESTED LEFT GREATER SAPHENOUS VEIN;  Surgeon: Kerin Perna, MD;  Location: [facility] OR;  Service: Open Heart Surgery;  Laterality: N/A;   CORONARY/GRAFT ACUTE MI REVASCULARIZATION N/A 06/04/2019   Procedure: Coronary/Graft Acute  MI Revascularization;  Surgeon: Kathleene Hazel, MD;  Location: [facility];  Service: Cardiovascular;  Laterality: N/A;   JOINT REPLACEMENT     KNEE SURGERY     right and left   LEFT HEART CATH AND CORONARY ANGIOGRAPHY N/A 06/04/2019   Procedure: LEFT HEART CATH AND CORONARY ANGIOGRAPHY;  Surgeon: Kathleene Hazel, MD;  Location: [facility];  Service: Cardiovascular;  Laterality: N/A;   TEE WITHOUT CARDIOVERSION N/A 06/08/2019   Procedure: TRANSESOPHAGEAL ECHOCARDIOGRAM (TEE);  Surgeon: Donata Clay, Theron Arista, MD;  Location: [facility] OR;  Service: Open Heart Surgery;  Laterality: N/A;   TOTAL KNEE ARTHROPLASTY  07/26/2012   Procedure: TOTAL KNEE ARTHROPLASTY;  Surgeon: [name], MD;  Location: [facility] ORS;  Service: Orthopedics;  Laterality: Left;   TOTAL KNEE ARTHROPLASTY  10/25/2012   Procedure: TOTAL KNEE ARTHROPLASTY;  Surgeon: [name], MD;  Location: [facility] ORS;  Service: Orthopedics;  Laterality: Right;     Current Outpatient Medications  Medication Sig Dispense Refill   ALPRAZolam (XANAX) 0.25 MG tablet Take 0.25 mg by mouth at bedtime as needed for sleep.     apixaban (ELIQUIS) 5 MG TABS tablet Take 1 tablet (5 mg total) by mouth 2 (two) times daily. 180 tablet 3   atorvastatin (LIPITOR) 80 MG tablet Take 80 mg by mouth daily.     B Complex-C (B-COMPLEX WITH VITAMIN C) tablet Take 1 tablet by mouth daily.     clotrimazole (LOTRIMIN) 1 % cream Apply 1 Application topically as needed (rash).     Cyanocobalamin (B-12) 2500 MCG TABS Take 2,500 mcg by mouth daily.     diphenhydrAMINE (BENADRYL) 25 MG tablet Take 25 mg by mouth daily as needed for allergies.      famotidine (PEPCID) 20 MG tablet Take 20 mg by mouth 2 (two) times daily.     fluticasone (FLONASE) 50 MCG/ACT nasal spray Place 1 spray into both nostrils at bedtime as needed for allergies or rhinitis.     Guaifenesin (MUCINEX MAXIMUM STRENGTH) 1200 MG TB12 Take 600 mg by mouth as needed (cold or cough).      Melatonin 12 MG TABS Take 12 mg by mouth 2 (two) times a week. Monday and Wednesday nights     metoprolol tartrate (LOPRESSOR) 25 MG tablet Take 1 tablet (25  mg total) by mouth 2 (two) times daily. 180 tablet 3   Naphazoline-Pheniramine (VISINE OP) Place 1 drop into both eyes daily as needed (dry eyes).     nitroGLYCERIN (NITROSTAT) 0.4 MG SL tablet Dissolve 1 tablet under the tongue every 5 minutes as needed for chest pain. Max of 3 doses, then 911. 25 tablet 6   valsartan (DIOVAN) 160 MG tablet Take 1 tablet (160 mg total) by mouth daily. (Patient taking differently: Take 160 mg by mouth at bedtime.) 90 tablet 3   zinc gluconate 50 MG tablet Take 25 mg by mouth daily.      No current facility-administered medications for this visit.    Allergies:   Clarithromycin    Social History:  The patient  reports that he has never smoked. He has quit using smokeless tobacco. He reports current alcohol use. He reports that he does not use drugs.   Family History:  The patient's family history includes CAD in his father; COPD in his father; Hypertension in his mother.    ROS:  Please see the history of present illness.     Physical Exam: Blood pressure 120/68, pulse (!) 53, height 6\' 3"  (1.905 m), weight 211 lb (95.7 kg), SpO2 98%.       GEN:  Well nourished, well developed in no acute distress HEENT: Normal NECK: No JVD; No carotid bruits LYMPHATICS: No lymphadenopathy CARDIAC: RRR , no murmurs, rubs, gallops RESPIRATORY:  Clear to auscultation without rales, wheezing or rhonchi  ABDOMEN: Soft, non-tender, non-distended MUSCULOSKELETAL:  No edema; No deformity  SKIN: Warm and dry NEUROLOGIC:  Alert and oriented x 3   EKG:  EKG Interpretation Date/Time:  Friday June 18 2023 13:22:40 EDT Ventricular Rate:  59 PR Interval:    QRS Duration:  142 QT Interval:  472 QTC Calculation: 467 R Axis:   90  Text Interpretation:   Suspect arm lead reversal, interpretation assumes no reversal  Atrial flutter with 4:1 A-V conduction Right bundle branch block When compared with ECG of 14-Jul-2019 12:49, PREVIOUS ECG IS PRESENT Confirmed by Kristeen Miss [phone]) on 06/18/2023 1:34:59 PM     Recent Labs: 10/16/2022: ALT 47 06/18/2023: BUN 15; Creatinine, Ser 0.88; Hemoglobin 14.0; Platelets 148; Potassium 4.8; Sodium 131    Lipid Panel    Component Value Date/Time   CHOL 141 10/16/2022 0956   TRIG 153 (H) 10/16/2022 0956   HDL 57 10/16/2022 0956   CHOLHDL 2.5 10/16/2022 0956   CHOLHDL 2.5 06/04/2019 1445   VLDL 23 06/04/2019 1445   LDLCALC 58 10/16/2022 0956      Wt Readings from Last 3 Encounters:  07/19/23 211 lb (95.7 kg)  06/25/23 214 lb (97.1 kg)  06/18/23 215 lb (97.5 kg)      Other studies Reviewed: Additional studies/ records that were reviewed today include: . Review of the above records demonstrates:   ASSESSMENT AND PLAN:   1. Coronary artery disease-status post 2.5 x 18 mm Promus stent positioned in the ramus intermediate vessel (10/29/08)  S/p cardiac arrest in June, 2020 .   Cath showed 3 V CAD Had CABG 06/08/2019.   He denies any angina.  He exercises regularly.     2. Atrail flutter:    Continue Eliquis.  He is status post successful cardioversion.       3. RBBB -       4.  Hypertension:    Blood pressure is well-controlled.   5.  Hyperlipidemia :   His labs from  last year looked good.  He had labs with Dr. Su Hilt several months ago.  Will get them faxed in for our records   Current medicines are reviewed at length with the patient today.  The patient does not have concerns regarding medicines.  The following changes have been made:  no change  Labs/ tests ordered today include:   No orders of the defined types were placed in this encounter.     Disposition:       Signed, Kristeen Miss, MD  07/19/2023 10:34 AM    [facility] [address] [place], [place], [place]  [address] Phone: [phone]; Fax:  [phone]

## 2023-07-19 ENCOUNTER — Ambulatory Visit: Payer: PPO | Admitting: Cardiovascular Disease

## 2023-07-19 ENCOUNTER — Encounter: Payer: Self-pay | Admitting: Cardiovascular Disease

## 2023-07-19 VITALS — BP 120/68 | HR 53 | Ht 75.0 in | Wt 211.0 lb

## 2023-07-19 DIAGNOSIS — E782 Mixed hyperlipidemia: Secondary | ICD-10-CM

## 2023-07-19 DIAGNOSIS — I2510 Atherosclerotic heart disease of native coronary artery without angina pectoris: Secondary | ICD-10-CM | POA: Diagnosis not present

## 2023-07-19 DIAGNOSIS — I4892 Unspecified atrial flutter: Secondary | ICD-10-CM

## 2023-07-19 NOTE — Patient Instructions (Signed)
Medication Instructions:  Your physician recommends that you continue on your current medications as directed. Please refer to the Current Medication list given to you today.  *If you need a refill on your cardiac medications before your next appointment, please call your pharmacy*   Lab Work: None ordered  If you have labs (blood work) drawn today and your tests are completely normal, you will receive your results only by: MyChart Message (if you have MyChart) OR A paper copy in the mail If you have any lab test that is abnormal or we need to change your treatment, we will call you to review the results.   Testing/Procedures: None ordered   Follow-Up: At [facility], you and your health needs are our priority.  As part of our continuing mission to provide you with exceptional heart care, we have created designated Provider Care Teams.  These Care Teams include your primary Cardiologist (physician) and Advanced Practice Providers (APPs -  Physician Assistants and Nurse Practitioners) who all work together to provide you with the care you need, when you need it.  We recommend signing up for the patient portal called "MyChart".  Sign up information is provided on this After Visit Summary.  MyChart is used to connect with patients for Virtual Visits (Telemedicine).  Patients are able to view lab/test results, encounter notes, upcoming appointments, etc.  Non-urgent messages can be sent to your provider as well.   To learn more about what you can do with MyChart, go to [email or web address].    Your next appointment:   12 month(s)  Provider:   Philip Nahser, MD     Other Instructions  

## 2023-10-04 ENCOUNTER — Other Ambulatory Visit: Payer: Self-pay | Admitting: Cardiovascular Disease

## 2023-10-04 DIAGNOSIS — I10 Essential (primary) hypertension: Secondary | ICD-10-CM

## 2023-10-04 DIAGNOSIS — E782 Mixed hyperlipidemia: Secondary | ICD-10-CM

## 2023-10-04 DIAGNOSIS — I2510 Atherosclerotic heart disease of native coronary artery without angina pectoris: Secondary | ICD-10-CM

## 2023-10-04 DIAGNOSIS — Z79899 Other long term (current) drug therapy: Secondary | ICD-10-CM

## 2023-10-05 ENCOUNTER — Other Ambulatory Visit: Payer: Self-pay | Admitting: Cardiovascular Disease

## 2024-05-03 ENCOUNTER — Other Ambulatory Visit: Payer: Self-pay | Admitting: Cardiovascular Disease

## 2024-05-03 DIAGNOSIS — I4892 Unspecified atrial flutter: Secondary | ICD-10-CM

## 2024-05-03 NOTE — Telephone Encounter (Signed)
 Pt last saw Dr Alroy Aspen 07/19/23, last labs 06/18/23 Creat 0.88, age 78, weight 95.7kg, based on specified criteria pt is on appropriate dosage of Eliquis  5mg  BID for aflutter.  Will refill rx.

## 2024-07-14 ENCOUNTER — Other Ambulatory Visit (HOSPITAL_COMMUNITY): Payer: Self-pay

## 2024-07-14 ENCOUNTER — Encounter: Payer: Self-pay | Admitting: Cardiology

## 2024-07-14 ENCOUNTER — Ambulatory Visit: Attending: Cardiology | Admitting: Cardiology

## 2024-07-14 VITALS — BP 134/64 | HR 52 | Ht 75.0 in | Wt 225.0 lb

## 2024-07-14 DIAGNOSIS — I483 Typical atrial flutter: Secondary | ICD-10-CM | POA: Diagnosis not present

## 2024-07-14 DIAGNOSIS — I10 Essential (primary) hypertension: Secondary | ICD-10-CM | POA: Diagnosis not present

## 2024-07-14 DIAGNOSIS — E782 Mixed hyperlipidemia: Secondary | ICD-10-CM | POA: Diagnosis not present

## 2024-07-14 DIAGNOSIS — Z79899 Other long term (current) drug therapy: Secondary | ICD-10-CM

## 2024-07-14 DIAGNOSIS — I2510 Atherosclerotic heart disease of native coronary artery without angina pectoris: Secondary | ICD-10-CM | POA: Diagnosis not present

## 2024-07-14 MED ORDER — APIXABAN 5 MG PO TABS
5.0000 mg | ORAL_TABLET | Freq: Two times a day (BID) | ORAL | 3 refills | Status: DC
  Filled 2024-07-14: qty 60, 30d supply, fill #0

## 2024-07-14 MED ORDER — NITROGLYCERIN 0.4 MG SL SUBL
SUBLINGUAL_TABLET | SUBLINGUAL | 3 refills | Status: AC
Start: 2024-07-14 — End: ?
  Filled 2024-07-14: qty 25, 8d supply, fill #0

## 2024-07-14 MED ORDER — VALSARTAN 160 MG PO TABS
160.0000 mg | ORAL_TABLET | Freq: Every day | ORAL | 3 refills | Status: DC
Start: 2024-07-14 — End: 2024-09-25
  Filled 2024-07-14: qty 30, 30d supply, fill #0

## 2024-07-14 MED ORDER — ATORVASTATIN CALCIUM 80 MG PO TABS
80.0000 mg | ORAL_TABLET | Freq: Every day | ORAL | 3 refills | Status: AC
  Filled 2024-07-14: qty 90, 90d supply, fill #0

## 2024-07-14 MED ORDER — METOPROLOL TARTRATE 25 MG PO TABS
25.0000 mg | ORAL_TABLET | Freq: Two times a day (BID) | ORAL | 3 refills | Status: DC
  Filled 2024-07-14: qty 180, 90d supply, fill #0

## 2024-07-14 NOTE — Progress Notes (Signed)
 Cardiology Office Note:  .   Date:  07/14/2024  ID:  [name], DOB 03-Oct-1946, MRN [id number] PCP: Henry Ingle, MD  [facility] Providers Cardiologist:  Newman Lawrence, MD PCP: Henry Ingle, MD  Chief Complaint  Patient presents with   Coronary Artery Disease   Hypertension     [name] is a 78 y.o. male with hypertension, hyperlipidemia, CAD s/p CABG X 3 (LIMA-LAD, SVG-RI, SVG-Lcx) in 2020, paroxysmal atrial flutter  History of Present Illness  Patient was last seen by Dr. Robby in 07/2023.  Patient presented in 2020 with cardiac arrest, was found to have severe multivessel CAD for which she underwent CABG.  Patient has had EKG documented atrial flutter in 11/2022 and 06/2023, last underwent cardioversion in 01/2023.  Patient is very active, exercises 2-3 times with light weights, as well as regular walking.  He denies any complaints of chest pain, shortness of breath.  He is never experienced palpitations, and did not feel them even when he was in atrial flutter.  He is tolerating Eliquis  without any significant bleeding issues.    Vitals:   07/14/24 0931  BP: 134/64  Pulse: (!) 52  SpO2: 98%      Review of Systems  Cardiovascular:  Negative for chest pain, dyspnea on exertion, leg swelling, palpitations and syncope.        Studies Reviewed: SABRA        EKG 07/14/2024: Sinus bradycardia with marked sinus arrhythmia with 1st degree A-V block Right bundle branch block When compared with ECG of 18-Jun-2023 13:22, Sinus rhythm has replaced Atrial flutter QT has shortened  ILabs 5-06/2023: HbA1C 6.2% Hb 14 Cr 0.8, Na 131   10/2022: Chol 141, TG 153, HDL 57, LDL 58  Echocardiogram 06/2023:  1. Left ventricular ejection fraction, by estimation, is 55%. The left  ventricle has normal function. The left ventricle has no regional wall  motion abnormalities. There is mild asymmetric left ventricular  hypertrophy of the basal-septal segment.  Left  ventricular diastolic parameters are indeterminate.   2. Right ventricular systolic function is mildly reduced. The right  ventricular size is mildly enlarged. There is normal pulmonary artery  systolic pressure. The estimated right ventricular systolic pressure is  32.8 mmHg.   3. Right atrial size was moderately dilated.   4. The mitral valve is normal in structure. Mild mitral valve  regurgitation. No evidence of mitral stenosis.   5. Tricuspid valve regurgitation is moderate.   6. The aortic valve is tricuspid. There is mild calcification of the  aortic valve. Aortic valve regurgitation is not visualized. No aortic  stenosis is present.   7. The inferior vena cava is normal in size with greater than 50%  respiratory variability, suggesting right atrial pressure of 3 mmHg.   8. The patient was atrial flutter.    Risk Assessment/Calculations:    CHA2DS2-VASc Score = 4   This indicates a 4.8% annual risk of stroke. The patient's score is based upon: CHF History: 0 HTN History: 1 Diabetes History: 0 Stroke History: 0 Vascular Disease History: 1 Age Score: 2 Gender Score: 0     Physical Exam Vitals and nursing note reviewed.  Constitutional:      General: He is not in acute distress. Neck:     Vascular: No JVD.  Cardiovascular:     Rate and Rhythm: Normal rate and regular rhythm.     Heart sounds: Normal heart sounds. No murmur heard. Pulmonary:  Effort: Pulmonary effort is normal.     Breath sounds: Normal breath sounds. No wheezing or rales.  Musculoskeletal:     Right lower leg: No edema.     Left lower leg: No edema.      VISIT DIAGNOSES:   ICD-10-CM   1. Coronary artery disease involving native coronary artery of native heart without angina pectoris  I25.10 EKG 12-Lead    nitroGLYCERIN  (NITROSTAT ) 0.4 MG SL tablet    valsartan  (DIOVAN ) 160 MG tablet    2. Mixed hyperlipidemia  E78.2 EKG 12-Lead    nitroGLYCERIN  (NITROSTAT ) 0.4 MG SL tablet     valsartan  (DIOVAN ) 160 MG tablet    3. Essential (primary) hypertension  I10 EKG 12-Lead    nitroGLYCERIN  (NITROSTAT ) 0.4 MG SL tablet    valsartan  (DIOVAN ) 160 MG tablet    4. Typical atrial flutter (HCC)  I48.3 apixaban  (ELIQUIS ) 5 MG TABS tablet    5. Medication management  Z79.899 nitroGLYCERIN  (NITROSTAT ) 0.4 MG SL tablet    valsartan  (DIOVAN ) 160 MG tablet       USBALDO Griffin is a 78 y.o. male with hypertension, hyperlipidemia, CAD s/p CABG X 3 (LIMA-LAD, SVG-RI, SVG-Lcx) in 2020, paroxysmal atrial flutter Assessment & Plan  CAD: S/p CABG.  No angina symptoms with excellent functional capacity. Not on aspirin  due to ongoing use of Eliquis  due to paroxysmal atrial flutter. Historically, lipids very well-controlled on Lipitor  80 mg daily, continue the same.  Paroxysmal atrial flutter: No new symptoms associated with it.  Therefore, it is hard to know if and when he has any atrial flutter episodes.  He does not have a smart watch.  He has tolerated Eliquis  without milligram twice daily any bleeding issues.  Therefore, continue the same.  After he turns 78, we will reevaluate his creatinine and weight to decide on the dose of Eliquis  thereafter.  Continue metoprolol  tartrate 25 mg twice daily.  Hypertension: Well-controlled on valsartan  60 mg daily, and metoprolol  tartrate 25 mg twice daily.      Meds ordered this encounter  Medications   apixaban  (ELIQUIS ) 5 MG TABS tablet    Sig: Take 1 tablet (5 mg total) by mouth 2 (two) times daily.    Dispense:  180 tablet    Refill:  3   atorvastatin  (LIPITOR ) 80 MG tablet    Sig: Take 1 tablet (80 mg total) by mouth daily.    Dispense:  90 tablet    Refill:  3   metoprolol  tartrate (LOPRESSOR ) 25 MG tablet    Sig: Take 1 tablet (25 mg total) by mouth 2 (two) times daily.    Dispense:  180 tablet    Refill:  3   nitroGLYCERIN  (NITROSTAT ) 0.4 MG SL tablet    Sig: Dissolve 1 tablet under the tongue every 5 minutes as needed for  chest pain. Max of 3 doses, then 911.    Dispense:  25 tablet    Refill:  3   valsartan  (DIOVAN ) 160 MG tablet    Sig: Take 1 tablet (160 mg total) by mouth daily.    Dispense:  90 tablet    Refill:  3     F/u in 1 year  Signed, Newman JINNY Lawrence, MD

## 2024-07-14 NOTE — Patient Instructions (Signed)
 Medication Instructions:  Refills sent in today  *If you need a refill on your cardiac medications before your next appointment, please call your pharmacy*  Follow-Up: At [facility], you and your health needs are our priority.  As part of our continuing mission to provide you with exceptional heart care, our providers are all part of one team.  This team includes your primary Cardiologist (physician) and Advanced Practice Providers or APPs (Physician Assistants and Nurse Practitioners) who all work together to provide you with the care you need, when you need it.  Your next appointment:   1 year(s)  Provider:   Cody Das, MD

## 2024-09-25 ENCOUNTER — Other Ambulatory Visit: Payer: Self-pay

## 2024-09-25 DIAGNOSIS — I10 Essential (primary) hypertension: Secondary | ICD-10-CM

## 2024-09-25 DIAGNOSIS — Z79899 Other long term (current) drug therapy: Secondary | ICD-10-CM

## 2024-09-25 DIAGNOSIS — E782 Mixed hyperlipidemia: Secondary | ICD-10-CM

## 2024-09-25 DIAGNOSIS — I2510 Atherosclerotic heart disease of native coronary artery without angina pectoris: Secondary | ICD-10-CM

## 2024-09-28 MED ORDER — VALSARTAN 160 MG PO TABS
160.0000 mg | ORAL_TABLET | Freq: Every day | ORAL | 3 refills | Status: AC
Start: 2024-09-28 — End: ?

## 2024-09-28 MED ORDER — METOPROLOL TARTRATE 25 MG PO TABS: 25.0000 mg | ORAL_TABLET | Freq: Two times a day (BID) | ORAL | 2 refills | Status: AC

## 2024-11-22 ENCOUNTER — Other Ambulatory Visit: Payer: Self-pay | Admitting: Cardiology

## 2024-11-22 DIAGNOSIS — I483 Typical atrial flutter: Secondary | ICD-10-CM

## 2024-11-22 NOTE — Telephone Encounter (Signed)
 Prescription refill request for Eliquis  received. Indication:aflutter Last office visit:8/25 Scr: needs labs Age:78 Weight:102.1  kg  Prescription refilled

## 2024-11-28 LAB — CBC WITH DIFFERENTIAL/PLATELET

## 2024-11-28 NOTE — Telephone Encounter (Signed)
 Pt going to get labs drawn today

## 2024-11-29 LAB — BASIC METABOLIC PANEL WITH GFR
BUN/Creatinine Ratio: 17 (ref 10–24)
BUN: 19 mg/dL (ref 8–27)
CO2: 24 mmol/L (ref 20–29)
Calcium: 9.5 mg/dL (ref 8.6–10.2)
Chloride: 100 mmol/L (ref 96–106)
Creatinine, Ser: 1.09 mg/dL (ref 0.76–1.27)
Glucose: 92 mg/dL (ref 70–99)
Potassium: 4.3 mmol/L (ref 3.5–5.2)
Sodium: 137 mmol/L (ref 134–144)
eGFR: 69 mL/min/1.73

## 2024-11-29 LAB — CBC WITH DIFFERENTIAL/PLATELET
Basophils Absolute: 0.1 x10E3/uL (ref 0.0–0.2)
Basos: 1 %
EOS (ABSOLUTE): 0.5 x10E3/uL — ABNORMAL HIGH (ref 0.0–0.4)
Eos: 8 %
Hematocrit: 40.3 % (ref 37.5–51.0)
Hemoglobin: 13.5 g/dL (ref 13.0–17.7)
Immature Grans (Abs): 0 x10E3/uL (ref 0.0–0.1)
Immature Granulocytes: 0 %
Lymphocytes Absolute: 1.4 x10E3/uL (ref 0.7–3.1)
Lymphs: 24 %
MCH: 33.3 pg — ABNORMAL HIGH (ref 26.6–33.0)
MCHC: 33.5 g/dL (ref 31.5–35.7)
MCV: 100 fL — ABNORMAL HIGH (ref 79–97)
Monocytes Absolute: 0.9 x10E3/uL (ref 0.1–0.9)
Monocytes: 14 %
Neutrophils Absolute: 3.3 x10E3/uL (ref 1.4–7.0)
Neutrophils: 53 %
Platelets: 161 x10E3/uL (ref 150–450)
RBC: 4.05 x10E6/uL — ABNORMAL LOW (ref 4.14–5.80)
RDW: 13 % (ref 11.6–15.4)
WBC: 6.1 x10E3/uL (ref 3.4–10.8)

## 2024-12-22 ENCOUNTER — Other Ambulatory Visit: Payer: Self-pay | Admitting: Cardiology

## 2024-12-22 DIAGNOSIS — I483 Typical atrial flutter: Secondary | ICD-10-CM

## 2024-12-22 NOTE — Telephone Encounter (Signed)
 Eliquis  5mg  refill request received. Patient is 79 years old, weight-102.1kg, Crea-1.09 on 11/28/24, Diagnosis-Aflutter, and last seen by Dr. El[place]ira on 07/14/24. Dose is appropriate based on dosing criteria. Will send in refill to requested pharmacy.
